# Patient Record
Sex: Female | Born: 1985 | Race: Black or African American | Hispanic: No | Marital: Single | Smoking: Former smoker
Health system: Southern US, Community
[De-identification: ages and names within clinical notes are randomized; demographics above are authoritative.]

## PROBLEM LIST (undated history)

## (undated) ENCOUNTER — Inpatient Hospital Stay (HOSPITAL_COMMUNITY): Payer: Self-pay

## (undated) DIAGNOSIS — J302 Other seasonal allergic rhinitis: Secondary | ICD-10-CM

## (undated) DIAGNOSIS — O139 Gestational [pregnancy-induced] hypertension without significant proteinuria, unspecified trimester: Secondary | ICD-10-CM

## (undated) DIAGNOSIS — M797 Fibromyalgia: Secondary | ICD-10-CM

## (undated) DIAGNOSIS — Z7289 Other problems related to lifestyle: Secondary | ICD-10-CM

## (undated) DIAGNOSIS — F419 Anxiety disorder, unspecified: Secondary | ICD-10-CM

## (undated) DIAGNOSIS — R569 Unspecified convulsions: Secondary | ICD-10-CM

## (undated) DIAGNOSIS — M1990 Unspecified osteoarthritis, unspecified site: Secondary | ICD-10-CM

## (undated) DIAGNOSIS — J40 Bronchitis, not specified as acute or chronic: Secondary | ICD-10-CM

## (undated) DIAGNOSIS — R51 Headache: Secondary | ICD-10-CM

## (undated) DIAGNOSIS — N12 Tubulo-interstitial nephritis, not specified as acute or chronic: Secondary | ICD-10-CM

## (undated) DIAGNOSIS — T391X1A Poisoning by 4-Aminophenol derivatives, accidental (unintentional), initial encounter: Secondary | ICD-10-CM

## (undated) DIAGNOSIS — R519 Headache, unspecified: Secondary | ICD-10-CM

## (undated) HISTORY — PX: DILATION AND CURETTAGE OF UTERUS: SHX78

## (undated) HISTORY — PX: OTHER SURGICAL HISTORY: SHX169

---
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
## 2006-01-13 ENCOUNTER — Emergency Department (HOSPITAL_COMMUNITY): Admission: EM | Admit: 2006-01-13 | Discharge: 2006-01-14 | Payer: Self-pay | Admitting: Emergency Medicine

## 2006-11-21 ENCOUNTER — Emergency Department (HOSPITAL_COMMUNITY): Admission: EM | Admit: 2006-11-21 | Discharge: 2006-11-21 | Payer: Self-pay | Admitting: Emergency Medicine

## 2006-12-24 IMAGING — US US OB FOLLOW-UP
1 series · 13 of 28 positions shown · non-contrast
Comparison: none

CLINICAL DATA: Assess growth.

[Series 1: us ob follow-up · 0.37mm/px · 13 of 35 slices shown]
[im 2/35]
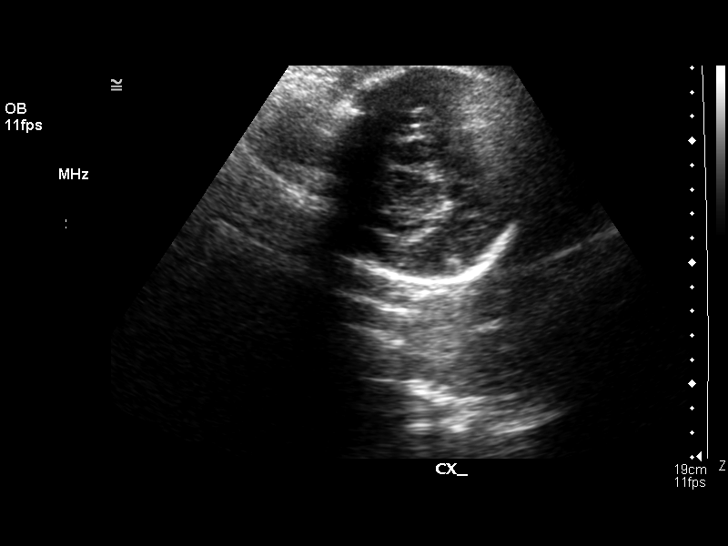
[im 4/35]
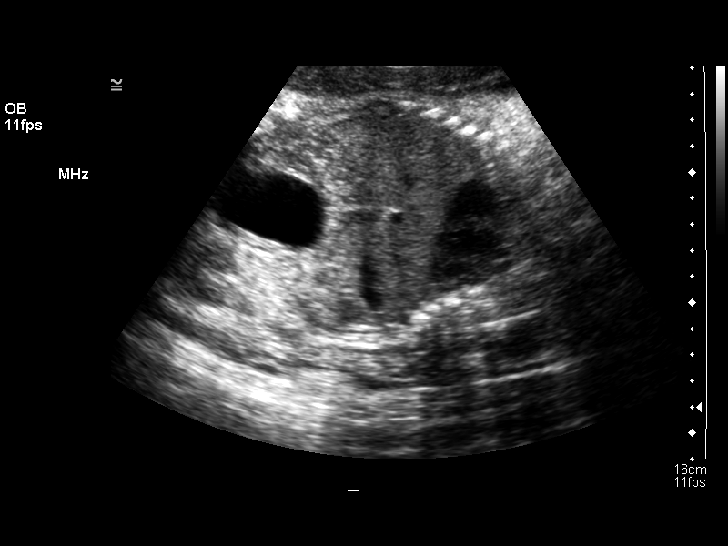
[im 7/35]
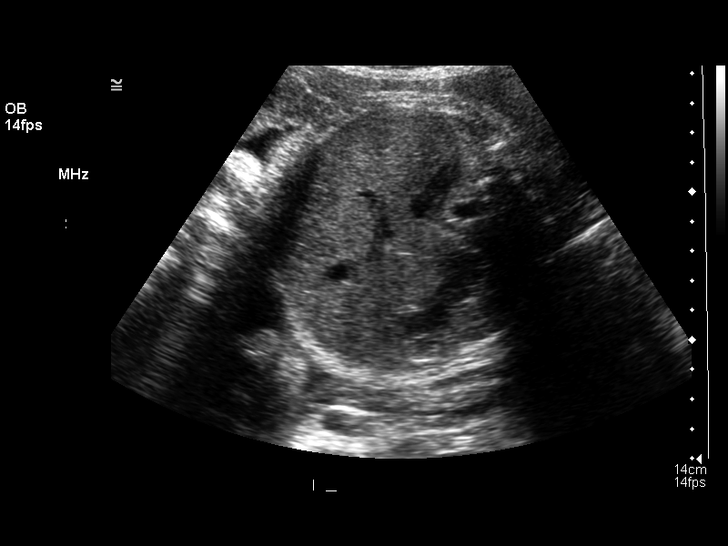
[im 9/35]
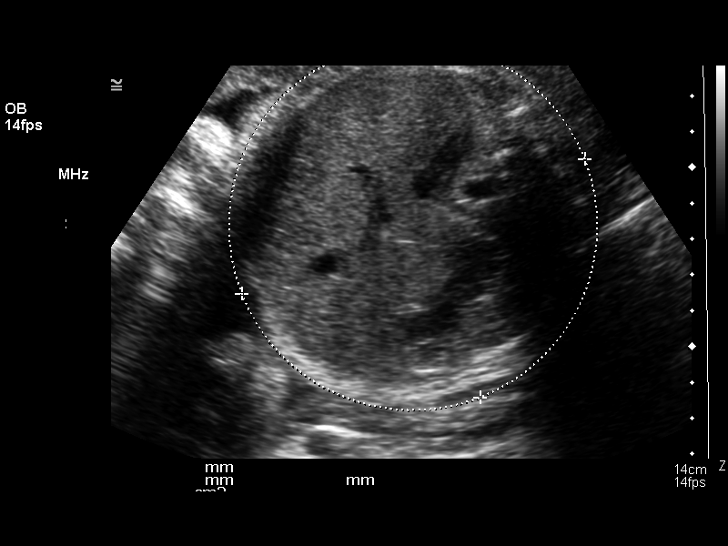
[im 12/35]
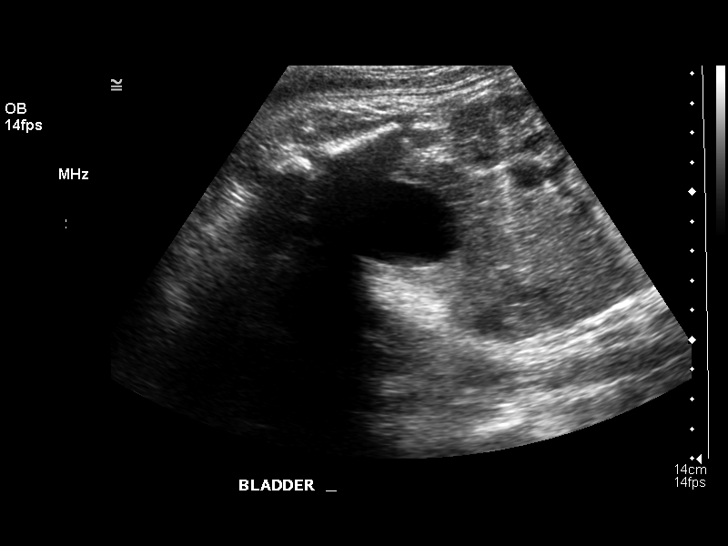
[im 14/35]
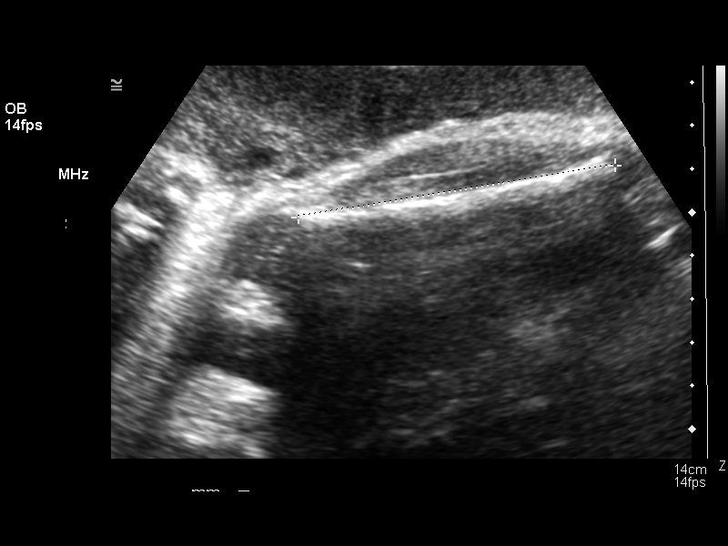
[im 18/35]
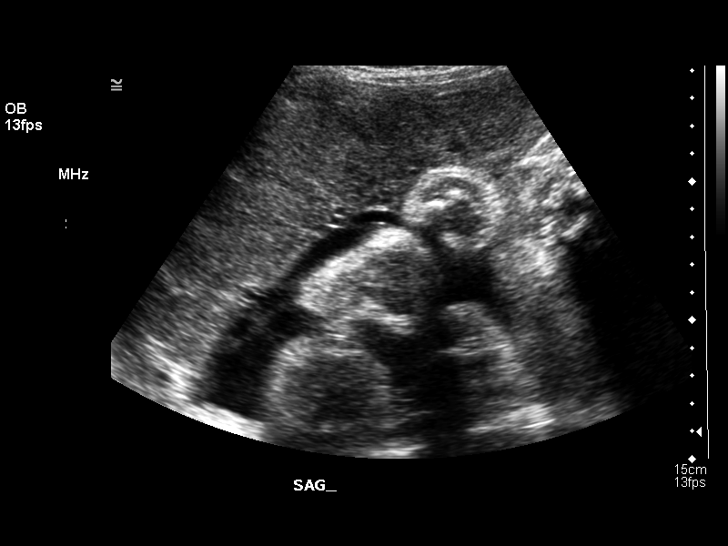
[im 21/35]
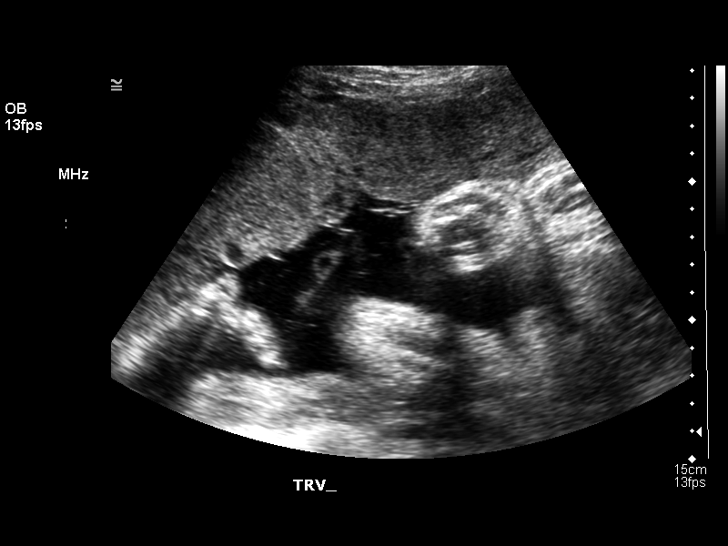
[im 23/35]
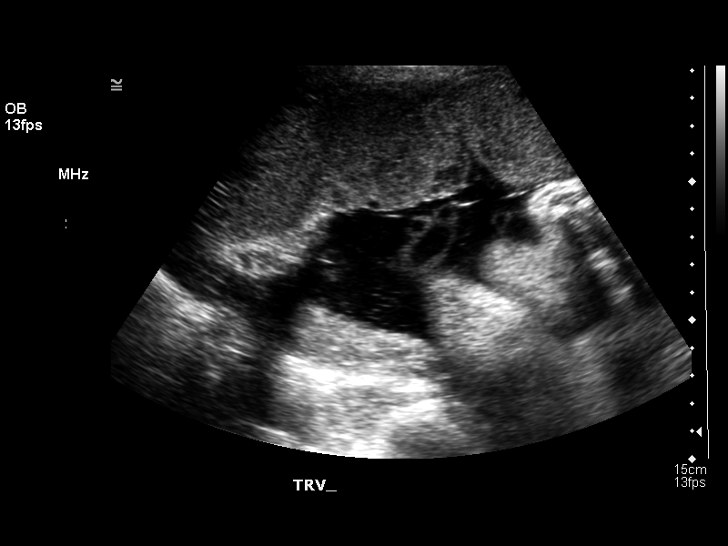
[im 26/35]
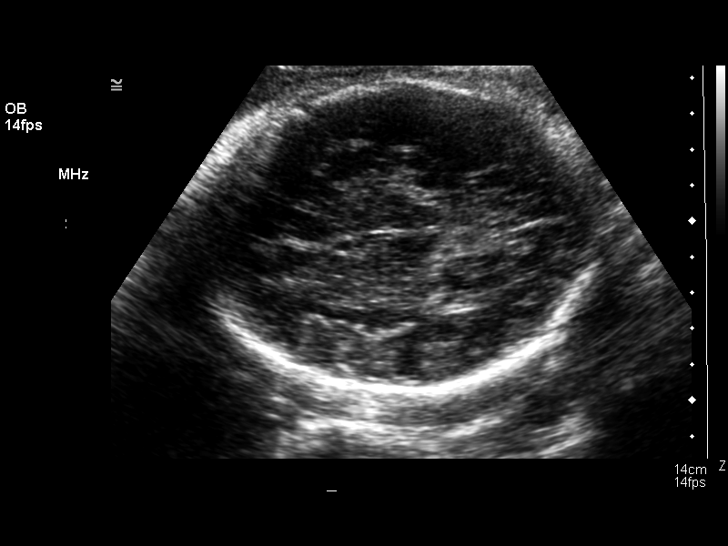
[im 28/35]
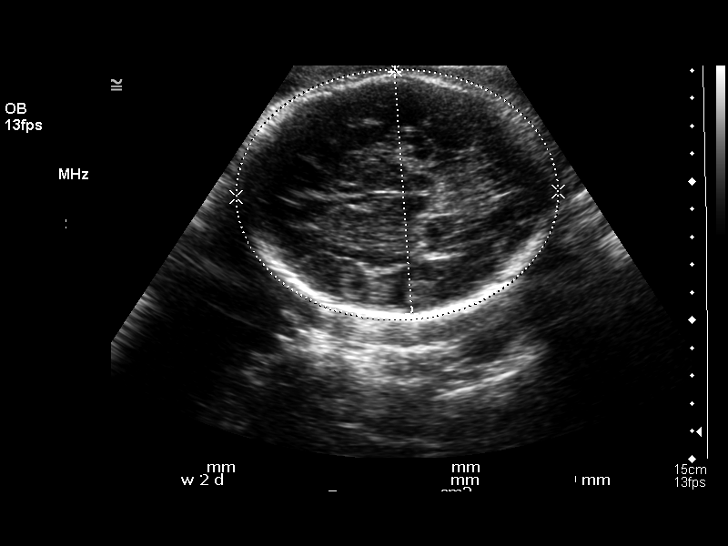
[im 31/35]
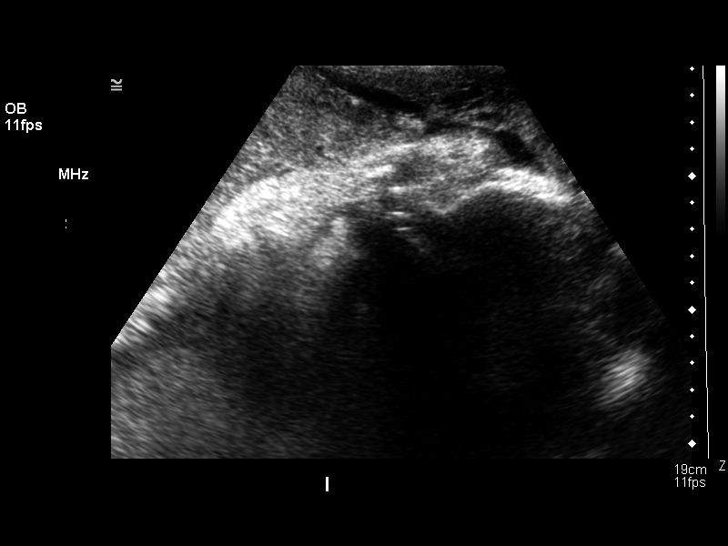
[im 33/35]
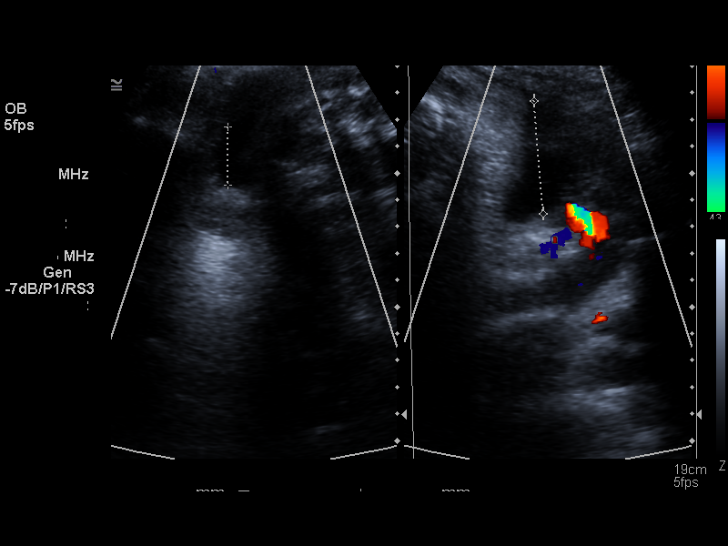

[13 of 28 positions shown; findings below may reference images not displayed]

OBSTETRICAL ULTRASOUND RE-EVALUATION:
Number of Fetuses:  1
Heart Rate:  147
Movement:  Yes
Breathing:  No
Presentation:  Cephalic
Placental Location:  Anterior
Grade:  II
Previa:  No
Amniotic Fluid (subjective):  Normal
Amniotic Fluid (objective):  14.1 cm AFI (5th -95th%ile = 7.7 ? 24.9 cm for 36 wks)

FETAL BIOMETRY
BPD:  8.7 cm   35 w 1 d
HC:  32.4 cm   36 w 4 d
AC:  32.2 cm   36 w 1 d
FL:  7.4 cm   37 w 6 d

Mean GA:  36 w 3 d
Assigned GA:  36 w 3 d

EFW:  0228 g (H) 75th ? 90th%ile (9999 ? 4349 g) For 36 wks

FETAL ANATOMY
Lateral Ventricles:  Visualized 
Thalami/CSP:  Visualized 
Posterior Fossa:  Previously seen 
Nuchal Region:  N/A
Spine:  Previously seen 
4 Chamber Heart on Left:  Previously seen 
Stomach on Left:  Visualized 
3 Vessel Cord:  Visualized 
Cord Insertion Site:  Previously seen 
Kidneys:  Visualized 
Bladder:  Visualized 
Extremities:  Previously seen 

MATERNAL UTERINE AND ADNEXAL FINDINGS
Cervix:  4.0 cm Transabdominally
IMPRESSION: 1.  Single intrauterine pregnancy demonstrating an estimated gestational age by ultrasound of 36 weeks and 3 days.  Correlation with assigned gestational age of 36 weeks and 3 days by office ultrasound suggests appropriate growth.  Currently the estimated fetal weight is just above the 75th percentile for a 36 week gestation.

2.  Subjectively and quantitatively normal amniotic fluid volume and normal cervical length.
3.  No late developing fetal anatomic abnormalities are identified associated with the lateral ventricles, stomach, kidneys or bladder.  A four chamber heart view could not be seen with clarity due to positioning on today?s exam.

## 2007-05-11 ENCOUNTER — Inpatient Hospital Stay (HOSPITAL_COMMUNITY): Admission: AD | Admit: 2007-05-11 | Discharge: 2007-05-11 | Payer: Self-pay | Admitting: Obstetrics and Gynecology

## 2007-07-15 ENCOUNTER — Emergency Department (HOSPITAL_COMMUNITY): Admission: EM | Admit: 2007-07-15 | Discharge: 2007-07-15 | Payer: Self-pay | Admitting: Emergency Medicine

## 2007-09-20 ENCOUNTER — Ambulatory Visit (HOSPITAL_COMMUNITY): Admission: RE | Admit: 2007-09-20 | Discharge: 2007-09-20 | Payer: Self-pay | Admitting: Obstetrics

## 2007-10-26 ENCOUNTER — Ambulatory Visit (HOSPITAL_COMMUNITY): Admission: RE | Admit: 2007-10-26 | Discharge: 2007-10-26 | Payer: Self-pay | Admitting: Obstetrics

## 2007-11-21 ENCOUNTER — Inpatient Hospital Stay (HOSPITAL_COMMUNITY): Admission: AD | Admit: 2007-11-21 | Discharge: 2007-11-22 | Payer: Self-pay | Admitting: Obstetrics

## 2008-05-13 ENCOUNTER — Emergency Department (HOSPITAL_COMMUNITY): Admission: EM | Admit: 2008-05-13 | Discharge: 2008-05-13 | Payer: Self-pay | Admitting: Emergency Medicine

## 2008-08-13 ENCOUNTER — Inpatient Hospital Stay (HOSPITAL_COMMUNITY): Admission: AD | Admit: 2008-08-13 | Discharge: 2008-08-13 | Payer: Self-pay | Admitting: Obstetrics & Gynecology

## 2008-12-12 ENCOUNTER — Ambulatory Visit: Payer: Self-pay | Admitting: Family Medicine

## 2008-12-12 ENCOUNTER — Encounter (INDEPENDENT_AMBULATORY_CARE_PROVIDER_SITE_OTHER): Payer: Self-pay | Admitting: *Deleted

## 2008-12-13 ENCOUNTER — Ambulatory Visit: Payer: Self-pay | Admitting: Family Medicine

## 2008-12-13 ENCOUNTER — Encounter: Payer: Self-pay | Admitting: Family Medicine

## 2008-12-13 ENCOUNTER — Ambulatory Visit (HOSPITAL_COMMUNITY): Admission: AD | Admit: 2008-12-13 | Discharge: 2008-12-13 | Payer: Self-pay | Admitting: Obstetrics and Gynecology

## 2008-12-13 ENCOUNTER — Encounter: Payer: Self-pay | Admitting: *Deleted

## 2008-12-20 ENCOUNTER — Encounter: Payer: Self-pay | Admitting: Family Medicine

## 2008-12-20 ENCOUNTER — Ambulatory Visit: Payer: Self-pay | Admitting: Family Medicine

## 2008-12-20 DIAGNOSIS — R569 Unspecified convulsions: Secondary | ICD-10-CM

## 2008-12-20 LAB — CONVERTED CEMR LAB
Beta hcg, urine, semiquantitative: POSITIVE
Chlamydia, DNA Probe: NEGATIVE
Cholesterol: 156 mg/dL (ref 0–200)
LDL Cholesterol: 92 mg/dL (ref 0–99)
Triglycerides: 48 mg/dL (ref <150)
VLDL: 10 mg/dL (ref 0–40)
Whiff Test: NEGATIVE

## 2008-12-23 ENCOUNTER — Encounter: Payer: Self-pay | Admitting: Family Medicine

## 2009-01-02 ENCOUNTER — Encounter: Payer: Self-pay | Admitting: Family Medicine

## 2009-01-22 ENCOUNTER — Telehealth: Payer: Self-pay | Admitting: *Deleted

## 2009-01-30 ENCOUNTER — Encounter: Payer: Self-pay | Admitting: *Deleted

## 2009-03-20 ENCOUNTER — Emergency Department (HOSPITAL_COMMUNITY): Admission: EM | Admit: 2009-03-20 | Discharge: 2009-03-20 | Payer: Self-pay | Admitting: Emergency Medicine

## 2009-03-31 ENCOUNTER — Emergency Department (HOSPITAL_COMMUNITY): Admission: EM | Admit: 2009-03-31 | Discharge: 2009-03-31 | Payer: Self-pay | Admitting: Emergency Medicine

## 2009-04-06 ENCOUNTER — Emergency Department (HOSPITAL_COMMUNITY): Admission: EM | Admit: 2009-04-06 | Discharge: 2009-04-06 | Payer: Self-pay | Admitting: Emergency Medicine

## 2009-05-24 ENCOUNTER — Encounter (INDEPENDENT_AMBULATORY_CARE_PROVIDER_SITE_OTHER): Payer: Self-pay | Admitting: *Deleted

## 2009-05-24 DIAGNOSIS — F17200 Nicotine dependence, unspecified, uncomplicated: Secondary | ICD-10-CM | POA: Insufficient documentation

## 2009-06-09 ENCOUNTER — Ambulatory Visit: Payer: Self-pay | Admitting: Family Medicine

## 2009-06-09 LAB — CONVERTED CEMR LAB: Beta hcg, urine, semiquantitative: NEGATIVE

## 2009-08-26 ENCOUNTER — Ambulatory Visit: Payer: Self-pay | Admitting: Family Medicine

## 2009-08-26 ENCOUNTER — Encounter: Payer: Self-pay | Admitting: Family Medicine

## 2009-08-26 LAB — CONVERTED CEMR LAB
Beta hcg, urine, semiquantitative: NEGATIVE
Chlamydia, DNA Probe: NEGATIVE

## 2009-09-07 IMAGING — US US OB FOLLOW-UP
1 series · 14 of 24 positions shown · non-contrast
Comparison: none

OBSTETRICAL ULTRASOUND:
 This ultrasound was performed in The [HOSPITAL], and the [facility] report will be stored to [REDACTED] PACS.

[Series 1: us ob follow-up · 14 of 24 slices shown]
[im 1/24]
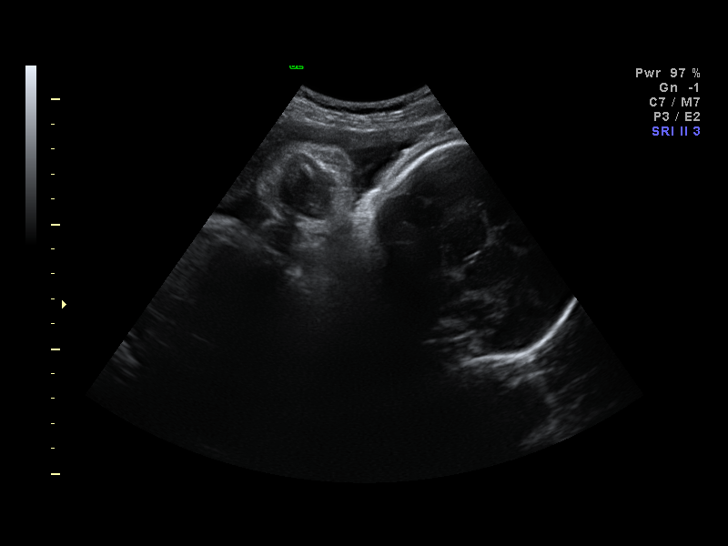
[im 3/24]
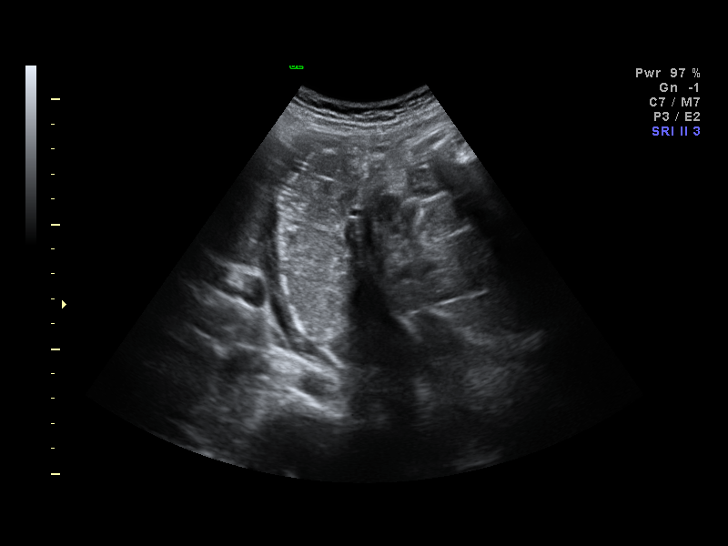
[im 5/24]
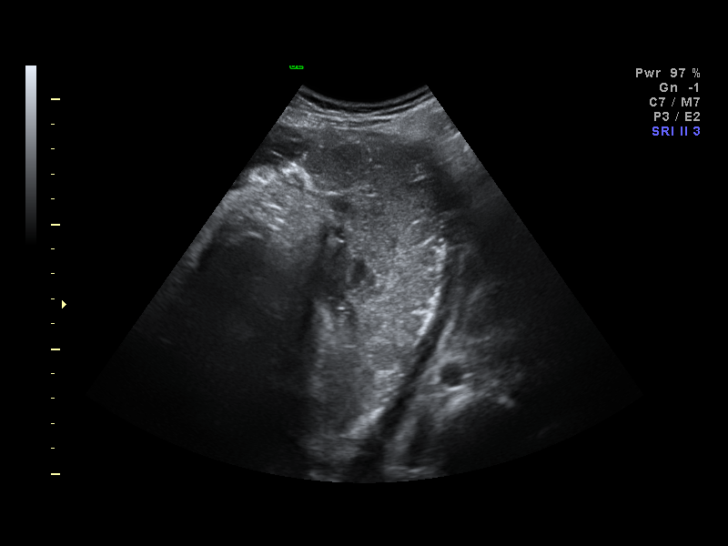
[im 7/24]
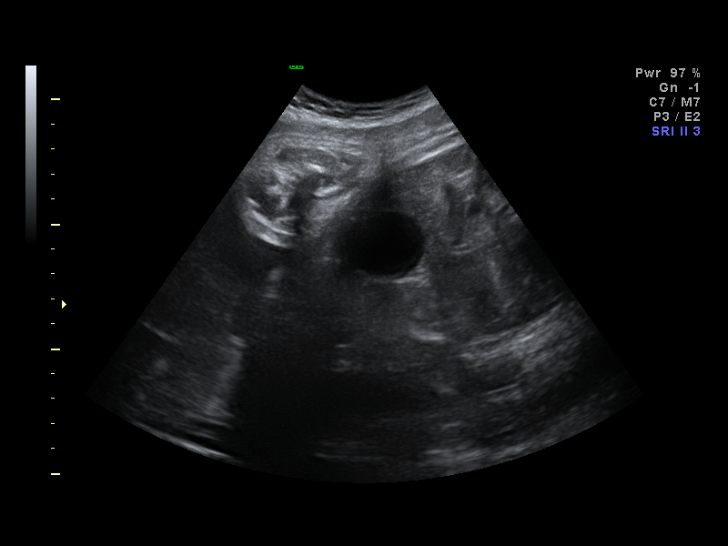
[im 8/24]
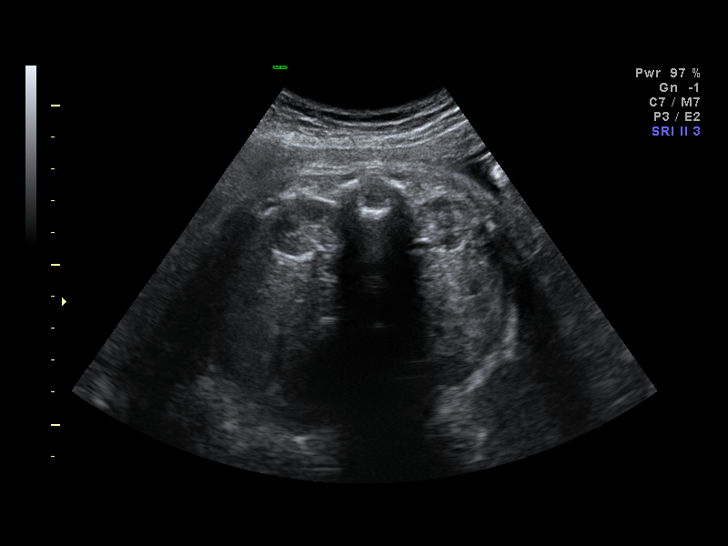
[im 10/24]
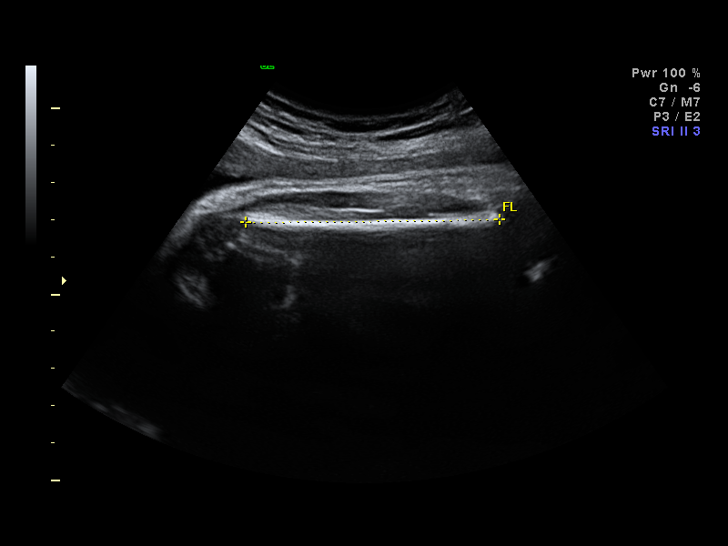
[im 12/24]
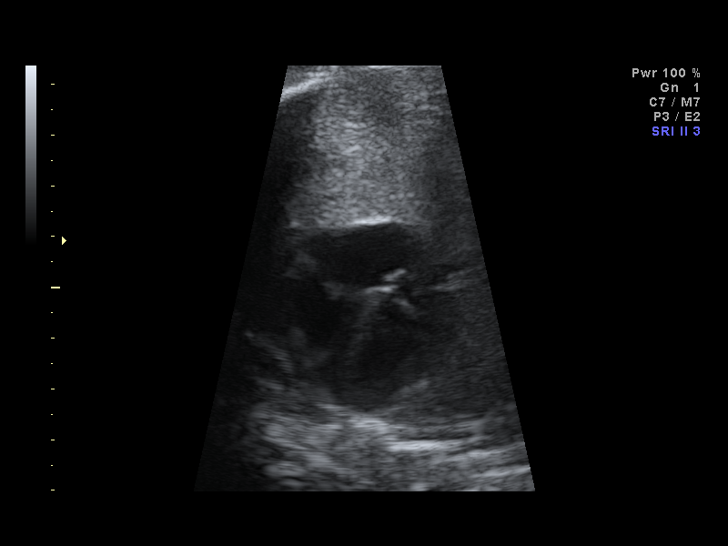
[im 13/24]
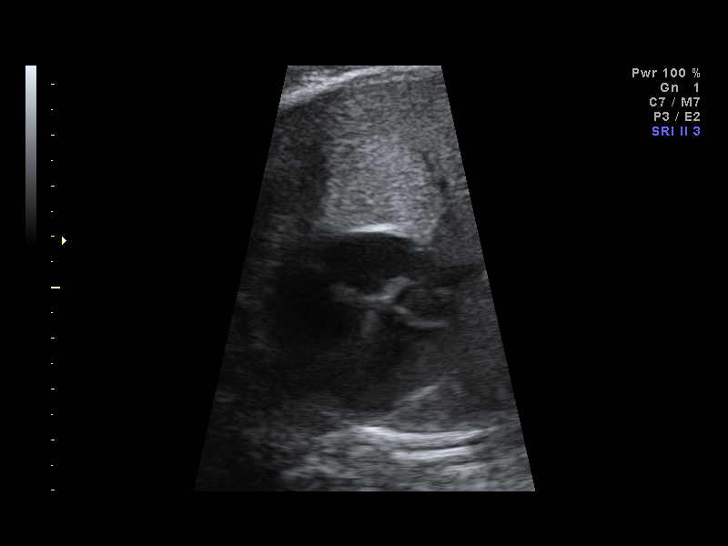
[im 15/24]
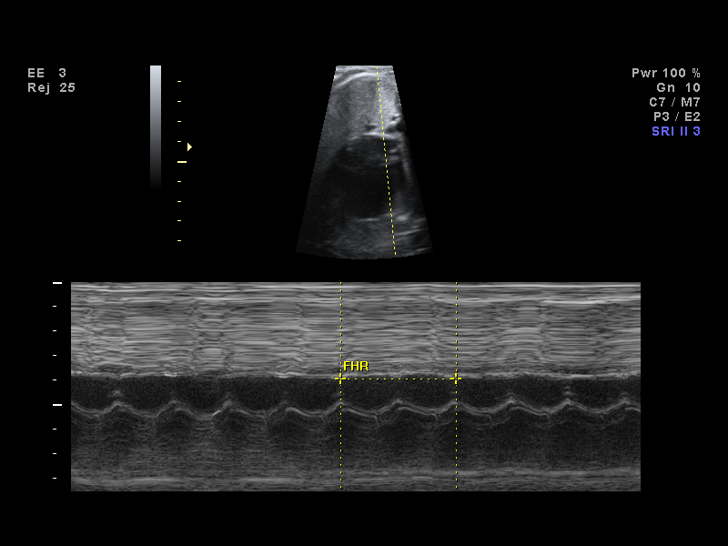
[im 17/24]
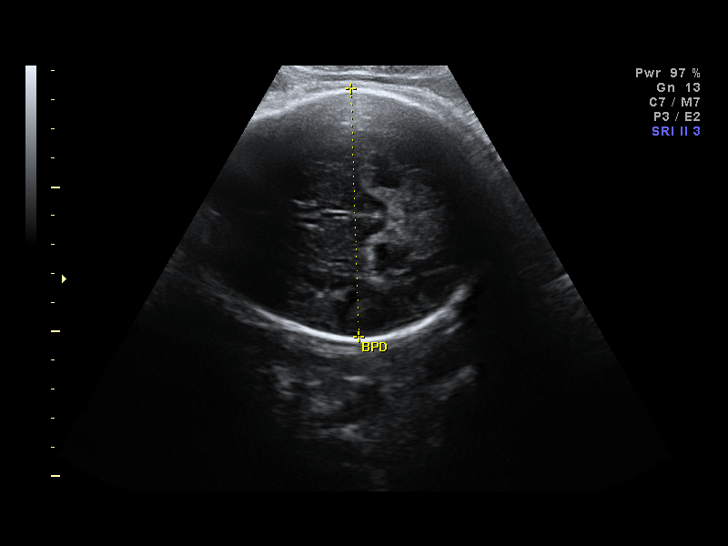
[im 19/24]
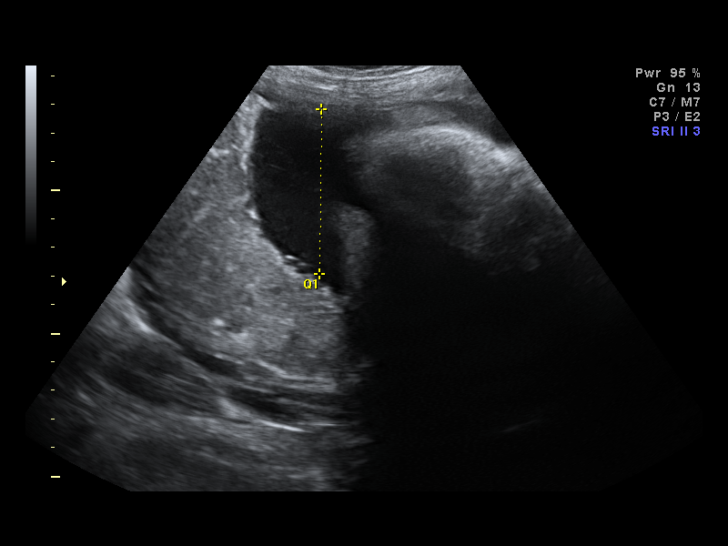
[im 20/24]
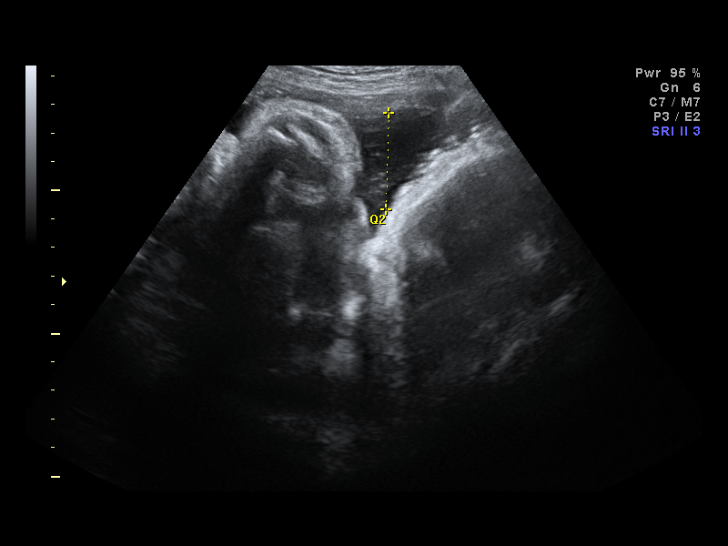
[im 22/24]
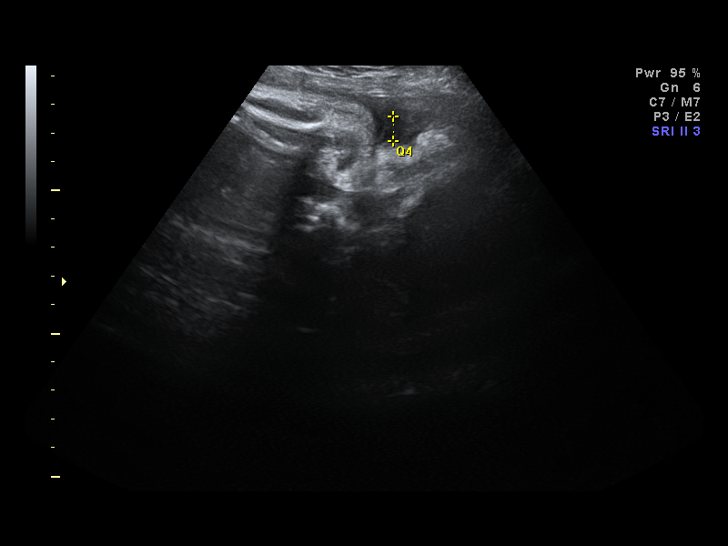
[im 24/24]
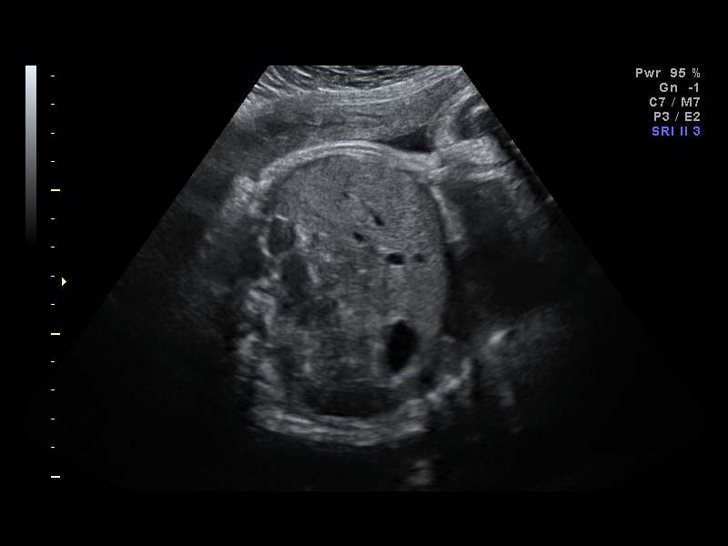

[14 of 24 positions shown; findings below may reference images not displayed]

IMPRESSION: The [facility] report has also been faxed to the ordering physician.

## 2009-10-10 ENCOUNTER — Encounter: Payer: Self-pay | Admitting: Family Medicine

## 2009-10-14 ENCOUNTER — Emergency Department (HOSPITAL_COMMUNITY): Admission: EM | Admit: 2009-10-14 | Discharge: 2009-10-14 | Payer: Self-pay | Admitting: Emergency Medicine

## 2009-11-10 ENCOUNTER — Telehealth: Payer: Self-pay | Admitting: Family Medicine

## 2009-11-26 ENCOUNTER — Ambulatory Visit: Payer: Self-pay | Admitting: Family Medicine

## 2009-11-26 DIAGNOSIS — R519 Headache, unspecified: Secondary | ICD-10-CM | POA: Insufficient documentation

## 2009-11-26 DIAGNOSIS — R51 Headache: Secondary | ICD-10-CM

## 2009-12-16 ENCOUNTER — Telehealth: Payer: Self-pay | Admitting: Family Medicine

## 2009-12-17 ENCOUNTER — Ambulatory Visit: Payer: Self-pay | Admitting: Family Medicine

## 2009-12-17 ENCOUNTER — Encounter: Payer: Self-pay | Admitting: Family Medicine

## 2009-12-17 DIAGNOSIS — J309 Allergic rhinitis, unspecified: Secondary | ICD-10-CM | POA: Insufficient documentation

## 2009-12-17 DIAGNOSIS — E049 Nontoxic goiter, unspecified: Secondary | ICD-10-CM | POA: Insufficient documentation

## 2009-12-18 LAB — CONVERTED CEMR LAB: TSH: 0.673 microintl units/mL (ref 0.350–4.500)

## 2010-03-05 ENCOUNTER — Emergency Department (HOSPITAL_COMMUNITY): Admission: EM | Admit: 2010-03-05 | Discharge: 2010-03-05 | Payer: Self-pay | Admitting: Family Medicine

## 2010-04-05 ENCOUNTER — Emergency Department (HOSPITAL_COMMUNITY): Admission: EM | Admit: 2010-04-05 | Discharge: 2010-04-05 | Payer: Self-pay | Admitting: Emergency Medicine

## 2010-04-06 ENCOUNTER — Telehealth: Payer: Self-pay | Admitting: Family Medicine

## 2010-04-07 ENCOUNTER — Encounter: Payer: Self-pay | Admitting: *Deleted

## 2010-04-07 ENCOUNTER — Telehealth: Payer: Self-pay | Admitting: Family Medicine

## 2010-04-09 ENCOUNTER — Encounter: Payer: Self-pay | Admitting: *Deleted

## 2010-04-28 ENCOUNTER — Ambulatory Visit: Payer: Self-pay | Admitting: Family Medicine

## 2010-04-28 ENCOUNTER — Other Ambulatory Visit: Admission: RE | Admit: 2010-04-28 | Discharge: 2010-04-28 | Payer: Self-pay | Admitting: Family Medicine

## 2010-04-28 ENCOUNTER — Encounter: Payer: Self-pay | Admitting: Family Medicine

## 2010-04-28 DIAGNOSIS — S91309A Unspecified open wound, unspecified foot, initial encounter: Secondary | ICD-10-CM | POA: Insufficient documentation

## 2010-04-28 DIAGNOSIS — N760 Acute vaginitis: Secondary | ICD-10-CM | POA: Insufficient documentation

## 2010-04-28 LAB — CONVERTED CEMR LAB
Chlamydia, DNA Probe: NEGATIVE
GC Probe Amp, Genital: NEGATIVE
Pap Smear: NEGATIVE

## 2010-05-12 ENCOUNTER — Ambulatory Visit: Payer: Self-pay | Admitting: Family Medicine

## 2010-05-12 ENCOUNTER — Emergency Department (HOSPITAL_COMMUNITY): Admission: EM | Admit: 2010-05-12 | Discharge: 2010-05-12 | Payer: Self-pay | Admitting: Emergency Medicine

## 2010-05-12 ENCOUNTER — Encounter: Payer: Self-pay | Admitting: Family Medicine

## 2010-07-17 ENCOUNTER — Telehealth (INDEPENDENT_AMBULATORY_CARE_PROVIDER_SITE_OTHER): Payer: Self-pay | Admitting: *Deleted

## 2010-07-17 ENCOUNTER — Emergency Department (HOSPITAL_COMMUNITY): Admission: EM | Admit: 2010-07-17 | Discharge: 2010-07-17 | Payer: Self-pay | Admitting: Emergency Medicine

## 2010-07-26 ENCOUNTER — Emergency Department (HOSPITAL_COMMUNITY): Admission: EM | Admit: 2010-07-26 | Discharge: 2010-07-26 | Payer: Self-pay | Admitting: Emergency Medicine

## 2010-09-22 ENCOUNTER — Ambulatory Visit: Admit: 2010-09-22 | Payer: Self-pay

## 2010-10-04 ENCOUNTER — Encounter: Payer: Self-pay | Admitting: Obstetrics

## 2010-10-13 NOTE — Progress Notes (Signed)
Summary: triage  Phone Note Call from Patient Call back at Home Phone [phone]   Caller: Patient Summary of Call: Pt cut foot and has stitches.  She says the pain med the hospital gave her is not helping. Initial call taken by: Clydell Hakim,  April 07, 2010 12:01 PM  Follow-up for Phone Call        states foot hurts "real bad" says tramadol makes her fall asleep. has tried ibuprofen. states it does not help.  wound looks ok per pt. denies signs of infections. states it is hard to sit with leg up & rest. has stairs in home. 3 kids are in daycare until 3pm. then she is up & moving. appt made for tomorrow at 8:30am to see md. meantime, keep taking the med & keep foot elevated as much as possible Follow-up by: Golden Circle RN,  April 07, 2010 12:07 PM

## 2010-10-13 NOTE — Assessment & Plan Note (Signed)
Summary: c/o "bronchitis"/[place]/Kalana Yust   Vital Signs:  Patient profile:   25 year old female Height:      64 inches Weight:      162.5 pounds BMI:     27.99 Temp:     98.2 degrees F Pulse rate:   88 / minute BP sitting:   125 / 91  Vitals Entered By: Garen Grams LPN (May 12, 2010 12:14 PM)  Primary Care Provider:  Bobby Rumpf  MD  CC:  cough.  History of Present Illness: 1) Cough: Cough with clear to yellow sputum x 1 week. Sick contact = children. Has not tried any over the counter medications for this. Also reports some chest pain with coughing, as well as shortness of breath when she has been coughing a lot. Went to the Urgent Care because she "thought the wait would  be shorter there" and she "knew we wouldn't give her anything for pain here except Tylenol or ibuprofen". Has continued to smoke during these symptoms.   ROS: Denies wheeze, fever, nausea, vomiting, diarrhea, abdominal pain, rash, sore throat, appetite change  Habits & Providers  Alcohol-Tobacco-Diet     Tobacco Status: current     Tobacco Counseling: to quit use of tobacco products     Cigarette Packs/Day: 0.25  Allergies (verified): No Known Drug Allergies  Social History: Packs/Day:  0.25  Physical Exam  General:  overweight, no acute distres Head:  no sinus tenderness at frontal or maxillary sinuses Eyes:  no conjunctivitis Nose:  no congestion Mouth:  no erythema or exudate Neck:  no lymphadenopathy   Lungs:  Normal respiratory effort, chest expands symmetrically. Lungs are clear to auscultation, no crackles or wheezes. Occasional cough.  Heart:  Normal rate and regular rhythm. S1 and S2 normal without gallop, murmur, click, rub or other extra sounds. Skin:  no rash    Impression & Recommendations:  Problem # 1:  COUGH (ICD-786.2) Assessment New  Likely viral URI. Normal lung exam. AFVSS. Patient requesting pain medication other than over the counter medications - advised that Tylenol or  Ibuprofen should suffice. Reviewed red flags that would prompt return to care. Advised to quit smoking. Patient pre-contemplative about quitting. Will send albuterol inhaler for reported dyspnea, though no signs of dyspnea on today's exam.   Orders: [facility]- Est Level  3 ([address])  Complete Medication List: 1)  Flonase 50 Mcg/act Susp (Fluticasone propionate) .... 2 sprays each nostril nightly for allergies 2)  Cetirizine Hcl 10 Mg Tabs (Cetirizine hcl) .Marland Kitchen.. 1 by mouth once daily for allergies 3)  Proventil Hfa 108 (90 Base) Mcg/act Aers (Albuterol sulfate) .Marland Kitchen.. 1-2 puffs inhaled q6hrs as needed for shortness of breath  Patient Instructions: 1)  Take Tylenol or ibuprofen as needed for pain.  2)  Use the albuterol (proventil) inhaler as needed for shortness of breath. 3)  You can take over the counter cough medication as needed as well.  4)  Stop smoking especially while you have this cold. 5)  Follow up when you are ready to quit smoking compltely or for any other acute issues  Prescriptions: PROVENTIL HFA 108 (90 BASE) MCG/ACT AERS (ALBUTEROL SULFATE) 1-2 puffs inhaled Q6hrs as needed for shortness of breath  #1 x 0   Entered and Authorized by:   Bobby Rumpf  MD   Signed by:   Bobby Rumpf  MD on 05/12/2010   Method used:   Electronically to        [facility] [phone]* (retail)  [address]       [place], [place]  [address]       Ph: [phone]       Fax: [phone]   RxID:   [id number]

## 2010-10-13 NOTE — Progress Notes (Signed)
Summary: triage  Phone Note Call from Patient Call back at [phone]   Caller: Patient Summary of Call: Pt cut foot and has stiches that were put in by hospital.  Wanting crutches can we give her a script for these and will insurance pay for them. Initial call taken by: Clydell Hakim,  April 06, 2010 9:32 AM  Follow-up for Phone Call        slipped & fell on glass. has 11 stitches. happened sunday. they did not tell her to use crutches.  states she is having to hop around. she is on pain meds & antibiotics. they gave her neosporing to use. states she has to buy more large bandaids & ace wrap. thew the first one out as it was bloody. told her they can be washed & reused. started to make her an appt to get stitches out. she already has one with pcp for a CPE. told her to have md look at stitches then. it may be 1-2 days early to get them out. gave her signs of infection to watch for.  reviewed  care of wound. may shower-no baths. do not soak foot in water or use peroxide. she then needed to know dates of children's WCC. gave them to her & discussed the multiple [facility] for them & herself.   warned her she had to come or give at least 24 hrs notice. told her she will be getting a letter about the multiple [facility]. warned her that continuing to not come or call may result in having to find another office. states she will do better Follow-up by: Golden Circle RN,  April 06, 2010 9:47 AM

## 2010-10-13 NOTE — Assessment & Plan Note (Signed)
Summary: runny nose, eyes itch & watery//carew   Vital Signs:  Patient profile:   25 year old female Height:      65 inches Weight:      155 pounds BMI:     25.89 BSA:     1.78 Temp:     98.3 degrees F Pulse rate:   74 / minute BP sitting:   113 / 74  Vitals Entered By: Jone Baseman CMA (December 17, 2009 10:28 AM) CC: runny nose x 2-3 weeks Is Patient Diabetic? No Pain Assessment Patient in pain? no        Primary Care Provider:  Bobby Rumpf  MD  CC:  runny nose x 2-3 weeks.  History of Present Illness: runny nose: x2-3 wks.  also congested/stopped up at times.  also itchy throat, itchy and watery eyes with sometimes redness and puffiness.  no fevers.  n ocough.  no diarrhea or vomiting or other GI symptoms. hasn't tried any meds.  daughter has similar symptoms.  she has had similar symptoms in the past but cannot remember what helped.      Habits & Providers  Alcohol-Tobacco-Diet     Tobacco Status: current     Tobacco Counseling: to quit use of tobacco products     Cigarette Packs/Day: <0.25  Current Medications (verified): 1)  Flonase 50 Mcg/act Susp (Fluticasone Propionate) .... 2 Sprays Each Nostril Nightly For Allergies 2)  Cetirizine Hcl 10 Mg Tabs (Cetirizine Hcl) .Marland Kitchen.. 1 By Mouth Once Daily For Allergies  Allergies (verified): No Known Drug Allergies  Past History:  PMH reviewed for relevance  Review of Systems       per HPI.  no sinus pressure or pain.  does note some fullness in the ears  Physical Exam  General:  Well-developed,well-nourished,in no acute distress; alert,appropriate and cooperative throughout examination Head:  Normocephalic and atraumatic without obvious abnormalities. No apparent alopecia or balding. Eyes:  mild erythema of conjunctiva.   Ears:  mildly increased bulge of bilateral TMs but with clear fluid and normal cone of light reflex. no erythema. Nose:  clear rhinorrhea.  boggy turbinates Mouth:  pink and moist.  no  tonsillar edema.  mild pharyngeal erythema Neck:  thyromegaly noted symmetrically, nontender.  no dominant nodule noted.  no lymphademopathy noted Lungs:  Normal respiratory effort, chest expands symmetrically. Lungs are clear to auscultation, no crackles or wheezes. Heart:  Normal rate and regular rhythm. S1 and S2 normal without gallop, murmur, click, rub or other extra sounds.   Impression & Recommendations:  Problem # 1:  ALLERGIC RHINITIS (ICD-477.9) Assessment New  start trial of medication, monitor for changing symptoms.   Her updated medication list for this problem includes:    Flonase 50 Mcg/act Susp (Fluticasone propionate) .Marland Kitchen... 2 sprays each nostril nightly for allergies    Cetirizine Hcl 10 Mg Tabs (Cetirizine hcl) .Marland Kitchen... 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  check TSH.  consider possible ultrasound as well - good number to reach patient with results: [phone] Orders: TSH-[facility] ([id number]) [facility]- Est  Level 4 ([address])  Complete Medication List: 1)  Flonase 50 Mcg/act Susp (Fluticasone propionate) .... 2 sprays each nostril nightly for allergies 2)  Cetirizine Hcl 10 Mg Tabs (Cetirizine hcl) .Marland Kitchen.. 1 by mouth once daily for allergies  Patient Instructions: 1)  we  will call you if we need to get further tests on your thyroid.   2)  take your allergy medicine and nasal spray to help your congestion/allergy symptoms. Prescriptions: FLONASE 50 MCG/ACT SUSP (FLUTICASONE PROPIONATE) 2 sprays each nostril nightly for allergies  #1 x 6   Entered and Authorized by:   Ancil Boozer  MD   Signed by:   Ancil Boozer  MD on 12/17/2009   Method used:   Electronically to        [facility] [phone]* (retail)       [address]       [place], [place]  [address]       Ph: [phone]       Fax: [phone]   RxID:    [id number] CETIRIZINE HCL 10 MG TABS (CETIRIZINE HCL) 1 by mouth once daily for allergies  #32 x 6   Entered and Authorized by:   Ancil Boozer  MD   Signed by:   Ancil Boozer  MD on 12/17/2009   Method used:   Electronically to        [facility] [phone]* (retail)       [address]       [place], [place]  [address]       Ph: [phone]       Fax: [phone]   RxID:   [id number]

## 2010-10-13 NOTE — Miscellaneous (Signed)
Summary: she is at [facility]. sent back to [place]  Clinical Lists Changes rec'd call from [facility]. this pt is there for c/o bronchitis. asked them to send her right over as we can put her in the schedule now.Golden Circle RN  May 12, 2010 10:57 AM   will follow up clinic visit when she is seen. Bobby Rumpf  MD  May 12, 2010 12:02 PM

## 2010-10-13 NOTE — Assessment & Plan Note (Signed)
Summary: pregnancy test/depo inj,tcb, headache, 1 week , back pain  3days  Nurse Visit   Vital Signs:  Patient profile:   25 year old female Height:      65 inches Weight:      153 pounds BMI:     25.55 BSA:     1.77 Temp:     98.3 degrees F Pulse rate:   89 / minute BP sitting:   113 / 76  Vitals Entered By: Jone Baseman CMA (November 26, 2009 10:17 AM) CC: headache x 1 week Is Patient Diabetic? No Pain Assessment Patient in pain? yes     Location: head Intensity: 7   Habits & Providers  Alcohol-Tobacco-Diet     Tobacco Status: current     Tobacco Counseling: to quit use of tobacco products     Cigarette Packs/Day: <0.25  Allergies: No Known Drug Allergies  CC:  headache x 1 week.  History of Present Illness: 25 yo female seen as workin for bilateral 7/10 frontal/retroorbital headache x1 week.  Did not start suddenly and is not the worst headache she has ever had.  Associated with phonophobia, but not photophobia or nausea.  No focal weakness or numbness.  Not relieved by Ibuprofen or Acetaminophen.  Review of chart indicates she has h/o seizure disorder, but denies seizure in >1 year.  Laboratory Results   Urine Tests  Date/Time Received: November 26, 2009 9:22 AM  Date/Time Reported: November 26, 2009 9:27 AM     Urine HCG: negative Comments: ...............test performed by......Marland KitchenBonnie A. [place], MLS (ASCP)cm     Medication Administration  Injection # 1:    Medication: Depo-Provera 150mg     Diagnosis: CONTRACEPTIVE MANAGEMENT (ICD-V25.09)    Route: IM    Site: LUOQ gluteus    Exp Date: 01/2012    Lot #: [id number]    Mfr: [email or web address]    Comments: next depo due June 1 thru February 25, 2010.    Patient tolerated injection without complications    Given by: Theresia Lo RN (November 26, 2009 9:40 AM)  Orders Added: 1)  U Preg-[facility] [81025] 2)  Depo-Provera 150mg  [J1055] 3)  [facility]- Est  Level 4 [[address]]  Physical Exam  General:   Well-developed,well-nourished,in no acute distress; alert,appropriate and cooperative throughout examination, withdrawing from light and sound Eyes:  Fundi poorly visualized Neurologic:  CN II-XII intact, 5/5 strength x4 extremities, 2+ DTRs x4 extremities, normal cerebellar function, no resting or intention tremor.   Impression & Recommendations:  Problem # 1:  HEADACHE (ICD-784.0) Assessment New  Likely migraine.  Normal neuro exam.  Given Benadryl / Compazine / Decadron (25/10/10) IM x1.  Patient has someone to drive her home.  Advised she will be sleepy today.  RTC tomorrow if not better, otherwise f/u with PCP in 1-2 weeks.  Orders: [facility]- Est  Level 4 ([address])  Complete Medication List: 1)  Flonase 50 Mcg/act Susp (Fluticasone propionate) .Marland Kitchen.. 1 inh per nostril daily. 2)  Metronidazole 500 Mg Tabs (Metronidazole) .... Two times a day for 7 days  Other Orders: U Preg-[facility] (37628) Depo-Provera 150mg  (B1517)   Patient Instructions: 1)  Pleasure to meet you today.  Sorry you are not well. 2)  You will feel sleepy today, but should feel better after you have a nap. 3)  If you still have pain tomorrow, please call and ask to be seen again in our clinic. 4)  Please schedule a follow-up appointment in 1-2 weeks with Dr. Wallene Huh.     Medication  Administration  Injection # 1:    Medication: Depo-Provera 150mg     Diagnosis: CONTRACEPTIVE MANAGEMENT (ICD-V25.09)    Route: IM    Site: LUOQ gluteus    Exp Date: 01/2012    Lot #: [id number]    Mfr: [email or web address]    Comments: next depo due June 1 thru February 25, 2010.    Patient tolerated injection without complications    Given by: Theresia Lo RN (November 26, 2009 9:40 AM)  Orders Added: 1)  U Preg-[facility] [81025] 2)  Depo-Provera 150mg  [J1055] 3)  [facility]- Est  Level 4 [04540]  patient reported headache for one week not relieved with tylenol. HA every day .  also complains with low back discomfort for 3 days. this nurse visit changed to office  visit  and put on Dr. Lonell Face schedule . patient reports no sexual activity  for 3 weeks , advised to use extra protection for the next 7 days if she does have sex. Theresia Lo RN  November 26, 2009 9:42 AM  Appended Document: pregnancy test/depo inj,tcb, headache, 1 week , back pain  3days   Medication Administration  Injection # 1:    Medication: Compazine Injection    Diagnosis: HEADACHE (ICD-784.0)    Route: IM    Site: LUOQ gluteus    Exp Date: 01/12/2011    Lot #: [id number]    Mfr: [email or web address]    Comments: 10mg  given    Patient tolerated injection without complications    Given by: Jone Baseman CMA (November 26, 2009 11:15 AM)  Injection # 2:    Medication: Benadryl  IM or IV    Diagnosis: HEADACHE (ICD-784.0)    Route: IM    Site: RUOQ gluteus    Exp Date: 02/12/2011    Lot #: [id number]    Mfr: app    Comments: 25mg  given    Patient tolerated injection without complications    Given by: Jone Baseman CMA (November 26, 2009 11:16 AM)  Injection # 3:    Medication: Dexamethasone Sodium Phosphate 1mg     Diagnosis: HEADACHE (ICD-784.0)    Route: IM    Site: RUOQ gluteus    Exp Date: 10/14/2010    Lot #: [id number]    Mfr: [email or web address]    Comments: 10mg  given    Patient tolerated injection without complications    Given by: Jone Baseman CMA (November 26, 2009 11:16 AM)  Orders Added: 1)  Compazine Injection [J0780] 2)  Benadryl  IM or IV [J1200] 3)  Dexamethasone Sodium Phosphate 1mg  [J1100]

## 2010-10-13 NOTE — Progress Notes (Signed)
Summary: triage  Phone Note Call from Patient   Caller: Patient Summary of Call: pt was exposed to whooping cough on Monday and wants to bring herself and kids in to be checked.   Initial call taken by: De Nurse,  July 17, 2010 1:38 PM  Follow-up for Phone Call        spoke with triage nurse and she said she would need to go to UC since we have no available appts.  pt understood Follow-up by: De Nurse,  July 17, 2010 1:39 PM     Appended Document: triage spoke with mother and she states she and her three kids were at a halloween party 07/13/2010. a woman there had  a diagnosis of pneumonia . today [place] received a call ffromthis woman stating the hospital  called her and advised her that she has whooping cough and needs to let anyone that she has been in close contact with know about this diaginosis so they can be checked and treated. RN checked all the children immunization records and they are up to date on all DTaP. [place] states she received a tetanus shot in ER in August . unclear if it was DT or Tdap.  she has had a cough for 2 weeks. the children have had runny noses but no real coughing. consulted with Dr. Deirdre Priest and he advises mother needs to be seen since she is having cough now. but kids do not as long as they did not have direct exposure such as being coughed on. mother notified and she will go to urgent care.

## 2010-10-13 NOTE — Letter (Signed)
Summary: Suspension Letter  [facility]  [address]   [place], [place] [address]   Phone: [phone]  Fax: [phone]    04/09/2010  Kristi Marshall [address] [place], [place]  [address]  Dear Ms. [place]ston,  You have missed 4 scheduled appointments with our practice.If you cannot keep your appointment, we expect you to call and cancel at least 24 hours before your appointment time.  As per our policy, we will now only give you limited medical services. means we will not call in a refill for you, or complete a form or make a referral except when you are here for a scheduled office visit.   If you miss 2 more appointments in the next year, we will dismiss you from our practice.  We hope this does not happen.  If you keep your appointments for the next year you will be returned to regular patient status.  We hope these changes will encourage you to keep your appointments so we may provide you the best medical care.   Our office staff can be reached at [phone] Monday through Friday from 8:30 a.m.-5:00 p.m. and will be glad to schedule your appointment as necessary.     Sincerely,   [facility]    Appended Document: Suspension Letter mailed

## 2010-10-13 NOTE — Progress Notes (Signed)
Summary: Triage  Phone Note Call from Patient Call back at Home Phone [phone]   Reason for Call: Talk to Nurse Summary of Call: mom scheduled wcc appts for her 3 kids, mom wants to know what to do about her and her kids allergies, all of them are having problems. Initial call taken by: Knox Royalty,  December 16, 2009 3:35 PM  Follow-up for Phone Call        mom states she & all 3 children have allergies that started with spring. has not used any OTC since she has no money. all have runny noses & some have red, puffy eyes. 2 to see Dr. Wallene Huh at 10 & 11. other 2 placed in work in schedule Follow-up by: Golden Circle RN,  December 16, 2009 3:39 PM

## 2010-10-13 NOTE — Progress Notes (Signed)
Summary: triage  Phone Note Call from Patient Call back at [phone]   Caller: Patient Summary of Call: Pt says she has something like knot poking out of her stomach.  Had very severe pain with this. Initial call taken by: Clydell Hakim,  November 10, 2009 11:43 AM  Follow-up for Phone Call        when she bends to tie shoes, she gets a pain & "ball" under skin -near bra line on front. yesterday she had 2 balls one under each breast.   they happens when she moves a certain way or bends over describes them as being like a charlie horse. advised pressure with heel of hand & lay down for a few minutes. states she is sore after this happens. told her to take tylenol or ibuprofen appt tomorrrow at 8:30. she agreed with plan Follow-up by: Golden Circle RN,  November 10, 2009 11:50 AM

## 2010-10-13 NOTE — Assessment & Plan Note (Signed)
Summary: cpe/pap,tcb   Vital Signs:  Patient profile:   25 year old female Height:      64.5 inches Weight:      161 pounds BMI:     27.31 BSA:     1.80 Temp:     98.1 degrees F Pulse rate:   117 / minute BP sitting:   137 / 81  Vitals Entered By: Jone Baseman CMA (April 28, 2010 10:10 AM) CC: CPP Is Patient Diabetic? No Pain Assessment Patient in pain? no        Primary Care Provider:  Bobby Rumpf  MD  CC:  CPP.  History of Present Illness: 1) Vaginal itching: Mild occasional itching. History of BV. Denies vaginal discharge, fever, abdominal pain, nausea, dysuria. LMP was 03/28/10  2) Left foot wound: Seen at ER on 7/24 for a laceration injury to the dorsilateral aspect of her left foot (slipped and fell on broken glass). Wound was repaired with nylon suture - patient was informed that she needed to return in 10 days for suture removal. Did not return to have sutures removed - "I just didn't go". Has been experiencing numbness / tingling in lateral two toes since injury. Still some mild swelling at site of wound but greatly improved. Has been putting neopsorin on wound daily.   Denies fever, chills, purulent drainage, inability to move toes.   3) Tobacco use: 1/4 pack per day. Precontemplative about quitting.   4) Prevention: Pap today.   Habits & Providers  Alcohol-Tobacco-Diet     Tobacco Status: current     Tobacco Counseling: to quit use of tobacco products     Cigarette Packs/Day: <0.25  Current Medications (verified): 1)  Flonase 50 Mcg/act Susp (Fluticasone Propionate) .... 2 Sprays Each Nostril Nightly For Allergies 2)  Cetirizine Hcl 10 Mg Tabs (Cetirizine Hcl) .Marland Kitchen.. 1 By Mouth Once Daily For Allergies  Allergies (verified): No Known Drug Allergies  Physical Exam  General:  overweight, no acute distress but apprehensive about Pap Head:  normocephalic and atraumatic.   Eyes:  pupils equal, round and reactive to light , extraoccular movements intact    Nose:  no external deformity.   Mouth:  Oral mucosa and oropharynx without lesions or exudates.  Teeth in good repair. Lungs:  Normal respiratory effort, chest expands symmetrically. Lungs are clear to auscultation, no crackles or wheezes. Heart:  Normal rate and regular rhythm. S1 and S2 normal without gallop, murmur, click, rub or other extra sounds. Abdomen:  Bowel sounds positive,abdomen soft and non-tender without masses, organomegaly or hernias noted. Genitalia:  Pelvic Exam:        External: normal female genitalia without lesions or masses        Vagina: normal without lesions or masses        Cervix: normal without lesions or masses        Adnexa: normal bimanual exam without masses or fullness        Uterus: normal by palpation        Pap smear: performed Pulses:  2+ pedal Extremities:  Well healing 4 cm laceration at dorilateral aspect of left foot (longitudinal as compared to axis of foot) with well healing 2 cm laceration approximately 1 cm distal (transverse as compared to axis of foot) with blue nylon suture. Mild edema surrounding wound without erythema or warmth or purulence or fluctuance  Neurologic:  alert & oriented X3, cranial nerves II-XII intact, and strength normal in all extremities.    ROM wnl  toes bilateral feet diminished sensation dorsal aspect of 4th and 5th toes; otherwise sensation normal throughout.    Impression & Recommendations:  Problem # 1:  LACERATION, FOOT (ICD-892.0) Assessment New Removed sutures without difficulty. Wound healing well, though with minor cosmetic defect likely secondary to stitches remaining in place for such an extended period of time. Advised to continue to apply neosporin daily. Loss of sensation likely secondary to laceration injury to nerve - advised regarding rate of return of full sensation to area. Vascular intact, motor intact, no signs of tendon involvement.   Problem # 2:  Preventive Health Care (ICD-V70.0)  Pap today.    Orders: [facility] - Est  18-39 yrs (04540)  Problem # 3:  TOBACCO USER (ICD-305.1) Precontemplative. Will re-address readiness to quit at next visit.  Problem # 4:  UNSPECIFIED VAGINITIS AND VULVOVAGINITIS (ICD-616.10)  Counselled on safe sex. GC/Chlamydia pending.  Wet prep with bacterial vaginosis. Will treat with Flagyl x  7 days as below.   Her updated medication list for this problem includes:    Metronidazole 500 Mg Tabs (Metronidazole) ..... One tab by mouth two times a day x 7 days  Orders: Wet Prep- [facility] ([phone]) GC/Chlamydia-[facility] ([id number])  Complete Medication List: 1)  Flonase 50 Mcg/act Susp (Fluticasone propionate) .... 2 sprays each nostril nightly for allergies 2)  Cetirizine Hcl 10 Mg Tabs (Cetirizine hcl) .Marland Kitchen.. 1 by mouth once daily for allergies 3)  Metronidazole 500 Mg Tabs (Metronidazole) .... One tab by mouth two times a day x 7 days  Other Orders: Pap Smear-[facility] ([id number]) Prescriptions: METRONIDAZOLE 500 MG TABS (METRONIDAZOLE) one tab by mouth two times a day x 7 days  #14 x 0   Entered and Authorized by:   Bobby Rumpf  MD   Signed by:   Bobby Rumpf  MD on 04/29/2010   Method used:   Electronically to        [facility] [phone]* (retail)       [address]       [place], [place]  [address]       Ph: [phone]       Fax: [phone]   RxID:   [id number]   Laboratory Results  Date/Time Received: April 28, 2010 10:36 AM  Date/Time Reported: April 28, 2010 10:44 AM   Wet [place] Source: vag WBC/hpf: >20 Bacteria/hpf: 3+  Cocci Clue cells/hpf: many  Positive whiff Yeast/hpf: none Trichomonas/hpf: none Comments: ...............test performed by......Marland KitchenBonnie A. [place], MLS (ASCP)cm

## 2010-10-13 NOTE — Letter (Signed)
Summary: Probation Letter  [facility]  [address]   [place], [place] [address]   Phone: [phone]  Fax: [phone]    04/07/2010  CHELESA WEINGARTNER [address] [place], [place]  [address]  Dear Ms. Acord,  With the goal of better serving all our patients the [facility] is following each patient's missed appointments.  You and your children have missed at least 3 appointments with our practice.If you cannot keep your appointment, we expect you to call at least 24 hours before your appointment time.  Missing appointments prevents other patients from seeing [place] and makes it difficult to provide you with the best possible medical care.      1.   If you miss one more appointment, we will only give you limited medical services. This means we will not call in medication refills, complete a form, or make a referral for you except when you are here for a scheduled office visit.    2.   If you miss 2 or more appointments in the next year, we will dismiss you from our practice.    Our office staff can be reached at [phone] Monday through Friday from 8:30 a.m.-5:00 p.m. and will be glad to schedule your appointment as necessary.    Thank you.   [facility]

## 2010-10-15 NOTE — Letter (Signed)
Summary: External Other  External Other   Imported By: Darius Bump 09/29/2010 16:19:51  _____________________________________________________________________  External Attachment:    Type:   Image     Comment:   External Document

## 2010-10-16 ENCOUNTER — Telehealth: Payer: Self-pay | Admitting: Family Medicine

## 2010-10-17 ENCOUNTER — Inpatient Hospital Stay (HOSPITAL_COMMUNITY): Payer: Medicaid Other

## 2010-10-17 ENCOUNTER — Inpatient Hospital Stay (HOSPITAL_COMMUNITY)
Admission: AD | Admit: 2010-10-17 | Discharge: 2010-10-17 | Disposition: A | Discharge status: Home / Self Care | Payer: Medicaid Other | Source: Ambulatory Visit | Attending: Obstetrics & Gynecology | Admitting: Obstetrics & Gynecology

## 2010-10-17 DIAGNOSIS — R109 Unspecified abdominal pain: Secondary | ICD-10-CM

## 2010-10-17 DIAGNOSIS — O209 Hemorrhage in early pregnancy, unspecified: Secondary | ICD-10-CM | POA: Insufficient documentation

## 2010-10-17 LAB — URINALYSIS, ROUTINE W REFLEX MICROSCOPIC
Bilirubin Urine: NEGATIVE
Ketones, ur: NEGATIVE mg/dL
Protein, ur: NEGATIVE mg/dL
Urine Glucose, Fasting: NEGATIVE mg/dL
pH: 6 (ref 5.0–8.0)

## 2010-10-17 LAB — CBC
HCT: 37.2 % (ref 36.0–46.0)
Hemoglobin: 12.6 g/dL (ref 12.0–15.0)
RBC: 3.91 MIL/uL (ref 3.87–5.11)
WBC: 8.6 10*3/uL (ref 4.0–10.5)

## 2010-10-17 LAB — ABO/RH: ABO/RH(D): AB POS

## 2010-10-17 LAB — HCG, QUANTITATIVE, PREGNANCY: hCG, Beta Chain, Quant, S: 23272 m[IU]/mL — ABNORMAL HIGH (ref <5)

## 2010-10-17 LAB — WET PREP, GENITAL

## 2010-10-21 NOTE — Progress Notes (Signed)
  Phone Note Call from Patient   Caller: Patient Call For: Bobby Rumpf  MD Complaint: Abdominal Pain Details of Complaint: 1 month preg per pt. heavy cramps since last wk. bleeding since last night. not pleased about pregnancy Summary of Call: started bleeding last night. size of a silver dollar on pad per pt. cramping for a wk, but much worse since the bleeding began. advised tylenol & lay down. drink plenty of water. she [facility]. not happy about being preg (took home test). states she will get birth control if this pregnancy does not go to completion. Initial call taken by: Golden Circle RN,  October 16, 2010 8:58 AM

## 2010-11-04 ENCOUNTER — Encounter (INDEPENDENT_AMBULATORY_CARE_PROVIDER_SITE_OTHER): Payer: Medicaid Other | Admitting: Family Medicine

## 2010-11-04 NOTE — Progress Notes (Signed)
This encounter was created in error - please disregard.

## 2010-11-24 LAB — POCT PREGNANCY, URINE: Preg Test, Ur: NEGATIVE

## 2010-11-29 LAB — POCT URINALYSIS DIP (DEVICE)
Protein, ur: NEGATIVE mg/dL
Specific Gravity, Urine: 1.025 (ref 1.005–1.030)
Urobilinogen, UA: 1 mg/dL (ref 0.0–1.0)
pH: 5.5 (ref 5.0–8.0)

## 2010-12-02 LAB — CBC
HCT: 42.5 % (ref 36.0–46.0)
Hemoglobin: 14.2 g/dL (ref 12.0–15.0)
Platelets: 270 10*3/uL (ref 150–400)
RBC: 4.35 MIL/uL (ref 3.87–5.11)

## 2010-12-02 LAB — URINALYSIS, ROUTINE W REFLEX MICROSCOPIC
Ketones, ur: NEGATIVE mg/dL
Nitrite: NEGATIVE
Protein, ur: NEGATIVE mg/dL
pH: 7 (ref 5.0–8.0)

## 2010-12-02 LAB — COMPREHENSIVE METABOLIC PANEL
AST: 20 U/L (ref 0–37)
Albumin: 4.2 g/dL (ref 3.5–5.2)
Alkaline Phosphatase: 60 U/L (ref 39–117)
Chloride: 115 mEq/L — ABNORMAL HIGH (ref 96–112)
Creatinine, Ser: 0.66 mg/dL (ref 0.4–1.2)
GFR calc Af Amer: >60 mL/min (ref >60)
Potassium: 3.4 mEq/L — ABNORMAL LOW (ref 3.5–5.1)
Sodium: 145 mEq/L (ref 135–145)
Total Bilirubin: 0.5 mg/dL (ref 0.3–1.2)

## 2010-12-02 LAB — DIFFERENTIAL
Basophils Absolute: 0 10*3/uL (ref 0.0–0.1)
Eosinophils Relative: 2 % (ref 0–5)
Lymphocytes Relative: 50 % — ABNORMAL HIGH (ref 12–46)
Monocytes Absolute: 0.2 10*3/uL (ref 0.1–1.0)

## 2010-12-02 LAB — PREGNANCY, URINE: Preg Test, Ur: NEGATIVE

## 2010-12-02 LAB — RAPID URINE DRUG SCREEN, HOSP PERFORMED: Benzodiazepines: NOT DETECTED

## 2010-12-20 LAB — DIFFERENTIAL
Basophils Absolute: 0 10*3/uL (ref 0.0–0.1)
Basophils Relative: 0 % (ref 0–1)
Eosinophils Absolute: 0.1 10*3/uL (ref 0.0–0.7)
Eosinophils Relative: 2 % (ref 0–5)
Eosinophils Relative: 2 % (ref 0–5)
Lymphocytes Relative: 50 % — ABNORMAL HIGH (ref 12–46)
Lymphocytes Relative: 44 % (ref 12–46)
Lymphs Abs: 2.7 10*3/uL (ref 0.7–4.0)
Lymphs Abs: 1.8 10*3/uL (ref 0.7–4.0)
Monocytes Absolute: 0.3 10*3/uL (ref 0.1–1.0)
Monocytes Absolute: 0.3 10*3/uL (ref 0.1–1.0)
Monocytes Relative: 7 % (ref 3–12)
Neutro Abs: 2.1 10*3/uL (ref 1.7–7.7)
Neutro Abs: 2 10*3/uL (ref 1.7–7.7)
Neutrophils Relative %: 47 % (ref 43–77)

## 2010-12-20 LAB — COMPREHENSIVE METABOLIC PANEL
ALT: 19 U/L (ref 0–35)
AST: 22 U/L (ref 0–37)
AST: 25 U/L (ref 0–37)
Albumin: 3.7 g/dL (ref 3.5–5.2)
Albumin: 3.7 g/dL (ref 3.5–5.2)
Alkaline Phosphatase: 60 U/L (ref 39–117)
BUN: 8 mg/dL (ref 6–23)
CO2: 26 mEq/L (ref 19–32)
Calcium: 8.6 mg/dL (ref 8.4–10.5)
Calcium: 9.2 mg/dL (ref 8.4–10.5)
Chloride: 106 mEq/L (ref 96–112)
Creatinine, Ser: 0.69 mg/dL (ref 0.4–1.2)
Creatinine, Ser: 0.78 mg/dL (ref 0.4–1.2)
GFR calc Af Amer: >60 mL/min (ref >60)
GFR calc Af Amer: >60 mL/min (ref >60)
GFR calc non Af Amer: >60 mL/min (ref >60)
GFR calc non Af Amer: >60 mL/min (ref >60)
Glucose, Bld: 102 mg/dL — ABNORMAL HIGH (ref 70–99)
Potassium: 3.4 mEq/L — ABNORMAL LOW (ref 3.5–5.1)
Sodium: 140 mEq/L (ref 135–145)
Total Bilirubin: 0.4 mg/dL (ref 0.3–1.2)
Total Protein: 7 g/dL (ref 6.0–8.3)

## 2010-12-20 LAB — CBC
HCT: 41.1 % (ref 36.0–46.0)
Hemoglobin: 14 g/dL (ref 12.0–15.0)
MCHC: 33.4 g/dL (ref 30.0–36.0)
MCHC: 34 g/dL (ref 30.0–36.0)
MCV: 95.7 fL (ref 78.0–100.0)
MCV: 95.4 fL (ref 78.0–100.0)
Platelets: 267 10*3/uL (ref 150–400)
Platelets: 249 10*3/uL (ref 150–400)
RBC: 4.31 MIL/uL (ref 3.87–5.11)
RDW: 14.1 % (ref 11.5–15.5)
RDW: 13.7 % (ref 11.5–15.5)
WBC: 4.2 10*3/uL (ref 4.0–10.5)

## 2010-12-20 LAB — RAPID URINE DRUG SCREEN, HOSP PERFORMED
Amphetamines: NOT DETECTED
Barbiturates: NOT DETECTED
Benzodiazepines: NOT DETECTED
Cocaine: NOT DETECTED

## 2010-12-20 LAB — URINALYSIS, ROUTINE W REFLEX MICROSCOPIC
Bilirubin Urine: NEGATIVE
Bilirubin Urine: NEGATIVE
Glucose, UA: NEGATIVE mg/dL
Hgb urine dipstick: NEGATIVE
Ketones, ur: NEGATIVE mg/dL
Ketones, ur: NEGATIVE mg/dL
Nitrite: NEGATIVE
Nitrite: NEGATIVE
Protein, ur: NEGATIVE mg/dL
Protein, ur: NEGATIVE mg/dL
Specific Gravity, Urine: 1.017 (ref 1.005–1.030)
Specific Gravity, Urine: 1.022 (ref 1.005–1.030)
Urobilinogen, UA: 1 mg/dL (ref 0.0–1.0)
Urobilinogen, UA: 2 mg/dL — ABNORMAL HIGH (ref 0.0–1.0)
pH: 7.5 (ref 5.0–8.0)

## 2010-12-20 LAB — WET PREP, GENITAL

## 2010-12-20 LAB — GC/CHLAMYDIA PROBE AMP, GENITAL: Chlamydia, DNA Probe: NEGATIVE

## 2010-12-22 ENCOUNTER — Other Ambulatory Visit: Payer: Self-pay | Admitting: Family Medicine

## 2010-12-22 NOTE — Telephone Encounter (Signed)
Refill request

## 2010-12-23 LAB — APTT: aPTT: 26 seconds (ref 24–37)

## 2010-12-23 LAB — CBC
HCT: 35.8 % — ABNORMAL LOW (ref 36.0–46.0)
MCHC: 33 g/dL (ref 30.0–36.0)
MCV: 88.4 fL (ref 78.0–100.0)
Platelets: 280 10*3/uL (ref 150–400)
RDW: 18.5 % — ABNORMAL HIGH (ref 11.5–15.5)
WBC: 8.3 10*3/uL (ref 4.0–10.5)

## 2010-12-23 LAB — POCT I-STAT, CHEM 8
Creatinine, Ser: 0.7 mg/dL (ref 0.4–1.2)
HCT: 40 % (ref 36.0–46.0)
Hemoglobin: 13.6 g/dL (ref 12.0–15.0)
Potassium: 5.2 mEq/L — ABNORMAL HIGH (ref 3.5–5.1)
Sodium: 144 mEq/L (ref 135–145)
TCO2: 22 mmol/L (ref 0–100)

## 2010-12-23 LAB — DIFFERENTIAL
Basophils Absolute: 0.1 10*3/uL (ref 0.0–0.1)
Eosinophils Relative: 4 % (ref 0–5)
Lymphocytes Relative: 40 % (ref 12–46)
Lymphs Abs: 3.3 10*3/uL (ref 0.7–4.0)
Monocytes Absolute: 0.4 10*3/uL (ref 0.1–1.0)
Monocytes Relative: 5 % (ref 3–12)
Neutro Abs: 4.2 10*3/uL (ref 1.7–7.7)

## 2010-12-23 LAB — COMPREHENSIVE METABOLIC PANEL
AST: 16 U/L (ref 0–37)
Albumin: 4 g/dL (ref 3.5–5.2)
BUN: 7 mg/dL (ref 6–23)
Calcium: 8.5 mg/dL (ref 8.4–10.5)
Chloride: 115 mEq/L — ABNORMAL HIGH (ref 96–112)
Creatinine, Ser: 0.69 mg/dL (ref 0.4–1.2)
GFR calc Af Amer: >60 mL/min (ref >60)
Total Protein: 7.2 g/dL (ref 6.0–8.3)

## 2010-12-23 LAB — RAPID URINE DRUG SCREEN, HOSP PERFORMED
Amphetamines: NOT DETECTED
Benzodiazepines: NOT DETECTED
Opiates: NOT DETECTED
Tetrahydrocannabinol: NOT DETECTED

## 2010-12-23 LAB — URINE MICROSCOPIC-ADD ON

## 2010-12-23 LAB — URINALYSIS, ROUTINE W REFLEX MICROSCOPIC
Glucose, UA: NEGATIVE mg/dL
Nitrite: NEGATIVE
Protein, ur: NEGATIVE mg/dL
pH: 5.5 (ref 5.0–8.0)

## 2010-12-23 LAB — PROTIME-INR
INR: 1 (ref 0.00–1.49)
Prothrombin Time: 13.7 seconds (ref 11.6–15.2)

## 2010-12-23 LAB — HCG, QUANTITATIVE, PREGNANCY: hCG, Beta Chain, Quant, S: 16687 m[IU]/mL — ABNORMAL HIGH (ref <5)

## 2010-12-23 LAB — TSH: TSH: 0.528 u[IU]/mL (ref 0.350–4.500)

## 2011-01-26 NOTE — Op Note (Signed)
[name], Kristi Marshall             ACCOUNT NO.:  [id number]   MEDICAL RECORD NO.:  [id number]          PATIENT TYPE:  AMB   LOCATION:  SDC                           FACILITY:  [facility]   PHYSICIAN:  Tanya S. Shawnie Pons, M.D.   DATE OF BIRTH:  Dec 07, 1985   DATE OF PROCEDURE:  12/13/2008  DATE OF DISCHARGE:                               OPERATIVE REPORT   PREOPERATIVE DIAGNOSIS:  Probable incomplete abortion.   POSTOPERATIVE DIAGNOSIS:  Probable incomplete abortion.   PROCEDURES:  Suction dilation and curettage.   SURGEON:  Shelbie Proctor. Shawnie Pons, MD   ASSISTANT:  None.   ANESTHESIA:  Local and MAC.   FINDINGS:  A 13-week size uterus.   SPECIMENS:  Uterine contents to Pathology.   ESTIMATED BLOOD LOSS:  100 mL.   COMPLICATIONS:  None immediately known.   REASON FOR PROCEDURE:  Briefly, the patient is a 25 year old para 3 who  initially presented to [facility] ER with seizure disorder.  The patient  has a history of pseudoseizures apparently and had some heavy drinking  on the night prior to presentation was found down and vomiting, plus  reportedly had some seizure activity.  She was evaluated in the ER at  [facility] had a negative head CT with a positive EPT.  The patient was then  transferred to [facility] for further evaluation which revealed that she  had indeed a positive pregnancy test with a beta 16,000 and nothing in  the uterus.  She had 3-cm mass in the endometrial canal that was found  to be retained products.  Of note, the patient was here with nausea and  vomiting in December of this past year and had termination during that  or after that time.  The patient reports she has been bleeding since she  had a termination and it was felt she likely had retained products and  that is why she had persistently high beta-hCG.  Her abdomen was soft  and benign.  She never complained of belly pain, and she is certainly  having free fluid in her abdomen or almost or anything that looked like  an  ectopic out in the adnexa.   PROCEDURE:  The patient was taken to the OR where she was prepped and  draped in the usual sterile fashion.  A Foley catheter that had  previously been inserted in the bladder.  A speculum was placed inside  the vagina.  The cervix visualized, grasped anteriorly with single-tooth  tenaculum with a 20 mL 0.25% Marcaine were injected for paracervical  block.  After this was complete, the uterus was sounded to approximately  13 cm and was sequentially dilated.  After this, a 10-French curved  suction curette was run through several times without bringing a lot of  tissue out; however, sharp curettage resulted in removal of quite a bit  of tissue.  The suction was passed one more time with still no real  removal of tissue.  All instruments were removed from the vagina and  there was a small amount of  bleeding from the single-tooth tenaculum and a ring forceps was  used to  achieve hemostasis.  Once this was done a bimanual massage was done and  the uterus seemed firm.  All instrument and lap counts correct x2.  The  patient was awakened and taken to the recovery room in stable condition.      Shelbie Proctor. Shawnie Pons, M.D.  Electronically Signed     TSP/MEDQ  D:  12/13/2008  T:  12/14/2008  Job:  [id number]

## 2011-01-27 ENCOUNTER — Ambulatory Visit: Payer: Medicaid Other | Admitting: Family Medicine

## 2011-02-25 ENCOUNTER — Ambulatory Visit: Payer: Medicaid Other | Admitting: Family Medicine

## 2011-03-18 ENCOUNTER — Emergency Department (HOSPITAL_COMMUNITY)
Admission: EM | Admit: 2011-03-18 | Discharge: 2011-03-18 | Disposition: A | Discharge status: Home / Self Care | Payer: Self-pay | Attending: Emergency Medicine | Admitting: Emergency Medicine

## 2011-03-18 DIAGNOSIS — S90569A Insect bite (nonvenomous), unspecified ankle, initial encounter: Secondary | ICD-10-CM | POA: Insufficient documentation

## 2011-03-18 DIAGNOSIS — M545 Low back pain, unspecified: Secondary | ICD-10-CM | POA: Insufficient documentation

## 2011-03-18 DIAGNOSIS — W57XXXA Bitten or stung by nonvenomous insect and other nonvenomous arthropods, initial encounter: Secondary | ICD-10-CM | POA: Insufficient documentation

## 2011-03-18 DIAGNOSIS — M5430 Sciatica, unspecified side: Secondary | ICD-10-CM | POA: Insufficient documentation

## 2011-03-18 DIAGNOSIS — R569 Unspecified convulsions: Secondary | ICD-10-CM | POA: Insufficient documentation

## 2011-04-08 ENCOUNTER — Emergency Department (HOSPITAL_COMMUNITY)
Admission: EM | Admit: 2011-04-08 | Discharge: 2011-04-08 | Disposition: A | Discharge status: Home / Self Care | Payer: Medicaid Other | Attending: Emergency Medicine | Admitting: Emergency Medicine

## 2011-04-08 DIAGNOSIS — M545 Low back pain, unspecified: Secondary | ICD-10-CM | POA: Insufficient documentation

## 2011-04-08 DIAGNOSIS — X58XXXA Exposure to other specified factors, initial encounter: Secondary | ICD-10-CM | POA: Insufficient documentation

## 2011-04-08 DIAGNOSIS — R569 Unspecified convulsions: Secondary | ICD-10-CM | POA: Insufficient documentation

## 2011-04-08 DIAGNOSIS — S335XXA Sprain of ligaments of lumbar spine, initial encounter: Secondary | ICD-10-CM | POA: Insufficient documentation

## 2011-06-07 LAB — DIFFERENTIAL
Lymphocytes Relative: 24
Lymphs Abs: 2.4
Monocytes Relative: 5
Neutro Abs: 6.9
Neutrophils Relative %: 69

## 2011-06-07 LAB — COMPREHENSIVE METABOLIC PANEL
CO2: 24
Calcium: 8.9
Creatinine, Ser: 0.43
GFR calc non Af Amer: >60
Glucose, Bld: 82
Total Protein: 6.4

## 2011-06-07 LAB — RAPID HIV SCREEN (WH-MAU): Rapid HIV Screen: NONREACTIVE

## 2011-06-07 LAB — CBC
HCT: 23 — ABNORMAL LOW
Hemoglobin: 9.9 — ABNORMAL LOW
MCHC: 33.5
MCHC: 33.6
MCV: 87.6
MCV: 88.1
Platelets: 242
RBC: 3.36 — ABNORMAL LOW
RDW: 14.4
RDW: 14.5

## 2011-06-07 LAB — HEPATITIS B SURFACE ANTIGEN: Hepatitis B Surface Ag: NEGATIVE

## 2011-06-07 LAB — TYPE AND SCREEN: ABO/RH(D): AB POS

## 2011-06-07 LAB — RUBELLA SCREEN: Rubella: 9.1 — ABNORMAL HIGH

## 2011-06-07 LAB — LACTATE DEHYDROGENASE: LDH: 141

## 2011-06-07 LAB — ABO/RH: ABO/RH(D): AB POS

## 2011-06-07 LAB — SICKLE CELL SCREEN: Sickle Cell Screen: NEGATIVE

## 2011-06-07 LAB — RPR: RPR Ser Ql: NONREACTIVE

## 2011-06-18 LAB — URINALYSIS, ROUTINE W REFLEX MICROSCOPIC
Bilirubin Urine: NEGATIVE
Ketones, ur: NEGATIVE mg/dL
Nitrite: NEGATIVE
Protein, ur: NEGATIVE mg/dL
Specific Gravity, Urine: >1.03 — ABNORMAL HIGH (ref 1.005–1.030)
Urobilinogen, UA: 1 mg/dL (ref 0.0–1.0)

## 2011-06-18 LAB — GC/CHLAMYDIA PROBE AMP, GENITAL
Chlamydia, DNA Probe: NEGATIVE
GC Probe Amp, Genital: NEGATIVE

## 2011-06-25 LAB — GC/CHLAMYDIA PROBE AMP, GENITAL: Chlamydia, DNA Probe: NEGATIVE

## 2011-06-25 LAB — CBC
Hemoglobin: 13
RBC: 3.84 — ABNORMAL LOW
RDW: 12

## 2011-06-25 LAB — WET PREP, GENITAL: Trich, Wet Prep: NONE SEEN

## 2011-06-25 LAB — URINALYSIS, ROUTINE W REFLEX MICROSCOPIC
Nitrite: NEGATIVE
Protein, ur: NEGATIVE
Specific Gravity, Urine: 1.025
Urobilinogen, UA: 1

## 2011-06-25 LAB — URINE MICROSCOPIC-ADD ON

## 2011-07-29 ENCOUNTER — Encounter: Payer: Self-pay | Admitting: Emergency Medicine

## 2011-07-29 ENCOUNTER — Emergency Department (HOSPITAL_COMMUNITY)
Admission: EM | Admit: 2011-07-29 | Discharge: 2011-07-29 | Disposition: A | Discharge status: Home / Self Care | Payer: Medicaid Other | Source: Home / Self Care

## 2011-07-29 DIAGNOSIS — N760 Acute vaginitis: Secondary | ICD-10-CM

## 2011-07-29 LAB — POCT URINALYSIS DIP (DEVICE)
Nitrite: NEGATIVE
Urobilinogen, UA: >=8 mg/dL (ref 0.0–1.0)
pH: 6 (ref 5.0–8.0)

## 2011-07-29 LAB — WET PREP, GENITAL
Trich, Wet Prep: NONE SEEN
Yeast Wet Prep HPF POC: NONE SEEN

## 2011-07-29 LAB — POCT PREGNANCY, URINE: Preg Test, Ur: NEGATIVE

## 2011-07-29 MED ORDER — METRONIDAZOLE 500 MG PO TABS: 500.0000 mg | ORAL_TABLET | Freq: Two times a day (BID) | ORAL | Status: AC

## 2011-07-29 NOTE — ED Notes (Signed)
Patient denies any unusual discharge, no ongoing pain.  Patient has been told  By an ex-partner that his current partner has trichamonas.

## 2011-07-29 NOTE — ED Notes (Signed)
Patient tearful, has a headache, anxious.  Given kleenex and sprite.

## 2011-07-30 NOTE — ED Provider Notes (Signed)
History     CSN: [id number] Arrival date & time: 07/29/2011  5:53 PM   First MD Initiated Contact with Patient 07/29/11 1813      Chief Complaint  Patient presents with  . Exposure to STD    (Consider location/radiation/quality/duration/timing/severity/associated sxs/prior treatment) Patient is a 25 y.o. female presenting with STD exposure. The history is provided by the patient.  Exposure to STD This is a new (concerned for possible expossure to STD) problem.    History reviewed. No pertinent past medical history.  History reviewed. No pertinent past surgical history.  History reviewed. No pertinent family history.  History  Substance Use Topics  . Smoking status: Current Everyday Smoker  . Smokeless tobacco: Not on file  . Alcohol Use: No    OB History    Grav Para Term Preterm Abortions TAB SAB Ect Mult Living                  Review of Systems  Constitutional: Negative.   Genitourinary: Negative.     Allergies  Review of patient's allergies indicates no known allergies.  Home Medications   Current Outpatient Rx  Name Route Sig Dispense Refill  . CETIRIZINE HCL 10 MG PO TABS Oral Take 10 mg by mouth daily.     Marland Kitchen FLUTICASONE PROPIONATE 50 MCG/ACT NA SUSP Nasal Place 2 sprays into the nose at bedtime.     Marland Kitchen METRONIDAZOLE 500 MG PO TABS Oral Take 1 tablet (500 mg total) by mouth 2 times daily at 12 noon and 4 pm. 14 tablet no  . VENTOLIN HFA 108 (90 BASE) MCG/ACT IN AERS  inhale 1 to 2 puffs by mouth every 6 hours if needed for shortness of breath 18 g 0    BP 128/81  Pulse 112  Temp(Src) 98.4 F (36.9 C) (Oral)  Resp 20  SpO2 100%  LMP 07/18/2011  Physical Exam  Nursing note and vitals reviewed. Constitutional: She is oriented to person, place, and time. She appears well-developed and well-nourished. No distress.  HENT:  Mouth/Throat: No oropharyngeal exudate.  Eyes: No scleral icterus.  Cardiovascular: Normal rate and regular rhythm.     Pulmonary/Chest: Breath sounds normal.  Abdominal: Soft. There is no tenderness.  Genitourinary:       Mild erythema and thin bad oddor vaginal discharge. Cervix looks normal.  Lymphadenopathy:    She has no cervical adenopathy.  Neurological: She is alert and oriented to person, place, and time.  Skin: No rash noted.    ED Course  Procedures (including critical care time)  Labs Reviewed  WET PREP, GENITAL - Abnormal; Notable for the following:    Clue Cells, Wet Prep MANY (*)    All other components within normal limits  POCT URINALYSIS DIP (DEVICE) - Abnormal; Notable for the following:    Bilirubin Urine SMALL (*)    Ketones, ur TRACE (*)    Hgb urine dipstick MODERATE (*)    Leukocytes, UA TRACE (*) Biochemical Testing Only. Please order routine urinalysis from main lab if confirmatory testing is needed.   All other components within normal limits  GC/CHLAMYDIA PROBE AMP, GENITAL  POCT PREGNANCY, URINE   No results found.   1. Vaginitis       MDM  Treated with flagyl empirically.        Sharin Grave, MD 07/30/11 1340

## 2011-08-03 NOTE — ED Notes (Signed)
Labs and medications reviewed. Pt. adequately treated with Flagyl for bacterial vaginosis. Mimi Debellis M 08/03/2011   

## 2011-08-23 ENCOUNTER — Other Ambulatory Visit: Payer: Self-pay | Admitting: Family Medicine

## 2011-08-23 NOTE — Telephone Encounter (Signed)
Refill request

## 2011-09-14 NOTE — L&D Delivery Note (Signed)
Delivery Note At 3:02 AM a viable female was delivered via Vaginal, Spontaneous Delivery (Presentation: ;  ).  APGAR: , ; weight .   Placenta status: , .  Cord:  with the following complications: .  Cord pH: not done  Anesthesia: Epidural  Episiotomy:  Lacerations:  Suture Repair: 2.0 Est. Blood Loss (mL):   Mom to postpartum.  Baby to nursery-stable.  MARSHALL,BERNARD A 05/19/2012, 3:15 AM

## 2011-09-22 ENCOUNTER — Inpatient Hospital Stay (HOSPITAL_COMMUNITY)
Admission: AD | Admit: 2011-09-22 | Discharge: 2011-09-22 | Disposition: A | Discharge status: Home / Self Care | Payer: Medicaid Other | Source: Ambulatory Visit | Attending: Obstetrics & Gynecology | Admitting: Obstetrics & Gynecology

## 2011-09-22 ENCOUNTER — Inpatient Hospital Stay (HOSPITAL_COMMUNITY): Payer: Medicaid Other

## 2011-09-22 ENCOUNTER — Encounter (HOSPITAL_COMMUNITY): Payer: Self-pay | Admitting: *Deleted

## 2011-09-22 DIAGNOSIS — R109 Unspecified abdominal pain: Secondary | ICD-10-CM | POA: Insufficient documentation

## 2011-09-22 DIAGNOSIS — A499 Bacterial infection, unspecified: Secondary | ICD-10-CM

## 2011-09-22 DIAGNOSIS — B9689 Other specified bacterial agents as the cause of diseases classified elsewhere: Secondary | ICD-10-CM | POA: Insufficient documentation

## 2011-09-22 DIAGNOSIS — N760 Acute vaginitis: Secondary | ICD-10-CM | POA: Insufficient documentation

## 2011-09-22 DIAGNOSIS — O2390 Unspecified genitourinary tract infection in pregnancy, unspecified trimester: Secondary | ICD-10-CM | POA: Insufficient documentation

## 2011-09-22 DIAGNOSIS — O26899 Other specified pregnancy related conditions, unspecified trimester: Secondary | ICD-10-CM

## 2011-09-22 HISTORY — DX: Unspecified convulsions: R56.9

## 2011-09-22 HISTORY — DX: Anxiety disorder, unspecified: F41.9

## 2011-09-22 HISTORY — DX: Gestational (pregnancy-induced) hypertension without significant proteinuria, unspecified trimester: O13.9

## 2011-09-22 LAB — URINALYSIS, ROUTINE W REFLEX MICROSCOPIC
Bilirubin Urine: NEGATIVE
Glucose, UA: NEGATIVE mg/dL
Ketones, ur: 15 mg/dL — AB
Nitrite: NEGATIVE
Specific Gravity, Urine: 1.02 (ref 1.005–1.030)
pH: 6 (ref 5.0–8.0)

## 2011-09-22 LAB — CBC
HCT: 42.6 % (ref 36.0–46.0)
Hemoglobin: 14.7 g/dL (ref 12.0–15.0)
MCH: 33.4 pg (ref 26.0–34.0)
MCV: 96.8 fL (ref 78.0–100.0)
Platelets: 293 10*3/uL (ref 150–400)
RBC: 4.4 MIL/uL (ref 3.87–5.11)
WBC: 8.4 10*3/uL (ref 4.0–10.5)

## 2011-09-22 LAB — DIFFERENTIAL
Eosinophils Absolute: 0.1 10*3/uL (ref 0.0–0.7)
Eosinophils Relative: 1 % (ref 0–5)
Lymphocytes Relative: 39 % (ref 12–46)
Lymphs Abs: 3.3 10*3/uL (ref 0.7–4.0)
Monocytes Relative: 7 % (ref 3–12)

## 2011-09-22 LAB — HCG, QUANTITATIVE, PREGNANCY: hCG, Beta Chain, Quant, S: 14306 m[IU]/mL — ABNORMAL HIGH (ref <5)

## 2011-09-22 LAB — URINE MICROSCOPIC-ADD ON

## 2011-09-22 LAB — WET PREP, GENITAL: Trich, Wet Prep: NONE SEEN

## 2011-09-22 MED ORDER — METRONIDAZOLE 500 MG PO TABS: 500.0000 mg | ORAL_TABLET | Freq: Two times a day (BID) | ORAL | Status: DC

## 2011-09-22 NOTE — Progress Notes (Signed)
Patient states she had a positive home pregnancy test last night. Started cramping last night. Sates had a little spotting with discharge after intercourse 2 night ago. None now.

## 2011-09-22 NOTE — ED Provider Notes (Signed)
History     CSN: [id number]  Arrival date & time 09/22/11  1621   None     Chief Complaint  Patient presents with  . Abdominal Pain    HPI Kristi Marshall is a 26 y.o. female @ [redacted]w[redacted]d gestation who presents to [facility] for positive pregnancy test and lower abdominal cramping. Denies nausea or vomiting. LMP 08/17/11, Last pap smear less than one year ago and was [facility]. Current sex partner x 2 months. History of Chlamydia during pregnancy 2009. The history was provided by the patient.  Past Medical History  Diagnosis Date  . Seizure   . Anxiety   . PIH (pregnancy induced hypertension)     Past Surgical History  Procedure Date  . Abortions   . Dilation and curettage of uterus     No family history on file.  History  Substance Use Topics  . Smoking status: Current Everyday Smoker -- 0.2 packs/day  . Smokeless tobacco: Not on file  . Alcohol Use: No    OB History    Grav Para Term Preterm Abortions TAB SAB Ect Mult Living   7 3 3  3 3    3       Review of Systems  Constitutional: Negative for fever, chills, diaphoresis and fatigue.  HENT: Negative for ear pain, congestion, sore throat, facial swelling, neck pain, neck stiffness, dental problem and sinus pressure.   Eyes: Negative for photophobia, pain and discharge.  Respiratory: Negative for cough, chest tightness and wheezing.   Gastrointestinal: Negative for nausea, vomiting, abdominal pain (cramping), diarrhea, constipation and abdominal distention.  Genitourinary: Positive for frequency, vaginal bleeding and vaginal discharge. Negative for dysuria, urgency, flank pain and difficulty urinating.  Musculoskeletal: Negative for myalgias, back pain and gait problem.  Skin: Negative for color change and rash.  Neurological: Negative for dizziness, speech difficulty, weakness, light-headedness, numbness and headaches.  Psychiatric/Behavioral: Negative for confusion and agitation. The patient  is nervous/anxious.     Allergies  Review of patient's allergies indicates no known allergies.  Home Medications  No current outpatient prescriptions on file.  BP 101/75  Pulse 79  Temp(Src) 98.4 F (36.9 C) (Oral)  Resp 16  Ht 5\' 4"  (1.626 m)  Wt 163 lb 9.6 oz (74.208 kg)  BMI 28.08 kg/m2  SpO2 99%  LMP 08/17/2011  Physical Exam  Nursing note and vitals reviewed. Constitutional: She is oriented to person, place, and time. She appears well-developed and well-nourished. No distress.  HENT:  Head: Normocephalic.  Eyes: EOM are normal.  Neck: Neck supple.  Pulmonary/Chest: Effort normal.  Abdominal: Soft. There is no tenderness.  Genitourinary:       External genitalia without lesions. Scant blood vaginal vault, frothy discharge with odor. Cervix closed, long, no CMT, no adnexal tenderness or mass palpated. Uterus slightly enlarged.  Musculoskeletal: Normal range of motion.  Neurological: She is alert and oriented to person, place, and time. No cranial nerve deficit.  Skin: Skin is warm and dry.  Psychiatric: She has a normal mood and affect. Her behavior is normal. Judgment and thought content normal.    Results for orders placed during the hospital encounter of 09/22/11 (from the past 24 hour(s))  URINALYSIS, ROUTINE W REFLEX MICROSCOPIC     Status: Abnormal   Collection Time   09/22/11  4:50 PM      Component Value Range   Color, Urine YELLOW  YELLOW    APPearance CLEAR  CLEAR  Specific Gravity, Urine 1.020  1.005 - 1.030    pH 6.0  5.0 - 8.0    Glucose, UA NEGATIVE  NEGATIVE (mg/dL)   Hgb urine dipstick SMALL (*) NEGATIVE    Bilirubin Urine NEGATIVE  NEGATIVE    Ketones, ur 15 (*) NEGATIVE (mg/dL)   Protein, ur NEGATIVE  NEGATIVE (mg/dL)   Urobilinogen, UA 0.2  0.0 - 1.0 (mg/dL)   Nitrite NEGATIVE  NEGATIVE    Leukocytes, UA NEGATIVE  NEGATIVE   URINE MICROSCOPIC-ADD ON     Status: Abnormal   Collection Time   09/22/11  4:50 PM      Component Value Range    Squamous Epithelial / LPF FEW (*) RARE    RBC / HPF 0-2  <3 (RBC/hpf)   Urine-Other TALC CRYSTALS PRESENT    POCT PREGNANCY, URINE     Status: Normal   Collection Time   09/22/11  5:16 PM      Component Value Range   Preg Test, Ur POSITIVE    ABO/RH     Status: Normal   Collection Time   09/22/11  6:03 PM      Component Value Range   ABO/RH(D) AB POS    CBC     Status: Normal   Collection Time   09/22/11  6:04 PM      Component Value Range   WBC 8.4  4.0 - 10.5 (K/uL)   RBC 4.40  3.87 - 5.11 (MIL/uL)   Hemoglobin 14.7  12.0 - 15.0 (g/dL)   HCT 09.8  11.9 - 14.7 (%)   MCV 96.8  78.0 - 100.0 (fL)   MCH 33.4  26.0 - 34.0 (pg)   MCHC 34.5  30.0 - 36.0 (g/dL)   RDW 82.9  56.2 - 13.0 (%)   Platelets 293  150 - 400 (K/uL)  DIFFERENTIAL     Status: Normal   Collection Time   09/22/11  6:04 PM      Component Value Range   Neutrophils Relative 53  43 - 77 (%)   Neutro Abs 4.4  1.7 - 7.7 (K/uL)   Lymphocytes Relative 39  12 - 46 (%)   Lymphs Abs 3.3  0.7 - 4.0 (K/uL)   Monocytes Relative 7  3 - 12 (%)   Monocytes Absolute 0.6  0.1 - 1.0 (K/uL)   Eosinophils Relative 1  0 - 5 (%)   Eosinophils Absolute 0.1  0.0 - 0.7 (K/uL)   Basophils Relative 0  0 - 1 (%)   Basophils Absolute 0.0  0.0 - 0.1 (K/uL)  HCG, QUANTITATIVE, PREGNANCY     Status: Abnormal   Collection Time   09/22/11  6:04 PM      Component Value Range   hCG, Beta Chain, Quant, S 14306 (*) <5 (mIU/mL)  WET PREP, GENITAL     Status: Abnormal   Collection Time   09/22/11  6:27 PM      Component Value Range   Yeast, Wet Prep NONE SEEN  NONE SEEN    Trich, Wet Prep NONE SEEN  NONE SEEN    Clue Cells, Wet Prep MODERATE (*) NONE SEEN    WBC, Wet Prep HPF POC FEW (*) NONE SEEN    US Ob Comp Less 14 Wks  09/22/2011  *RADIOLOGY REPORT*  Clinical Data: Vaginal bleeding with a positive pregnancy test. Cramping.  OBSTETRIC <14 WK [place] AND TRANSVAGINAL OB US  Technique:  Both transabdominal and transvaginal ultrasound examinations were  performed for  complete evaluation of the gestation as well as the maternal uterus, adnexal regions, and pelvic cul-de-sac.  Transvaginal technique was performed to assess early pregnancy.  Comparison:  None.  Intrauterine gestational sac:  Single. Yolk sac: Visualized Embryo: Not visualized Cardiac Activity: N/A  MSD: 11.1  mm  5    w 6    d [place] EDC: 05/18/2012  Maternal uterus/adnexae: No evidence for subchorionic hemorrhage.  The maternal ovaries are sonographically normal with probable hemorrhagic corpus luteum cyst on the left.  No evidence for intraperitoneal free fluid.  IMPRESSION: Single intrauterine gestational sac with visualized yolk sac but no embryo yet apparent.  Mean sac diameter estimates a 5-week-6-day gestational age.  Original Report Authenticated By: ERIC A. MANSELL, M.D.   US Ob Transvaginal  09/22/2011  *RADIOLOGY REPORT*  Clinical Data: Vaginal bleeding with a positive pregnancy test. Cramping.  OBSTETRIC <14 WK [place] AND TRANSVAGINAL OB US  Technique:  Both transabdominal and transvaginal ultrasound examinations were performed for complete evaluation of the gestation as well as the maternal uterus, adnexal regions, and pelvic cul-de-sac.  Transvaginal technique was performed to assess early pregnancy.  Comparison:  None.  Intrauterine gestational sac:  Single. Yolk sac: Visualized Embryo: Not visualized Cardiac Activity: N/A  MSD: 11.1  mm  5    w 6    d [place] EDC: 05/18/2012  Maternal uterus/adnexae: No evidence for subchorionic hemorrhage.  The maternal ovaries are sonographically normal with probable hemorrhagic corpus luteum cyst on the left.  No evidence for intraperitoneal free fluid.  IMPRESSION: Single intrauterine gestational sac with visualized yolk sac but no embryo yet apparent.  Mean sac diameter estimates a 5-week-6-day gestational age.  Original Report Authenticated By: ERIC A. MANSELL, M.D.   Assessment: Bacterial vaginosis in pregnancy   IUP at 5 weeks and 6 days  Plan:  Rx  Flagyl   Start prenatal care   Return as needed. ED Course  Procedures  MDM          Kerrie Buffalo, NP 09/22/11 [phone]

## 2011-09-22 NOTE — Plan of Care (Signed)
Patient is not in the lobby when called to a room in [facility].  

## 2011-09-23 ENCOUNTER — Ambulatory Visit: Payer: Medicaid Other | Admitting: Family Medicine

## 2011-09-23 LAB — GC/CHLAMYDIA PROBE AMP, GENITAL: GC Probe Amp, Genital: NEGATIVE

## 2011-09-30 ENCOUNTER — Other Ambulatory Visit: Payer: Self-pay | Admitting: Family Medicine

## 2011-09-30 MED ORDER — CLINDAMYCIN PHOSPHATE 2 % VA CREA: 1.0000 | TOPICAL_CREAM | Freq: Every day | VAGINAL | Status: AC

## 2011-10-01 ENCOUNTER — Ambulatory Visit: Payer: Medicaid Other | Admitting: Family Medicine

## 2011-10-12 ENCOUNTER — Other Ambulatory Visit: Payer: Self-pay

## 2011-10-12 DIAGNOSIS — Z331 Pregnant state, incidental: Secondary | ICD-10-CM

## 2011-10-12 NOTE — Progress Notes (Signed)
Prenatal labs done today Kristi Marshall 

## 2011-10-13 LAB — OBSTETRIC PANEL
Antibody Screen: NEGATIVE
Basophils Absolute: 0 K/uL (ref 0.0–0.1)
Basophils Relative: 0 % (ref 0–1)
Eosinophils Absolute: 0.1 K/uL (ref 0.0–0.7)
Eosinophils Relative: 1 % (ref 0–5)
HCT: 38.6 % (ref 36.0–46.0)
Hemoglobin: 13 g/dL (ref 12.0–15.0)
Hepatitis B Surface Ag: NEGATIVE
Lymphocytes Relative: 29 % (ref 12–46)
Lymphs Abs: 2.8 K/uL (ref 0.7–4.0)
MCH: 32.3 pg (ref 26.0–34.0)
MCHC: 33.7 g/dL (ref 30.0–36.0)
MCV: 95.8 fL (ref 78.0–100.0)
Monocytes Absolute: 0.6 K/uL (ref 0.1–1.0)
Monocytes Relative: 6 % (ref 3–12)
Neutro Abs: 6.1 K/uL (ref 1.7–7.7)
Neutrophils Relative %: 64 % (ref 43–77)
Platelets: 308 K/uL (ref 150–400)
RBC: 4.03 MIL/uL (ref 3.87–5.11)
RDW: 12.3 % (ref 11.5–15.5)
Rh Type: POSITIVE
Rubella: 27.4 [IU]/mL — ABNORMAL HIGH
WBC: 9.6 K/uL (ref 4.0–10.5)

## 2011-10-13 LAB — SICKLE CELL SCREEN: Sickle Cell Screen: NEGATIVE

## 2011-10-13 LAB — HIV ANTIBODY (ROUTINE TESTING W REFLEX): HIV: NONREACTIVE

## 2011-10-14 LAB — CULTURE, OB URINE
Colony Count: NO GROWTH
Organism ID, Bacteria: NO GROWTH

## 2011-10-19 ENCOUNTER — Ambulatory Visit (INDEPENDENT_AMBULATORY_CARE_PROVIDER_SITE_OTHER): Payer: Self-pay | Admitting: Family Medicine

## 2011-10-19 ENCOUNTER — Encounter: Payer: Self-pay | Admitting: Family Medicine

## 2011-10-19 ENCOUNTER — Other Ambulatory Visit (HOSPITAL_COMMUNITY)
Admission: RE | Admit: 2011-10-19 | Discharge: 2011-10-19 | Disposition: A | Discharge status: Home / Self Care | Payer: Medicaid Other | Source: Ambulatory Visit | Attending: Family Medicine | Admitting: Family Medicine

## 2011-10-19 VITALS — BP 118/70 | Wt 165.5 lb

## 2011-10-19 DIAGNOSIS — Z3201 Encounter for pregnancy test, result positive: Secondary | ICD-10-CM

## 2011-10-19 DIAGNOSIS — Z124 Encounter for screening for malignant neoplasm of cervix: Secondary | ICD-10-CM

## 2011-10-19 DIAGNOSIS — Z01419 Encounter for gynecological examination (general) (routine) without abnormal findings: Secondary | ICD-10-CM | POA: Insufficient documentation

## 2011-10-19 DIAGNOSIS — Z3480 Encounter for supervision of other normal pregnancy, unspecified trimester: Secondary | ICD-10-CM

## 2011-10-19 NOTE — Patient Instructions (Signed)
It was good to see you.  Please start taking an over the counter prenatal vitamin.  Remember not to smoke or drink alcohol during pregnancy.

## 2011-10-19 NOTE — Progress Notes (Signed)
  Subjective:    Kristi Marshall is being seen today for her first obstetrical visit.  This is not a planned pregnancy. She is at [redacted]w[redacted]d gestation. Her obstetrical history is significant for group B strep colonizer, pregnancy induced hypertension and pre-eclampsia. Relationship with FOB: Significant other.  Pregnancy history fully reviewed.  Patient reports nausea.   Objective:     BP 118/70  Wt 165 lb 8 oz (75.07 kg)  LMP 08/17/2011  Breastfeeding? Unknown Physical Exam  Exam General appearance: alert, cooperative and no distress Pelvic: cervix normal in appearance, external genitalia normal, no adnexal masses or tenderness, no cervical motion tenderness, rectovaginal septum normal, uterus normal size, shape, and consistency and vagina normal without dischargelr  Assessment:    Pregnancy: [id number] Patient Active Problem List  Diagnoses  . THYROMEGALY  . TOBACCO USER  . ALLERGIC RHINITIS  . UNSPECIFIED VAGINITIS AND VULVOVAGINITIS  . SEIZURE DISORDER  . HEADACHE  . LACERATION, FOOT       Plan:     Initial labs reviewed, No abnormalities.  Reviewed weight gain goals, no smoking, alcohol, drugs.  Prenatal vitamins. Problem list reviewed and updated. AFP3 discussed: declined. Follow up in 4 weeks.   Lorik Guo 10/19/2011

## 2011-10-21 ENCOUNTER — Encounter: Payer: Self-pay | Admitting: Family Medicine

## 2011-11-16 ENCOUNTER — Ambulatory Visit (INDEPENDENT_AMBULATORY_CARE_PROVIDER_SITE_OTHER): Payer: Medicaid Other | Admitting: Family Medicine

## 2011-11-16 VITALS — BP 111/69 | Wt 168.0 lb

## 2011-11-16 DIAGNOSIS — Z3480 Encounter for supervision of other normal pregnancy, unspecified trimester: Secondary | ICD-10-CM

## 2011-11-16 MED ORDER — PRENATAL 27-0.8 MG PO TABS: 1.0000 | ORAL_TABLET | Freq: Every day | ORAL | Status: DC

## 2011-11-16 NOTE — Patient Instructions (Signed)
It was great to meet you. For your dizziness and spots, make sure you are drinking plenty of water and staying well hydrated. You can always go [facility] or here in clinic if you feel like your blood pressure is high. Start taking your prenatal vitamin, I sent in a prescription to the [facility] on [address].  Come back in about 4 weeks to either see OB Clinic, Dr. Lula Olszewski, or myself.   Come back before your next visit to have your sugar test done (you will need an hour).

## 2011-11-16 NOTE — Progress Notes (Signed)
S: 27 y.o. [id number] @ [redacted]w[redacted]d  (EDC 05/23/2012, by Last Menstrual Period )  -Doing well, no complaints -Good fetal movement: not yet -Concerns include: fatigue, misplaced PNV so not taking, also c/o some dizziness, no syncope or falls   O:  Filed Vitals:   11/16/11 1335  BP: 111/69   See OB flowsheet GEN: Well-developed female in no acute distress.   EXT: no edema.   A/P: 26 y.o. [id number] @ [redacted]w[redacted]d  (EDC 05/23/2012, by Last Menstrual Period )  - Dizziness, likely 2/2 dehydration, increase po fluids -f/u in 4 weeks with me or OB clinic -return for early 1hr GTT (overweight, h/o failing 1hr GTT, grandmother with DM, AA)

## 2011-11-16 NOTE — Patient Instructions (Addendum)
Please come back for your one hour sugar test. Please be seen in clinic or at [facility] if you start having worsening dizziness, spots in your vision, leg swelling, bad headache, or feel like your blood pressure is high. Try to drink more water to see if that helps with your dizziness.  Follow up in 4 weeks with Dr. Ladoris Gene, [facility] clinic, or me.

## 2011-11-16 NOTE — Assessment & Plan Note (Addendum)
Patient doing well, some c/o dizziness and spots in her vision, no LE edema, RUQ pain, or HTN.  Will encourage po hydration.  Patient does have h/o PreE with 1st pregnancy.  No preterm deliveries.  DOES NEED EARLY 1hr GTT (unable to put in as a future order and pt cannot stay for an hour today).  No FM yet, no vag d/c or bleeding.  Declines genetic screening.  PNL WNL, AB+, no need for rhogam.  PNV reordered since pt misplaced, encouraged compliance with this.  F/u 4 weeks.

## 2011-11-29 ENCOUNTER — Encounter (HOSPITAL_COMMUNITY): Payer: Self-pay | Admitting: *Deleted

## 2011-11-29 ENCOUNTER — Other Ambulatory Visit: Payer: Self-pay

## 2011-11-29 ENCOUNTER — Inpatient Hospital Stay (HOSPITAL_COMMUNITY)
Admission: AD | Admit: 2011-11-29 | Discharge: 2011-11-29 | Disposition: A | Discharge status: Home / Self Care | Payer: Medicaid Other | Source: Ambulatory Visit | Attending: Obstetrics & Gynecology | Admitting: Obstetrics & Gynecology

## 2011-11-29 DIAGNOSIS — O99891 Other specified diseases and conditions complicating pregnancy: Secondary | ICD-10-CM | POA: Insufficient documentation

## 2011-11-29 DIAGNOSIS — R002 Palpitations: Secondary | ICD-10-CM | POA: Insufficient documentation

## 2011-11-29 DIAGNOSIS — Z3480 Encounter for supervision of other normal pregnancy, unspecified trimester: Secondary | ICD-10-CM

## 2011-11-29 DIAGNOSIS — Z3201 Encounter for pregnancy test, result positive: Secondary | ICD-10-CM

## 2011-11-29 DIAGNOSIS — R079 Chest pain, unspecified: Secondary | ICD-10-CM | POA: Insufficient documentation

## 2011-11-29 DIAGNOSIS — R51 Headache: Secondary | ICD-10-CM | POA: Insufficient documentation

## 2011-11-29 LAB — CBC
MCH: 31.7 pg (ref 26.0–34.0)
Platelets: 269 10*3/uL (ref 150–400)
RBC: 3.75 MIL/uL — ABNORMAL LOW (ref 3.87–5.11)
WBC: 10 10*3/uL (ref 4.0–10.5)

## 2011-11-29 LAB — COMPREHENSIVE METABOLIC PANEL
ALT: 10 U/L (ref 0–35)
AST: 13 U/L (ref 0–37)
Alkaline Phosphatase: 57 U/L (ref 39–117)
CO2: 23 mEq/L (ref 19–32)
Calcium: 9.3 mg/dL (ref 8.4–10.5)
Chloride: 103 mEq/L (ref 96–112)
GFR calc non Af Amer: >90 mL/min (ref >90)
Potassium: 3.9 mEq/L (ref 3.5–5.1)
Sodium: 135 mEq/L (ref 135–145)

## 2011-11-29 LAB — URINALYSIS, ROUTINE W REFLEX MICROSCOPIC
Leukocytes, UA: NEGATIVE
Nitrite: NEGATIVE
Specific Gravity, Urine: 1.02 (ref 1.005–1.030)
Urobilinogen, UA: 4 mg/dL — ABNORMAL HIGH (ref 0.0–1.0)
pH: 6.5 (ref 5.0–8.0)

## 2011-11-29 MED ORDER — IBUPROFEN 800 MG PO TABS
800.0000 mg | ORAL_TABLET | Freq: Once | ORAL | Status: AC
  Administered 2011-11-29: 800 mg via ORAL
  Filled 2011-11-29: qty 1

## 2011-11-29 MED ORDER — IBUPROFEN 800 MG PO TABS: 800.0000 mg | ORAL_TABLET | Freq: Three times a day (TID) | ORAL | Status: AC | PRN

## 2011-11-29 NOTE — MAU Note (Signed)
C/o headaches and visual changes for past 2 weeks; pt is [redacted]w[redacted]d gest; hx of PIH with last pregnancy; G7P3;

## 2011-11-29 NOTE — MAU Note (Signed)
Patient states she has been having headaches on and off for about 2 weeks. Has dizziness and nausea with the headache  This am started having upper chest pain and heart racing. No chest pain at this time. Has her first appointment in 2 weeks with [facility]. Denies any bleeding or abdominal pain.

## 2011-11-29 NOTE — Plan of Care (Signed)
Patient is not in the lobby when called to triage.  

## 2011-11-29 NOTE — MAU Provider Note (Signed)
History     CSN: [id number]  Arrival date and time: 11/29/11 1325   None     Chief Complaint  Patient presents with  . Headache   HPI 26 y.o. [id number] at [redacted]w[redacted]d with headaches x 2 weeks, intermittent, about every other day. Better with Tylenol. Accompanied by nausea and dizziness. No vomiting. + scotomata with headaches.   Also c/o chest pain, left sided, + heart palpitations x a few minutes this morning, no pain or palpitations now.    Past Medical History  Diagnosis Date  . Seizure   . Anxiety   . PIH (pregnancy induced hypertension)     Past Surgical History  Procedure Date  . Abortions   . Dilation and curettage of uterus     Family History  Problem Relation Age of Onset  . Hypertension Mother   . Diabetes Maternal Aunt   . Hypertension Maternal Grandmother   . Diabetes Maternal Grandmother   . Heart disease Maternal Grandmother   . Hypertension Maternal Grandfather   . Heart disease Maternal Grandfather     History  Substance Use Topics  . Smoking status: Former Smoker -- 0.2 packs/day  . Smokeless tobacco: Not on file  . Alcohol Use: No    Allergies: No Known Allergies  Prescriptions prior to admission  Medication Sig Dispense Refill  . cetirizine (ZYRTEC) 10 MG tablet Take 10 mg by mouth daily.       . fluticasone (FLONASE) 50 MCG/ACT nasal spray Place 2 sprays into the nose at bedtime.       . Prenatal Vit-Fe Fumarate-FA (MULTIVITAMIN-PRENATAL) 27-0.8 MG TABS Take 1 tablet by mouth daily.  30 each  11  . VENTOLIN HFA 108 (90 BASE) MCG/ACT inhaler inhale 1 to 2 puffs by mouth every 6 hours if needed for shortness of breath  18 g  0    Review of Systems  Constitutional: Negative.   Eyes: Positive for photophobia.  Respiratory: Negative.   Cardiovascular: Positive for chest pain and palpitations.  Gastrointestinal: Negative for nausea, vomiting, abdominal pain, diarrhea and constipation.  Genitourinary: Negative for dysuria, urgency, frequency,  hematuria and flank pain.       Negative for vaginal bleeding, vaginal discharge  Musculoskeletal: Negative.   Neurological: Positive for headaches. Negative for dizziness, sensory change and loss of consciousness.  Psychiatric/Behavioral: Negative.    Physical Exam   Blood pressure 126/79, pulse 96, temperature 98.5 F (36.9 C), temperature source Oral, resp. rate 16, height 5\' 4"  (1.626 m), weight 168 lb 6.4 oz (76.386 kg), last menstrual period 08/17/2011, SpO2 99.00%, unknown if currently breastfeeding.  Physical Exam  Nursing note and vitals reviewed. Constitutional: She is oriented to person, place, and time. She appears well-developed and well-nourished. No distress.  Cardiovascular: Normal rate.   Respiratory: Effort normal.  GI: Soft. There is no tenderness.  Musculoskeletal: Normal range of motion.  Neurological: She is alert and oriented to person, place, and time.  Skin: Skin is warm and dry.  Psychiatric: She has a normal mood and affect.    MAU Course  Procedures  Results for orders placed during the hospital encounter of 11/29/11 (from the past 48 hour(s))  CBC     Status: Abnormal   Collection Time   11/29/11  2:15 PM      Component Value Range Comment   WBC 10.0  4.0 - 10.5 (K/uL)    RBC 3.75 (*) 3.87 - 5.11 (MIL/uL)    Hemoglobin 11.9 (*) 12.0 - 15.0 (g/dL)  HCT 35.0 (*) 36.0 - 46.0 (%)    MCV 93.3  78.0 - 100.0 (fL)    MCH 31.7  26.0 - 34.0 (pg)    MCHC 34.0  30.0 - 36.0 (g/dL)    RDW 16.1  09.6 - 04.5 (%)    Platelets 269  150 - 400 (K/uL)   COMPREHENSIVE METABOLIC PANEL     Status: Abnormal   Collection Time   11/29/11  2:15 PM      Component Value Range Comment   Sodium 135  135 - 145 (mEq/L)    Potassium 3.9  3.5 - 5.1 (mEq/L)    Chloride 103  96 - 112 (mEq/L)    CO2 23  19 - 32 (mEq/L)    Glucose, Bld 100 (*) 70 - 99 (mg/dL)    BUN 10  6 - 23 (mg/dL)    Creatinine, Ser 4.09  0.50 - 1.10 (mg/dL)    Calcium 9.3  8.4 - 10.5 (mg/dL)    Total  Protein 6.4  6.0 - 8.3 (g/dL)    Albumin 3.0 (*) 3.5 - 5.2 (g/dL)    AST 13  0 - 37 (U/L)    ALT 10  0 - 35 (U/L)    Alkaline Phosphatase 57  39 - 117 (U/L)    Total Bilirubin 0.1 (*) 0.3 - 1.2 (mg/dL)    GFR calc non Af Amer >90  >90 (mL/min)    GFR calc Af Amer >90  >90 (mL/min)   URINALYSIS, ROUTINE W REFLEX MICROSCOPIC     Status: Abnormal   Collection Time   11/29/11  2:50 PM      Component Value Range Comment   Color, Urine YELLOW  YELLOW     APPearance CLEAR  CLEAR     Specific Gravity, Urine 1.020  1.005 - 1.030     pH 6.5  5.0 - 8.0     Glucose, UA NEGATIVE  NEGATIVE (mg/dL)    Hgb urine dipstick NEGATIVE  NEGATIVE     Bilirubin Urine NEGATIVE  NEGATIVE     Ketones, ur NEGATIVE  NEGATIVE (mg/dL)    Protein, ur NEGATIVE  NEGATIVE (mg/dL)    Urobilinogen, UA 4.0 (*) 0.0 - 1.0 (mg/dL)    Nitrite NEGATIVE  NEGATIVE     Leukocytes, UA NEGATIVE  NEGATIVE  MICROSCOPIC NOT DONE ON URINES WITH NEGATIVE PROTEIN, BLOOD, LEUKOCYTES, NITRITE, OR GLUCOSE <1000 mg/dL.   EKG rev'd by Dr. Delford Field (E-link MD), no immediate follow up required.   Assessment and Plan  26 y.o. [id number] at [redacted]w[redacted]d Headache - resolves with Tylenol or Motrin, may take PRN Heart palpitations - follow up if symptoms continue   Kristi Marshall 11/29/2011, 3:27 PM

## 2011-12-20 ENCOUNTER — Other Ambulatory Visit: Payer: Self-pay | Admitting: Family Medicine

## 2011-12-20 NOTE — Telephone Encounter (Signed)
Will forward to Dr. Chamberlain 

## 2011-12-20 NOTE — Telephone Encounter (Signed)
Kristi Marshall also needs refill on her Flonase and inhaler and she wants to be sure that all of the meds are covered by Medicaid for herself, Raymonda Pell and Renaee Munda.

## 2011-12-20 NOTE — Telephone Encounter (Signed)
Patient is calling for a refill on Zyrtec to be sent to [facility] on [address].

## 2011-12-21 MED ORDER — ALBUTEROL SULFATE HFA 108 (90 BASE) MCG/ACT IN AERS: 2.0000 | INHALATION_SPRAY | RESPIRATORY_TRACT | Status: DC | PRN

## 2011-12-21 MED ORDER — CETIRIZINE HCL 10 MG PO TABS: 10.0000 mg | ORAL_TABLET | Freq: Every day | ORAL | Status: DC

## 2011-12-21 MED ORDER — FLUTICASONE PROPIONATE 50 MCG/ACT NA SUSP: 2.0000 | Freq: Every day | NASAL | Status: DC

## 2011-12-21 NOTE — Telephone Encounter (Signed)
Rx sent.  Tried to call Kristi Marshall to let her know I am not sure if Medicaid covers zyrtec because it is over the counter.  No answer.

## 2012-01-16 ENCOUNTER — Encounter (HOSPITAL_COMMUNITY): Payer: Self-pay | Admitting: *Deleted

## 2012-01-16 ENCOUNTER — Inpatient Hospital Stay (HOSPITAL_COMMUNITY)
Admission: AD | Admit: 2012-01-16 | Discharge: 2012-01-16 | Disposition: A | Discharge status: Home / Self Care | Payer: Medicaid Other | Source: Ambulatory Visit | Attending: Obstetrics & Gynecology | Admitting: Obstetrics & Gynecology

## 2012-01-16 DIAGNOSIS — O99891 Other specified diseases and conditions complicating pregnancy: Secondary | ICD-10-CM | POA: Insufficient documentation

## 2012-01-16 DIAGNOSIS — O2690 Pregnancy related conditions, unspecified, unspecified trimester: Secondary | ICD-10-CM

## 2012-01-16 DIAGNOSIS — O26899 Other specified pregnancy related conditions, unspecified trimester: Secondary | ICD-10-CM

## 2012-01-16 DIAGNOSIS — R109 Unspecified abdominal pain: Secondary | ICD-10-CM

## 2012-01-16 LAB — URINALYSIS, ROUTINE W REFLEX MICROSCOPIC
Bilirubin Urine: NEGATIVE
Hgb urine dipstick: NEGATIVE
Specific Gravity, Urine: 1.01 (ref 1.005–1.030)
Urobilinogen, UA: 0.2 mg/dL (ref 0.0–1.0)

## 2012-01-16 MED ORDER — IBUPROFEN 600 MG PO TABS
600.0000 mg | ORAL_TABLET | Freq: Once | ORAL | Status: AC
  Administered 2012-01-16: 600 mg via ORAL
  Filled 2012-01-16: qty 1

## 2012-01-16 NOTE — MAU Note (Signed)
Been cramping off and on all day. Tonight was sitting on floor and had increased pain in lower abd and pelvis when getting up an down. Cramping has been worse tonight

## 2012-01-16 NOTE — MAU Provider Note (Signed)
History     CSN: [id number]  Arrival date and time: 01/16/12 4098   First Provider Initiated Contact with Patient 01/16/12 0122      Chief Complaint  Patient presents with  . Abdominal Pain  . Vaginal Bleeding   HPI This is a 26 y.o. female at [redacted]w[redacted]d who presents with c/o cramping for 2 weeks. Thinks she saw blood when wiping tonight. No blood on pad before or after. Did not call Dr Gaynell Face.   OB History    Grav Para Term Preterm Abortions TAB SAB Ect Mult Living   6 3 3  2 2    3       Past Medical History  Diagnosis Date  . Seizure   . Anxiety   . PIH (pregnancy induced hypertension)     Past Surgical History  Procedure Date  . Abortions   . Dilation and curettage of uterus     Family History  Problem Relation Age of Onset  . Hypertension Mother   . Diabetes Maternal Aunt   . Hypertension Maternal Grandmother   . Diabetes Maternal Grandmother   . Heart disease Maternal Grandmother   . Hypertension Maternal Grandfather   . Heart disease Maternal Grandfather   . Anesthesia problems Neg Hx   . Hypotension Neg Hx   . Malignant hyperthermia Neg Hx   . Pseudochol deficiency Neg Hx     History  Substance Use Topics  . Smoking status: Former Smoker -- 0.2 packs/day  . Smokeless tobacco: Not on file  . Alcohol Use: No    Allergies: No Known Allergies  Prescriptions prior to admission  Medication Sig Dispense Refill  . albuterol (VENTOLIN HFA) 108 (90 BASE) MCG/ACT inhaler Inhale 2 puffs into the lungs every 4 (four) hours as needed for wheezing.  18 g  5  . cetirizine (ZYRTEC) 10 MG tablet Take 1 tablet (10 mg total) by mouth daily.  30 tablet  11  . fluticasone (FLONASE) 50 MCG/ACT nasal spray Place 2 sprays into the nose at bedtime.  16 g  6  . Prenatal Vit-Fe Fumarate-FA (PRENATAL MULTIVITAMIN) TABS Take 1 tablet by mouth daily.        Review of Systems  Constitutional: Negative for fever and chills.  Gastrointestinal: Positive for abdominal pain.  Negative for nausea, vomiting, diarrhea and constipation.    Physical Exam   Blood pressure 124/64, pulse 98, temperature 98.2 F (36.8 C), temperature source Oral, resp. rate 20, height 5\' 4"  (1.626 m), weight 180 lb 4 oz (81.761 kg), last menstrual period 08/17/2011.  Physical Exam  Constitutional: She is oriented to person, place, and time. She appears well-developed and well-nourished. No distress.  HENT:  Head: Normocephalic.  Cardiovascular: Normal rate.   Respiratory: Effort normal.  GI: Soft. She exhibits no distension. There is no tenderness. There is no rebound and no guarding.  Genitourinary: Vagina normal and uterus normal. No vaginal discharge (Only white discharge seen. No colored d/c at all. Cervix long and closed) found.  Musculoskeletal: Normal range of motion.  Neurological: She is alert and oriented to person, place, and time.  Skin: Skin is warm and dry.  Psychiatric: She has a normal mood and affect.    MAU Course  Procedures  MDM Given Motrin for cramping >>  Cramping is completely gone after med.   Assessment and Plan  A:  SIUP at [redacted]w[redacted]d       Uterine cramping vs Round ligament pain      No change  in cervix P:  Discharge home      Follow up with MD office  [facility] 01/16/2012, 1:56 AM

## 2012-01-16 NOTE — Discharge Instructions (Signed)

## 2012-01-16 NOTE — MAU Note (Signed)
Pt states, " I've had cramping for a couple of weeks, but it is worse tonight and I had some spotting."

## 2012-01-16 NOTE — Progress Notes (Signed)
Pt saw blood on tissue once earlier tonight. Thinks maybe wiped too hard

## 2012-01-16 NOTE — Progress Notes (Signed)
Wynelle Bourgeois CNM in to see pt. Spec exam -no specimens obtained

## 2012-01-16 NOTE — Progress Notes (Signed)
Written and verbal d/c instructions given and understanding voiced. Given 'Back Pain in Preg' Sheet

## 2012-02-01 ENCOUNTER — Inpatient Hospital Stay (HOSPITAL_COMMUNITY)
Admission: AD | Admit: 2012-02-01 | Discharge: 2012-02-01 | Disposition: A | Discharge status: Home / Self Care | Payer: Medicaid Other | Source: Ambulatory Visit | Attending: Family Medicine | Admitting: Family Medicine

## 2012-02-01 ENCOUNTER — Encounter (HOSPITAL_COMMUNITY): Payer: Self-pay

## 2012-02-01 DIAGNOSIS — Z3480 Encounter for supervision of other normal pregnancy, unspecified trimester: Secondary | ICD-10-CM

## 2012-02-01 DIAGNOSIS — R55 Syncope and collapse: Secondary | ICD-10-CM

## 2012-02-01 DIAGNOSIS — Z3201 Encounter for pregnancy test, result positive: Secondary | ICD-10-CM

## 2012-02-01 DIAGNOSIS — O2650 Maternal hypotension syndrome, unspecified trimester: Secondary | ICD-10-CM | POA: Insufficient documentation

## 2012-02-01 LAB — COMPREHENSIVE METABOLIC PANEL
AST: 16 U/L (ref 0–37)
Albumin: 2.8 g/dL — ABNORMAL LOW (ref 3.5–5.2)
Calcium: 9.2 mg/dL (ref 8.4–10.5)
Chloride: 101 mEq/L (ref 96–112)
Creatinine, Ser: 0.44 mg/dL — ABNORMAL LOW (ref 0.50–1.10)

## 2012-02-01 LAB — CBC
MCV: 93.6 fL (ref 78.0–100.0)
Platelets: 262 10*3/uL (ref 150–400)
RDW: 12.8 % (ref 11.5–15.5)
WBC: 12.1 10*3/uL — ABNORMAL HIGH (ref 4.0–10.5)

## 2012-02-01 LAB — RAPID URINE DRUG SCREEN, HOSP PERFORMED
Barbiturates: NOT DETECTED
Benzodiazepines: NOT DETECTED
Cocaine: NOT DETECTED
Tetrahydrocannabinol: NOT DETECTED

## 2012-02-01 LAB — URINALYSIS, ROUTINE W REFLEX MICROSCOPIC
Glucose, UA: NEGATIVE mg/dL
Hgb urine dipstick: NEGATIVE
Protein, ur: NEGATIVE mg/dL
Specific Gravity, Urine: 1.01 (ref 1.005–1.030)

## 2012-02-01 MED ORDER — LACTATED RINGERS IV SOLN
Freq: Once | INTRAVENOUS | Status: AC
  Administered 2012-02-01: 20:00:00 via INTRAVENOUS

## 2012-02-01 NOTE — MAU Provider Note (Signed)
History     CSN: [id number]  Arrival date and time: 02/01/12 1936   None     No chief complaint on file.  HPI This is a 26 y.o. female at [redacted]w[redacted]d who presents via EMS with report of having a seizure at home. EMS found her to be awake and alert, not post-ictal. Per her report, she was lying on the couch by her boyfriend and got up because she felt nauseated.  AFter getting up, she felt dizzy and he says her eyes rolled back and she passed out. No report of jerking movements. She woke up to "alot of people around me".  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  States she has them when she is stressed out. There are several ER visits for heavy alcohol drinking and passing out. She states she only drinks like that when nonpregnant. States she has always passed out "when under a lot of stress".   OB History    Grav Para Term Preterm Abortions TAB SAB Ect Mult Living   6 3 3  2 2    3       Past Medical History  Diagnosis Date  . Anxiety   . PIH (pregnancy induced hypertension)   . Seizure     pseudoseizures    Past Surgical History  Procedure Date  . Abortions   . Dilation and curettage of uterus     Family History  Problem Relation Age of Onset  . Hypertension Mother   . Diabetes Maternal Aunt   . Hypertension Maternal Grandmother   . Diabetes Maternal Grandmother   . Heart disease Maternal Grandmother   . Hypertension Maternal Grandfather   . Heart disease Maternal Grandfather   . Anesthesia problems Neg Hx   . Hypotension Neg Hx   . Malignant hyperthermia Neg Hx   . Pseudochol deficiency Neg Hx     History  Substance Use Topics  . Smoking status: Current Some Day Smoker -- 0.2 packs/day    Types: Cigarettes  . Smokeless tobacco: Not on file  . Alcohol Use: No    Allergies: No Known Allergies  Prescriptions prior to admission  Medication Sig Dispense Refill  . cetirizine (ZYRTEC) 10 MG tablet Take 1 tablet (10 mg total) by mouth daily.  30 tablet  11  .  fluticasone (FLONASE) 50 MCG/ACT nasal spray Place 2 sprays into the nose at bedtime.  16 g  6  . Prenatal Vit-Fe Fumarate-FA (PRENATAL MULTIVITAMIN) TABS Take 1 tablet by mouth daily.      Marland Kitchen albuterol (VENTOLIN HFA) 108 (90 BASE) MCG/ACT inhaler Inhale 2 puffs into the lungs every 4 (four) hours as needed for wheezing.  18 g  5    Review of Systems  Constitutional: Positive for malaise/fatigue. Negative for fever.  Eyes: Negative for blurred vision.  Cardiovascular: Negative for chest pain.  Gastrointestinal: Negative for nausea, vomiting, abdominal pain, diarrhea and constipation.  Neurological: Positive for dizziness (at home, not now), seizures (pseudoseizures) and weakness. Negative for headaches.   As listed in HPI.  Physical Exam   Blood pressure 116/72, pulse 111, temperature 98.5 F (36.9 C), temperature source Oral, resp. rate 18, last menstrual period 08/17/2011, SpO2 98.00%. Filed Vitals:   02/01/12 1926  BP: 116/72  Pulse: 111  Temp: 98.5 F (36.9 C)  TempSrc: Oral  Resp: 18  SpO2: 98%    Physical Exam  Constitutional: She is oriented to person, place, and time. She appears well-developed and well-nourished.  HENT:  Head: Normocephalic.  Cardiovascular: Normal rate.   Respiratory: Effort normal.  GI: Soft. She exhibits no distension. There is no tenderness. There is no rebound and no guarding.  Musculoskeletal: Normal range of motion.  Neurological: She is alert and oriented to person, place, and time. She displays normal reflexes. No cranial nerve deficit. She exhibits normal muscle tone. Coordination normal.       Coherent speech, remembers event  Skin: Skin is warm and dry.  Psychiatric: She has a normal mood and affect.   Results for orders placed during the hospital encounter of 02/01/12 (from the past 24 hour(s))  URINALYSIS, ROUTINE W REFLEX MICROSCOPIC     Status: Normal   Collection Time   02/01/12  7:46 PM      Component Value Range   Color, Urine  YELLOW  YELLOW    APPearance CLEAR  CLEAR    Specific Gravity, Urine 1.010  1.005 - 1.030    pH 6.5  5.0 - 8.0    Glucose, UA NEGATIVE  NEGATIVE (mg/dL)   Hgb urine dipstick NEGATIVE  NEGATIVE    Bilirubin Urine NEGATIVE  NEGATIVE    Ketones, ur NEGATIVE  NEGATIVE (mg/dL)   Protein, ur NEGATIVE  NEGATIVE (mg/dL)   Urobilinogen, UA 0.2  0.0 - 1.0 (mg/dL)   Nitrite NEGATIVE  NEGATIVE    Leukocytes, UA NEGATIVE  NEGATIVE   CBC     Status: Abnormal   Collection Time   02/01/12  7:59 PM      Component Value Range   WBC 12.1 (*) 4.0 - 10.5 (K/uL)   RBC 3.30 (*) 3.87 - 5.11 (MIL/uL)   Hemoglobin 10.5 (*) 12.0 - 15.0 (g/dL)   HCT 16.1 (*) 09.6 - 46.0 (%)   MCV 93.6  78.0 - 100.0 (fL)   MCH 31.8  26.0 - 34.0 (pg)   MCHC 34.0  30.0 - 36.0 (g/dL)   RDW 04.5  40.9 - 81.1 (%)   Platelets 262  150 - 400 (K/uL)  COMPREHENSIVE METABOLIC PANEL     Status: Abnormal   Collection Time   02/01/12  7:59 PM      Component Value Range   Sodium 134 (*) 135 - 145 (mEq/L)   Potassium 3.7  3.5 - 5.1 (mEq/L)   Chloride 101  96 - 112 (mEq/L)   CO2 24  19 - 32 (mEq/L)   Glucose, Bld 83  70 - 99 (mg/dL)   BUN 6  6 - 23 (mg/dL)   Creatinine, Ser 9.14 (*) 0.50 - 1.10 (mg/dL)   Calcium 9.2  8.4 - 78.2 (mg/dL)   Total Protein 6.3  6.0 - 8.3 (g/dL)   Albumin 2.8 (*) 3.5 - 5.2 (g/dL)   AST 16  0 - 37 (U/L)   ALT 10  0 - 35 (U/L)   Alkaline Phosphatase 80  39 - 117 (U/L)   Total Bilirubin 0.2 (*) 0.3 - 1.2 (mg/dL)   GFR calc non Af Amer >90  >90 (mL/min)   GFR calc Af Amer >90  >90 (mL/min)     MAU Course  Procedures  MDM IV hydration given. No evidence of Preeclampsia. No neuro deficits. Suspect syncope.  Assessment and Plan  A:  SIUP at [redacted]w[redacted]d      Syncope, probably related to poor intake of food and fluids (did not eat all day)      No Neuro deficits P:  Discharge      Discussed with Dr Shawnie Pons  Follow up with [facility] this week  [facility] 02/01/2012, 9:43 PM

## 2012-02-01 NOTE — MAU Note (Signed)
Patient states was laying down and felt like she was going to pass out. Then she blacked out and woke up with people standing over her. Denies vaginal bleeding, vaginal discharge, or abdominal pain.

## 2012-02-01 NOTE — Discharge Instructions (Signed)
Syncope You have had a fainting (syncopal) spell. A fainting episode is a sudden, short-lived loss of consciousness. It results in complete recovery. It occurs because there has been a temporary shortage of oxygen and/or sugar (glucose) to the brain. CAUSES   Blood pressure pills and other medications that may lower blood pressure below normal. Sudden changes in posture (sudden standing).   Over-medication. Take your medications as directed.   Standing too long. This can cause blood to pool in the legs.   Seizure disorders.   Low blood sugar (hypoglycemia) of diabetes. This more commonly causes coma.   Bearing down to go to the bathroom. This can cause your blood pressure to rise suddenly. Your body compensates by making the blood pressure too low when you stop bearing down.   Hardening of the arteries where the brain temporarily does not receive enough blood.   Irregular heart beat and circulatory problems.   Fear, emotional distress, injury, sight of blood, or illness.  Your caregiver will send you home if the syncope was from non-worrisome causes (benign). Depending on your age and health, you may stay to be monitored and observed. If you return home, have someone stay with you if your caregiver feels that is desirable. It is very important to keep all follow-up referrals and appointments in order to properly manage this condition. This is a serious problem which can lead to serious illness and death if not carefully managed.  WARNING: Do not drive or operate machinery until your caregiver feels that it is safe for you to do so. SEEK IMMEDIATE MEDICAL CARE IF:   You have another fainting episode or faint while lying or sitting down. DO NOT DRIVE YOURSELF. Call 911 if no other help is available.   You have chest pain, are feeling sick to your stomach (nausea), vomiting or abdominal pain.   You have an irregular heartbeat or one that is very fast (pulse over 120 beats per minute).    You have a loss of feeling in some part of your body or lose movement in your arms or legs.   You have difficulty with speech, confusion, severe weakness, or visual problems.   You become sweaty and/or feel light headed.  Make sure you are rechecked as instructed. Document Released: 08/30/2005 Document Revised: 08/19/2011 Document Reviewed: 04/20/2007 [employer] Patient Information 2012 [employer]. 

## 2012-05-09 ENCOUNTER — Inpatient Hospital Stay (HOSPITAL_COMMUNITY)
Admission: AD | Admit: 2012-05-09 | Discharge: 2012-05-09 | Disposition: A | Discharge status: Home / Self Care | Payer: Medicaid Other | Source: Ambulatory Visit | Attending: Obstetrics | Admitting: Obstetrics

## 2012-05-09 ENCOUNTER — Encounter (HOSPITAL_COMMUNITY): Payer: Self-pay | Admitting: *Deleted

## 2012-05-09 DIAGNOSIS — O99891 Other specified diseases and conditions complicating pregnancy: Secondary | ICD-10-CM | POA: Insufficient documentation

## 2012-05-09 DIAGNOSIS — Z3480 Encounter for supervision of other normal pregnancy, unspecified trimester: Secondary | ICD-10-CM

## 2012-05-09 DIAGNOSIS — R109 Unspecified abdominal pain: Secondary | ICD-10-CM | POA: Insufficient documentation

## 2012-05-09 DIAGNOSIS — Z3201 Encounter for pregnancy test, result positive: Secondary | ICD-10-CM

## 2012-05-09 HISTORY — DX: Other seasonal allergic rhinitis: J30.2

## 2012-05-09 HISTORY — DX: Bronchitis, not specified as acute or chronic: J40

## 2012-05-09 NOTE — MAU Note (Signed)
Patient state she has been having lower abdominal pain on and off since this am, not sure if contractions. Reports good fetal movement, no bleeding or leaking.

## 2012-05-11 ENCOUNTER — Inpatient Hospital Stay (HOSPITAL_COMMUNITY): Payer: Medicaid Other

## 2012-05-11 ENCOUNTER — Inpatient Hospital Stay (HOSPITAL_COMMUNITY)
Admission: AD | Admit: 2012-05-11 | Discharge: 2012-05-12 | DRG: 780 | Disposition: A | Discharge status: Home / Self Care | Payer: Medicaid Other | Source: Ambulatory Visit | Attending: Obstetrics | Admitting: Obstetrics

## 2012-05-11 ENCOUNTER — Encounter (HOSPITAL_COMMUNITY): Payer: Self-pay | Admitting: *Deleted

## 2012-05-11 DIAGNOSIS — O479 False labor, unspecified: Principal | ICD-10-CM | POA: Diagnosis present

## 2012-05-11 DIAGNOSIS — N10 Acute tubulo-interstitial nephritis: Secondary | ICD-10-CM

## 2012-05-11 HISTORY — DX: Gestational (pregnancy-induced) hypertension without significant proteinuria, unspecified trimester: O13.9

## 2012-05-11 LAB — OB RESULTS CONSOLE GBS: GBS: POSITIVE

## 2012-05-11 LAB — CBC
MCH: 29.2 pg (ref 26.0–34.0)
MCHC: 32.7 g/dL (ref 30.0–36.0)
Platelets: 281 10*3/uL (ref 150–400)

## 2012-05-11 MED ORDER — ONDANSETRON HCL 4 MG/2ML IJ SOLN: 4.0000 mg | Freq: Four times a day (QID) | INTRAMUSCULAR | Status: DC | PRN

## 2012-05-11 MED ORDER — OXYTOCIN 40 UNITS IN LACTATED RINGERS INFUSION - SIMPLE MED: 62.5000 mL/h | Freq: Once | INTRAVENOUS | Status: DC

## 2012-05-11 MED ORDER — LACTATED RINGERS IV SOLN: 500.0000 mL | Freq: Once | INTRAVENOUS | Status: DC

## 2012-05-11 MED ORDER — PHENYLEPHRINE 40 MCG/ML (10ML) SYRINGE FOR IV PUSH (FOR BLOOD PRESSURE SUPPORT): 80.0000 ug | PREFILLED_SYRINGE | INTRAVENOUS | Status: DC | PRN

## 2012-05-11 MED ORDER — IBUPROFEN 600 MG PO TABS: 600.0000 mg | ORAL_TABLET | Freq: Four times a day (QID) | ORAL | Status: DC | PRN

## 2012-05-11 MED ORDER — DIPHENHYDRAMINE HCL 50 MG/ML IJ SOLN: 12.5000 mg | INTRAMUSCULAR | Status: DC | PRN

## 2012-05-11 MED ORDER — EPHEDRINE 5 MG/ML INJ: 10.0000 mg | INTRAVENOUS | Status: DC | PRN

## 2012-05-11 MED ORDER — LACTATED RINGERS IV SOLN: 500.0000 mL | INTRAVENOUS | Status: DC | PRN

## 2012-05-11 MED ORDER — DEXTROSE 5 % IV SOLN
2.5000 10*6.[IU] | INTRAVENOUS | Status: DC
  Administered 2012-05-12 (×2): 2.5 10*6.[IU] via INTRAVENOUS
  Filled 2012-05-11 (×5): qty 2.5

## 2012-05-11 MED ORDER — OXYCODONE-ACETAMINOPHEN 5-325 MG PO TABS: 1.0000 | ORAL_TABLET | ORAL | Status: DC | PRN

## 2012-05-11 MED ORDER — BUTORPHANOL TARTRATE 1 MG/ML IJ SOLN
1.0000 mg | INTRAMUSCULAR | Status: DC | PRN
  Administered 2012-05-12: 1 mg via INTRAVENOUS
  Filled 2012-05-11: qty 1

## 2012-05-11 MED ORDER — CITRIC ACID-SODIUM CITRATE 334-500 MG/5ML PO SOLN: 30.0000 mL | ORAL | Status: DC | PRN

## 2012-05-11 MED ORDER — FLEET ENEMA 7-19 GM/118ML RE ENEM: 1.0000 | ENEMA | RECTAL | Status: DC | PRN

## 2012-05-11 MED ORDER — PENICILLIN G POTASSIUM 5000000 UNITS IJ SOLR
5.0000 10*6.[IU] | Freq: Once | INTRAVENOUS | Status: AC
  Administered 2012-05-11: 5 10*6.[IU] via INTRAVENOUS
  Filled 2012-05-11: qty 5

## 2012-05-11 MED ORDER — ACETAMINOPHEN 325 MG PO TABS
650.0000 mg | ORAL_TABLET | ORAL | Status: DC | PRN
  Administered 2012-05-11: 650 mg via ORAL
  Filled 2012-05-11: qty 2

## 2012-05-11 MED ORDER — FENTANYL 2.5 MCG/ML BUPIVACAINE 1/10 % EPIDURAL INFUSION (WH - ANES): 14.0000 mL/h | INTRAMUSCULAR | Status: DC

## 2012-05-11 MED ORDER — OXYTOCIN BOLUS FROM INFUSION
250.0000 mL | Freq: Once | INTRAVENOUS | Status: DC
  Filled 2012-05-11: qty 500

## 2012-05-11 MED ORDER — LACTATED RINGERS IV SOLN
INTRAVENOUS | Status: DC
  Administered 2012-05-12: 02:00:00 via INTRAVENOUS

## 2012-05-11 MED ORDER — LIDOCAINE HCL (PF) 1 % IJ SOLN: 30.0000 mL | INTRAMUSCULAR | Status: DC | PRN

## 2012-05-11 NOTE — MAU Note (Signed)
Been feeling pains  (balling up) and pressure in lower abd.  Getting stronger. Lost mucous plug early this afternoon.

## 2012-05-11 NOTE — MAU Note (Signed)
Missed appt yesterday- no transportation

## 2012-05-11 NOTE — MAU Note (Signed)
[place] paged- notified of [place] order

## 2012-05-11 NOTE — MAU Note (Signed)
Hot pack to back

## 2012-05-12 ENCOUNTER — Encounter (HOSPITAL_COMMUNITY): Payer: Self-pay | Admitting: *Deleted

## 2012-05-12 LAB — RPR: RPR Ser Ql: NONREACTIVE

## 2012-05-12 NOTE — Progress Notes (Signed)
Asked to see pt to help with d/c.  Pt stable w/o ctes.  Moderate variable seen earlier but NST R and good fm.  SVE 4/60/-2.  OK for d/c.

## 2012-05-12 NOTE — Progress Notes (Signed)
efm dc'd

## 2012-05-16 ENCOUNTER — Encounter (HOSPITAL_COMMUNITY): Payer: Self-pay

## 2012-05-16 ENCOUNTER — Inpatient Hospital Stay (HOSPITAL_COMMUNITY)
Admission: AD | Admit: 2012-05-16 | Discharge: 2012-05-16 | Disposition: A | Discharge status: Home / Self Care | Payer: Medicaid Other | Source: Ambulatory Visit | Attending: Obstetrics | Admitting: Obstetrics

## 2012-05-16 DIAGNOSIS — Z3480 Encounter for supervision of other normal pregnancy, unspecified trimester: Secondary | ICD-10-CM

## 2012-05-16 DIAGNOSIS — Z3201 Encounter for pregnancy test, result positive: Secondary | ICD-10-CM

## 2012-05-16 DIAGNOSIS — O479 False labor, unspecified: Secondary | ICD-10-CM | POA: Insufficient documentation

## 2012-05-16 MED ORDER — OXYCODONE-ACETAMINOPHEN 5-325 MG PO TABS
2.0000 | ORAL_TABLET | Freq: Once | ORAL | Status: AC
  Administered 2012-05-16: 2 via ORAL
  Filled 2012-05-16: qty 2

## 2012-05-16 NOTE — Progress Notes (Signed)
Dr Clearance Coots states that he will order vicodin and Remus Loffler for patient and will call it to [facility] on [address] per patient request. Order to give 2 tablets of percocet.

## 2012-05-16 NOTE — MAU Note (Signed)
Pt states was admitted to labor and delivery, has been 5 cm, was then sent home. Feels like ctx's are more intense, still only having 2 in 30 minutes. Notes a lot of mucous like discharge.

## 2012-05-16 NOTE — Progress Notes (Signed)
Dr Clearance Coots notified of patient, tracing, ctx pattern, sve result. Patient is asking to speak to dr Clearance Coots because she does not want to go home with painful contractions.

## 2012-05-16 NOTE — Progress Notes (Signed)
Dr Clearance Coots notified of patient, her history of being sent home from l/d due to unchanged cervix dilation (5cm), tracing, sve result today. Order to monitor for an hour, call with updates.

## 2012-05-16 NOTE — MAU Note (Signed)
Patient is in with painful contractions. She denies lof or vaginal bleeding. Reports good fetal movement.

## 2012-05-18 ENCOUNTER — Inpatient Hospital Stay (HOSPITAL_COMMUNITY): Payer: Medicaid Other | Admitting: Anesthesiology

## 2012-05-18 ENCOUNTER — Inpatient Hospital Stay (HOSPITAL_COMMUNITY)
Admission: AD | Admit: 2012-05-18 | Discharge: 2012-05-21 | DRG: 775 | Disposition: A | Discharge status: Home / Self Care | Payer: Medicaid Other | Source: Ambulatory Visit | Attending: Obstetrics | Admitting: Obstetrics

## 2012-05-18 ENCOUNTER — Encounter (HOSPITAL_COMMUNITY): Payer: Self-pay | Admitting: *Deleted

## 2012-05-18 ENCOUNTER — Encounter (HOSPITAL_COMMUNITY): Payer: Self-pay | Admitting: Anesthesiology

## 2012-05-18 DIAGNOSIS — Z22330 Carrier of Group B streptococcus: Secondary | ICD-10-CM

## 2012-05-18 DIAGNOSIS — O99019 Anemia complicating pregnancy, unspecified trimester: Secondary | ICD-10-CM | POA: Diagnosis present

## 2012-05-18 DIAGNOSIS — Z3201 Encounter for pregnancy test, result positive: Secondary | ICD-10-CM

## 2012-05-18 DIAGNOSIS — O99892 Other specified diseases and conditions complicating childbirth: Principal | ICD-10-CM | POA: Diagnosis present

## 2012-05-18 DIAGNOSIS — Z3480 Encounter for supervision of other normal pregnancy, unspecified trimester: Secondary | ICD-10-CM

## 2012-05-18 LAB — CBC
MCH: 28.9 pg (ref 26.0–34.0)
MCV: 88.6 fL (ref 78.0–100.0)
Platelets: 286 10*3/uL (ref 150–400)
RBC: 3.67 MIL/uL — ABNORMAL LOW (ref 3.87–5.11)
RDW: 14 % (ref 11.5–15.5)
WBC: 10.8 10*3/uL — ABNORMAL HIGH (ref 4.0–10.5)

## 2012-05-18 MED ORDER — FLEET ENEMA 7-19 GM/118ML RE ENEM: 1.0000 | ENEMA | RECTAL | Status: DC | PRN

## 2012-05-18 MED ORDER — ONDANSETRON HCL 4 MG/2ML IJ SOLN: 4.0000 mg | Freq: Four times a day (QID) | INTRAMUSCULAR | Status: DC | PRN

## 2012-05-18 MED ORDER — DIPHENHYDRAMINE HCL 50 MG/ML IJ SOLN
12.5000 mg | INTRAMUSCULAR | Status: DC | PRN
  Administered 2012-05-19: 12.5 mg via INTRAVENOUS
  Filled 2012-05-18: qty 1

## 2012-05-18 MED ORDER — LACTATED RINGERS IV SOLN: 500.0000 mL | INTRAVENOUS | Status: DC | PRN

## 2012-05-18 MED ORDER — CITRIC ACID-SODIUM CITRATE 334-500 MG/5ML PO SOLN: 30.0000 mL | ORAL | Status: DC | PRN

## 2012-05-18 MED ORDER — FENTANYL 2.5 MCG/ML BUPIVACAINE 1/10 % EPIDURAL INFUSION (WH - ANES)
14.0000 mL/h | INTRAMUSCULAR | Status: DC
  Filled 2012-05-18: qty 60

## 2012-05-18 MED ORDER — LIDOCAINE-EPINEPHRINE (PF) 2 %-1:200000 IJ SOLN
INTRAMUSCULAR | Status: DC | PRN
  Administered 2012-05-18: 4 mL via EPIDURAL

## 2012-05-18 MED ORDER — OXYTOCIN 40 UNITS IN LACTATED RINGERS INFUSION - SIMPLE MED
62.5000 mL/h | Freq: Once | INTRAVENOUS | Status: DC
  Filled 2012-05-18: qty 1000

## 2012-05-18 MED ORDER — OXYTOCIN BOLUS FROM INFUSION
500.0000 mL | Freq: Once | INTRAVENOUS | Status: DC
  Filled 2012-05-18: qty 500

## 2012-05-18 MED ORDER — LACTATED RINGERS IV SOLN
INTRAVENOUS | Status: DC
  Administered 2012-05-18: 22:00:00 via INTRAVENOUS

## 2012-05-18 MED ORDER — PHENYLEPHRINE 40 MCG/ML (10ML) SYRINGE FOR IV PUSH (FOR BLOOD PRESSURE SUPPORT): 80.0000 ug | PREFILLED_SYRINGE | INTRAVENOUS | Status: DC | PRN

## 2012-05-18 MED ORDER — PHENYLEPHRINE 40 MCG/ML (10ML) SYRINGE FOR IV PUSH (FOR BLOOD PRESSURE SUPPORT)
80.0000 ug | PREFILLED_SYRINGE | INTRAVENOUS | Status: DC | PRN
  Filled 2012-05-18: qty 5

## 2012-05-18 MED ORDER — FENTANYL 2.5 MCG/ML BUPIVACAINE 1/10 % EPIDURAL INFUSION (WH - ANES)
INTRAMUSCULAR | Status: DC | PRN
  Administered 2012-05-18: 14 mL/h via EPIDURAL

## 2012-05-18 MED ORDER — IBUPROFEN 600 MG PO TABS
600.0000 mg | ORAL_TABLET | Freq: Four times a day (QID) | ORAL | Status: DC | PRN
  Filled 2012-05-18 (×9): qty 1

## 2012-05-18 MED ORDER — LACTATED RINGERS IV SOLN: 500.0000 mL | Freq: Once | INTRAVENOUS | Status: DC

## 2012-05-18 MED ORDER — LIDOCAINE HCL (PF) 1 % IJ SOLN
30.0000 mL | INTRAMUSCULAR | Status: DC | PRN
Start: 2012-05-18 — End: 2012-05-19
  Filled 2012-05-18: qty 30

## 2012-05-18 MED ORDER — EPHEDRINE 5 MG/ML INJ: 10.0000 mg | INTRAVENOUS | Status: DC | PRN

## 2012-05-18 MED ORDER — ACETAMINOPHEN 325 MG PO TABS: 650.0000 mg | ORAL_TABLET | ORAL | Status: DC | PRN

## 2012-05-18 MED ORDER — OXYCODONE-ACETAMINOPHEN 5-325 MG PO TABS
1.0000 | ORAL_TABLET | ORAL | Status: DC | PRN
  Administered 2012-05-19 – 2012-05-20 (×4): 1 via ORAL
  Administered 2012-05-20 – 2012-05-21 (×3): 2 via ORAL
  Filled 2012-05-18 (×2): qty 2
  Filled 2012-05-18 (×2): qty 1
  Filled 2012-05-18 (×4): qty 2

## 2012-05-18 MED ORDER — BUTORPHANOL TARTRATE 1 MG/ML IJ SOLN: 1.0000 mg | INTRAMUSCULAR | Status: DC | PRN

## 2012-05-18 MED ORDER — SODIUM CHLORIDE 0.9 % IV SOLN
2.0000 g | Freq: Once | INTRAVENOUS | Status: DC
  Filled 2012-05-18: qty 2000

## 2012-05-18 MED ORDER — EPHEDRINE 5 MG/ML INJ
10.0000 mg | INTRAVENOUS | Status: DC | PRN
  Filled 2012-05-18: qty 4

## 2012-05-18 NOTE — Anesthesia Procedure Notes (Signed)
Epidural Patient location during procedure: OB  Preanesthetic Checklist Completed: patient identified, site marked, surgical consent, pre-op evaluation, timeout performed, IV checked, risks and benefits discussed and monitors and equipment checked  Epidural Patient position: sitting Prep: site prepped and draped and DuraPrep Patient monitoring: continuous pulse ox and blood pressure Approach: midline Injection technique: LOR air  Needle:  Needle type: Tuohy  Needle gauge: 17 G Needle length: 9 cm and 9 Needle insertion depth: 5 cm cm Catheter type: closed end flexible Catheter size: 19 Gauge Catheter at skin depth: 10 cm Test dose: negative  Assessment Events: blood not aspirated, injection not painful, no injection resistance, negative IV test and no paresthesia  Additional Notes Dosing of Epidural:  1st dose, through needle ............................................. epi 1:200K + Xylocaine 40 mg  2nd dose, through catheter, after waiting 3 minutes.....epi 1:200K + Xylocaine 40 mg  3rd dose, through catheter after waiting 3 minutes .............................Marcaine   4mg   ( mg Marcaine are expressed as equivilent  cc's medication removed from the 0.1%Bupiv / fentanyl syringe from L&D pump)  ( 2% Xylo charted as a single dose in Epic Meds for ease of charting; actual dosing was fractionated as above, for saftey's sake)  As each dose occurred, patient was free of IV sx; and patient exhibited no evidence of SA injection.  Patient is more comfortable after epidural dosed. Please see RN's note for documentation of vital signs,and FHR which are stable.  Patient reminded not to try to ambulate with numb legs, and that an RN must be present the 1st time she attempts to get up.    

## 2012-05-18 NOTE — MAU Note (Signed)
States she has been contracting strong for the past 40 min.

## 2012-05-18 NOTE — Anesthesia Preprocedure Evaluation (Addendum)
Anesthesia Evaluation  Patient identified by MRN, date of birth, ID band Patient awake    Reviewed: Allergy & Precautions, H&P , Patient's Chart, lab work & pertinent test results  Airway Mallampati: II TM Distance: >3 FB Neck ROM: full    Dental  (+) Teeth Intact   Pulmonary  HX of Bronchitis breath sounds clear to auscultation        Cardiovascular Rhythm:regular Rate:Normal     Neuro/Psych    GI/Hepatic   Endo/Other    Renal/GU      Musculoskeletal   Abdominal   Peds  Hematology   Anesthesia Other Findings       Reproductive/Obstetrics (+) Pregnancy                          Anesthesia Physical Anesthesia Plan  ASA: II  Anesthesia Plan: Epidural   Post-op Pain Management:    Induction:   Airway Management Planned:   Additional Equipment:   Intra-op Plan:   Post-operative Plan:   Informed Consent: I have reviewed the patients History and Physical, chart, labs and discussed the procedure including the risks, benefits and alternatives for the proposed anesthesia with the patient or authorized representative who has indicated his/her understanding and acceptance.   Dental Advisory Given  Plan Discussed with:   Anesthesia Plan Comments: (Labs checked- platelets confirmed with RN in room. Fetal heart tracing, per RN, reported to be stable enough for sitting procedure. Discussed epidural, and patient consents to the procedure:  included risk of possible headache,backache, failed block, allergic reaction, and nerve injury. This patient was asked if she had any questions or concerns before the procedure started. )        Anesthesia Quick Evaluation

## 2012-05-19 ENCOUNTER — Encounter (HOSPITAL_COMMUNITY): Payer: Self-pay | Admitting: *Deleted

## 2012-05-19 LAB — CBC
HCT: 30 % — ABNORMAL LOW (ref 36.0–46.0)
Hemoglobin: 9.9 g/dL — ABNORMAL LOW (ref 12.0–15.0)
RBC: 3.36 MIL/uL — ABNORMAL LOW (ref 3.87–5.11)
RDW: 14.2 % (ref 11.5–15.5)
WBC: 14.1 10*3/uL — ABNORMAL HIGH (ref 4.0–10.5)

## 2012-05-19 LAB — RPR: RPR Ser Ql: NONREACTIVE

## 2012-05-19 MED ORDER — SENNOSIDES-DOCUSATE SODIUM 8.6-50 MG PO TABS
2.0000 | ORAL_TABLET | Freq: Every day | ORAL | Status: DC
  Administered 2012-05-19 – 2012-05-20 (×2): 2 via ORAL

## 2012-05-19 MED ORDER — ONDANSETRON HCL 4 MG PO TABS: 4.0000 mg | ORAL_TABLET | ORAL | Status: DC | PRN

## 2012-05-19 MED ORDER — BENZOCAINE-MENTHOL 20-0.5 % EX AERO: 1.0000 "application " | INHALATION_SPRAY | CUTANEOUS | Status: DC | PRN

## 2012-05-19 MED ORDER — ZOLPIDEM TARTRATE 5 MG PO TABS: 5.0000 mg | ORAL_TABLET | Freq: Every evening | ORAL | Status: DC | PRN

## 2012-05-19 MED ORDER — DIBUCAINE 1 % RE OINT: 1.0000 "application " | TOPICAL_OINTMENT | RECTAL | Status: DC | PRN

## 2012-05-19 MED ORDER — OXYCODONE-ACETAMINOPHEN 5-325 MG PO TABS
1.0000 | ORAL_TABLET | ORAL | Status: DC | PRN
  Administered 2012-05-19: 1 via ORAL
  Administered 2012-05-20: 2 via ORAL
  Filled 2012-05-19: qty 1

## 2012-05-19 MED ORDER — WITCH HAZEL-GLYCERIN EX PADS: 1.0000 "application " | MEDICATED_PAD | CUTANEOUS | Status: DC | PRN

## 2012-05-19 MED ORDER — PRENATAL MULTIVITAMIN CH
1.0000 | ORAL_TABLET | Freq: Every day | ORAL | Status: DC
  Administered 2012-05-19 – 2012-05-21 (×3): 1 via ORAL
  Filled 2012-05-19 (×3): qty 1

## 2012-05-19 MED ORDER — SIMETHICONE 80 MG PO CHEW: 80.0000 mg | CHEWABLE_TABLET | ORAL | Status: DC | PRN

## 2012-05-19 MED ORDER — FERROUS SULFATE 325 (65 FE) MG PO TABS
325.0000 mg | ORAL_TABLET | Freq: Two times a day (BID) | ORAL | Status: DC
  Administered 2012-05-19 – 2012-05-21 (×5): 325 mg via ORAL
  Filled 2012-05-19 (×5): qty 1

## 2012-05-19 MED ORDER — TETANUS-DIPHTH-ACELL PERTUSSIS 5-2.5-18.5 LF-MCG/0.5 IM SUSP
0.5000 mL | Freq: Once | INTRAMUSCULAR | Status: AC
  Administered 2012-05-20: 0.5 mL via INTRAMUSCULAR
  Filled 2012-05-19: qty 0.5

## 2012-05-19 MED ORDER — IBUPROFEN 600 MG PO TABS
600.0000 mg | ORAL_TABLET | Freq: Four times a day (QID) | ORAL | Status: DC
  Administered 2012-05-19 – 2012-05-21 (×9): 600 mg via ORAL

## 2012-05-19 MED ORDER — LANOLIN HYDROUS EX OINT: TOPICAL_OINTMENT | CUTANEOUS | Status: DC | PRN

## 2012-05-19 MED ORDER — ONDANSETRON HCL 4 MG/2ML IJ SOLN: 4.0000 mg | INTRAMUSCULAR | Status: DC | PRN

## 2012-05-19 MED ORDER — DIPHENHYDRAMINE HCL 25 MG PO CAPS: 25.0000 mg | ORAL_CAPSULE | Freq: Four times a day (QID) | ORAL | Status: DC | PRN

## 2012-05-19 NOTE — Progress Notes (Signed)
UR Chart review completed.  

## 2012-05-19 NOTE — Anesthesia Postprocedure Evaluation (Signed)
  Anesthesia Post-op Note  Patient: Kristi Marshall  Procedure(s) Performed: * No procedures listed *  Patient Location: [facility]  Anesthesia Type: Epidural  Level of Consciousness: awake, alert  and oriented  Airway and Oxygen Therapy: Patient Spontanous Breathing  Post-op Pain: none  Post-op Assessment: Post-op Vital signs reviewed and Patient's Cardiovascular Status Stable  Post-op Vital Signs: Reviewed and stable  Complications: No apparent anesthesia complications

## 2012-05-19 NOTE — Progress Notes (Signed)
Post Partum Day 0 Subjective: no complaints  Objective: Blood pressure 124/69, pulse 106, temperature 97.4 F (36.3 C), temperature source Oral, resp. rate 18, height 5\' 4"  (1.626 m), weight 88.451 kg (195 lb), last menstrual period 08/17/2011, SpO2 98.00%, unknown if currently breastfeeding.  Physical Exam:  General: alert and no distress Lochia: appropriate Uterine Fundus: firm Incision: none DVT Evaluation: No evidence of DVT seen on physical exam.   Basename 05/19/12 0520 05/18/12 2225  HGB 9.9* 10.6*  HCT 30.0* 32.5*    Assessment/Plan: Plan for discharge tomorrow   LOS: 1 day   Kristi Marshall A 05/19/2012, 7:58 AM

## 2012-05-19 NOTE — H&P (Signed)
This is Dr. Francoise Ceo dictating the history and physical on blank blank she's a 26 year old gravida 6 para 3023 who was EDC is 910 she's 39 weeks and 4 days positive GBS she got ampicillin 2 g IV on admission the patient was 5 cm 100% and progressed rapidly and had a normal vaginal liver of a female Apgar 1 from in OA position placenta was spontaneous intact there was no episiotomy or laceration Past medical history negative Past surgical history negative Social history negative System review negative Physical exam revealed a well-developed female postpartum HEENT negative Breasts negative Lungs clear to P&A Heart regular rhythm no murmurs no gallops Abdomen 20 week postpartum Extremities negative and

## 2012-05-20 ENCOUNTER — Encounter (HOSPITAL_COMMUNITY): Payer: Self-pay | Admitting: *Deleted

## 2012-05-20 NOTE — Plan of Care (Signed)
Problem: Phase I Progression Outcomes Goal: Voiding adequately Outcome: Completed/Met Date Met:  05/20/12 Voiding large amts without difficuty Goal: OOB as tolerated unless otherwise ordered Outcome: Completed/Met Date Met:  05/20/12 Tolerates ambulation well.  Problem: Phase II Progression Outcomes Goal: Pain controlled on oral analgesia Outcome: Completed/Met Date Met:  05/20/12 Good pain control with Percocet.  Problem: Discharge Progression Outcomes Goal: Discharge plan in place and appropriate Outcome: Completed/Met Date Met:  05/20/12 Good pain control Knows when to call MD Self care infant care

## 2012-05-20 NOTE — Progress Notes (Signed)
Patient ID: Kristi Marshall, female   DOB: [birth year], 26 y.o.   MRN: [id number] Post Partum Day 1 S/P spontaneous vaginal RH status/Rubella reviewed.  Feeding: bottle Subjective: No HA, SOB, CP, F/C, breast symptoms. Normal vaginal bleeding, no clots.     Objective: BP 111/49  Pulse 73  Temp 97.6 F (36.4 C) (Oral)  Resp 16  Ht 5\' 4"  (1.626 m)  Wt 88.451 kg (195 lb)  BMI 33.47 kg/m2  SpO2 100%  LMP 08/17/2011  Breastfeeding? Unknown   Physical Exam:  General: alert Lochia: appropriate Uterine Fundus: firm DVT Evaluation: No evidence of DVT seen on physical exam. Ext: No c/c/e  Basename 05/19/12 0520 05/18/12 2225  HGB 9.9* 10.6*  HCT 30.0* 32.5*      Assessment/Plan: 26 y.o.  PPD #1 .  normal postpartum exam Continue current postpartum care  Ambulate   LOS: 2 days   JACKSON-MOORE,Emarie Paul A 05/20/2012, 10:42 AM

## 2012-05-21 DIAGNOSIS — O99019 Anemia complicating pregnancy, unspecified trimester: Secondary | ICD-10-CM | POA: Diagnosis present

## 2012-05-21 MED ORDER — FERROUS SULFATE 325 (65 FE) MG PO TABS: 325.0000 mg | ORAL_TABLET | Freq: Two times a day (BID) | ORAL | Status: DC

## 2012-05-21 MED ORDER — PRENATAL MULTIVITAMIN CH: 1.0000 | ORAL_TABLET | Freq: Every morning | ORAL | Status: DC

## 2012-05-21 MED ORDER — ALBUTEROL SULFATE HFA 108 (90 BASE) MCG/ACT IN AERS: 2.0000 | INHALATION_SPRAY | Freq: Four times a day (QID) | RESPIRATORY_TRACT | Status: DC | PRN

## 2012-05-21 MED ORDER — HYDROCODONE-ACETAMINOPHEN 7.5-500 MG PO TABS: 1.0000 | ORAL_TABLET | Freq: Four times a day (QID) | ORAL | Status: DC | PRN

## 2012-05-21 NOTE — Discharge Summary (Signed)
  Obstetric Discharge Summary Reason for Admission: onset of labor Prenatal Procedures: none Intrapartum Procedures: spontaneous vaginal delivery Postpartum Procedures: none Complications-Operative and Postpartum: none  Hemoglobin  Date Value Range Status  05/19/2012 9.9* 12.0 - 15.0 g/dL Final     HCT  Date Value Range Status  05/19/2012 30.0* 36.0 - 46.0 % Final    Physical Exam:  General: alert Lochia: appropriate Uterine: firm Incision: n/a DVT Evaluation: No evidence of DVT seen on physical exam.  Discharge Diagnoses: Active Problems:  Normal delivery  Anemia complicating pregnancy   Discharge Information: Date: 05/21/2012 Activity: pelvic rest Diet: routine Medications:  Prior to Admission medications   Medication Sig Start Date End Date Taking? Authorizing Provider  albuterol (PROVENTIL HFA;VENTOLIN HFA) 108 (90 BASE) MCG/ACT inhaler Inhale 2 puffs into the lungs every 6 (six) hours as needed. For shortness of breath 05/21/12   Antionette Char, MD  ferrous sulfate 325 (65 FE) MG tablet Take 1 tablet (325 mg total) by mouth 2 (two) times daily with a meal. 05/21/12 05/21/13  Antionette Char, MD  HYDROcodone-acetaminophen (LORTAB) 7.5-500 MG per tablet Take 1 tablet by mouth every 6 (six) hours as needed. For pain 05/21/12   Antionette Char, MD  Prenatal Vit-Fe Fumarate-FA (PRENATAL MULTIVITAMIN) TABS Take 1 tablet by mouth every morning. 05/21/12   Antionette Char, MD    Condition: stable Instructions: refer to routine discharge instructions Discharge to: home Follow-up Information    Follow up with HARPER,CHARLES A, MD. Schedule an appointment as soon as possible for a visit in 6 weeks.   Contact information:   [address] [place] [address] [phone]          Newborn Data: Live born  Information for the patient's newborn:  Landrey, Mahurin [[id number]]  female  Home with mother.  JACKSON-MOORE,Storm Dulski A 05/21/2012,  9:33 AM

## 2012-05-21 NOTE — Progress Notes (Signed)
Discharge instructions for mom and baby reviewed with mom.  Able to "Teach Back" home care for both and signs and symptoms to report to MD for both.  No home equipment needed.  Ambulated to car with staff without incident and discharged home with significant other and family.

## 2012-05-25 ENCOUNTER — Inpatient Hospital Stay (HOSPITAL_COMMUNITY)
Admission: AD | Admit: 2012-05-25 | Discharge: 2012-05-26 | DRG: 776 | Disposition: A | Discharge status: Home / Self Care | Payer: Medicaid Other | Source: Ambulatory Visit | Attending: Obstetrics & Gynecology | Admitting: Obstetrics & Gynecology

## 2012-05-25 ENCOUNTER — Encounter (HOSPITAL_COMMUNITY): Payer: Self-pay | Admitting: *Deleted

## 2012-05-25 DIAGNOSIS — N10 Acute tubulo-interstitial nephritis: Secondary | ICD-10-CM

## 2012-05-25 DIAGNOSIS — O2390 Unspecified genitourinary tract infection in pregnancy, unspecified trimester: Principal | ICD-10-CM | POA: Diagnosis present

## 2012-05-25 LAB — COMPREHENSIVE METABOLIC PANEL
ALT: 19 U/L (ref 0–35)
AST: 19 U/L (ref 0–37)
CO2: 22 mEq/L (ref 19–32)
Chloride: 103 mEq/L (ref 96–112)
Creatinine, Ser: 0.6 mg/dL (ref 0.50–1.10)
GFR calc non Af Amer: >90 mL/min (ref >90)
Glucose, Bld: 145 mg/dL — ABNORMAL HIGH (ref 70–99)
Total Bilirubin: 0.5 mg/dL (ref 0.3–1.2)

## 2012-05-25 LAB — URINE MICROSCOPIC-ADD ON

## 2012-05-25 LAB — URINALYSIS, ROUTINE W REFLEX MICROSCOPIC
Glucose, UA: NEGATIVE mg/dL
Specific Gravity, Urine: 1.015 (ref 1.005–1.030)
Urobilinogen, UA: 4 mg/dL — ABNORMAL HIGH (ref 0.0–1.0)
pH: 5.5 (ref 5.0–8.0)

## 2012-05-25 LAB — CBC
Hemoglobin: 10.5 g/dL — ABNORMAL LOW (ref 12.0–15.0)
MCV: 89.7 fL (ref 78.0–100.0)
Platelets: 326 10*3/uL (ref 150–400)
RBC: 3.59 MIL/uL — ABNORMAL LOW (ref 3.87–5.11)
WBC: 11.5 10*3/uL — ABNORMAL HIGH (ref 4.0–10.5)

## 2012-05-25 MED ORDER — PRENATAL MULTIVITAMIN CH
1.0000 | ORAL_TABLET | Freq: Every day | ORAL | Status: DC
Start: 2012-05-25 — End: 2012-05-26
  Administered 2012-05-25: 1 via ORAL
  Filled 2012-05-25: qty 1

## 2012-05-25 MED ORDER — ACETAMINOPHEN 500 MG PO TABS
500.0000 mg | ORAL_TABLET | Freq: Four times a day (QID) | ORAL | Status: DC | PRN
  Administered 2012-05-25: 500 mg via ORAL
  Filled 2012-05-25 (×2): qty 1

## 2012-05-25 MED ORDER — OXYCODONE-ACETAMINOPHEN 5-325 MG PO TABS
1.0000 | ORAL_TABLET | Freq: Four times a day (QID) | ORAL | Status: DC | PRN
  Administered 2012-05-25: 2 via ORAL
  Administered 2012-05-25 – 2012-05-26 (×2): 1 via ORAL
  Filled 2012-05-25: qty 1
  Filled 2012-05-25: qty 2
  Filled 2012-05-25 (×2): qty 1

## 2012-05-25 MED ORDER — LACTATED RINGERS IV BOLUS (SEPSIS)
250.0000 mL | Freq: Once | INTRAVENOUS | Status: AC
  Administered 2012-05-25: 250 mL via INTRAVENOUS

## 2012-05-25 MED ORDER — COMPLETENATE 29-1 MG PO CHEW
1.0000 | CHEWABLE_TABLET | Freq: Every day | ORAL | Status: DC
  Administered 2012-05-25: 1 via ORAL
  Filled 2012-05-25: qty 1

## 2012-05-25 MED ORDER — DEXTROSE 5 % IV SOLN
1.0000 g | INTRAVENOUS | Status: DC
  Administered 2012-05-25: 1 g via INTRAVENOUS
  Filled 2012-05-25: qty 10

## 2012-05-25 MED ORDER — IBUPROFEN 600 MG PO TABS
600.0000 mg | ORAL_TABLET | Freq: Four times a day (QID) | ORAL | Status: DC | PRN
  Administered 2012-05-25: 600 mg via ORAL
  Filled 2012-05-25: qty 1

## 2012-05-25 MED ORDER — DEXTROSE 5 % IV SOLN
1.0000 g | Freq: Two times a day (BID) | INTRAVENOUS | Status: DC
  Administered 2012-05-25: 1 g via INTRAVENOUS
  Filled 2012-05-25 (×2): qty 10

## 2012-05-25 MED ORDER — ACETAMINOPHEN 500 MG PO TABS
1000.0000 mg | ORAL_TABLET | Freq: Once | ORAL | Status: AC
  Administered 2012-05-25: 1000 mg via ORAL
  Filled 2012-05-25: qty 2

## 2012-05-25 NOTE — Progress Notes (Signed)
UR Chart review completed.  

## 2012-05-25 NOTE — MAU Note (Signed)
Pt states that she has had chills since 0130 am this morning with back pain, stomach pain, and sharp pains when she urinates.

## 2012-05-25 NOTE — Progress Notes (Signed)
Patient ID: Kristi Marshall, female   DOB: October 26, 1985, 26 y.o.   MRN: [id number] Hospital Day: 1  S: Preterm labor symptoms: dysuria, low back pain and urinary frequency  O: Blood pressure 114/59, pulse 86, temperature 99 F (37.2 C), temperature source Oral, resp. rate 20, height 5\' 4"  (1.626 m), weight 80.196 kg (176 lb 12.8 oz), last menstrual period 08/17/2011, SpO2 99.00%, not currently breastfeeding.   WUJ:WJXBJYNW: 150 bpm Toco: None SVE:   A/P- 26 y.o. admitted with:  Acute pyelonephritis.  Stable.  Continue IV antibiotics.  Patient Active Hospital Problem List: No active hospital problems.   Pregnancy Complications: none  Preterm labor management: no treatment necessary Dating:  [redacted]w[redacted]d PNL Needed:  none FWB:  good PTL:  none

## 2012-05-25 NOTE — MAU Provider Note (Signed)
Chief Complaint: Fever   First Provider Initiated Contact with Patient 05/25/12 0353     SUBJECTIVE HPI: Kristi Marshall is a 26 y.o. [id number] at 6 days post uncomplicated NSVD who presents via EMS reporting one-day history of fever and chills. She has dysuria described as a sharp pain whenever she urinates and has right-sided flank pain. She also has urinary frequency and some urgency. Denies gross hematuria, shortness of breath, cough or upper abdominal pain. No nausea or vomiting.BMs normal. She is bottlefeeding.  Lochia scant, no malodor.   Past Medical History  Diagnosis Date  . Anxiety   . PIH (pregnancy induced hypertension)   . Bronchitis   . Seasonal allergies   . Pregnancy induced hypertension   . Seizure     pseudoseizures; stress related   OB History    Grav Para Term Preterm Abortions TAB SAB Ect Mult Living   6 4 4  0 2 2 0 0 0 4     # Outc Date GA Lbr Len/2nd Wgt Sex Del Anes PTL Lv   1 TRM 2/05   6lb9oz(2.977kg) F SVD None Yes    Comments: Pre-eclampsia, pt was induced.    2 TRM 6/06 [redacted]w[redacted]d  7lb9oz(3.43kg) F SVD None No Yes   3 TRM 3/09 [redacted]w[redacted]d  6lb8oz(2.948kg) F SVD None No Yes   4 TRM 9/13 [redacted]w[redacted]d 178:06 / 00:26 6lb12.8oz(3.085kg) M SVD EPI  Yes   5 TAB            6 TAB              Past Surgical History  Procedure Date  . Abortions   . Dilation and curettage of uterus     ? retained POC    History   Social History  . Marital Status: Single    Spouse Name: N/A    Number of Children: N/A  . Years of Education: N/A   Occupational History  . Not on file.   Social History Main Topics  . Smoking status: Current Some Day Smoker    Types: Cigarettes    Last Attempt to Quit: 01/08/2012  . Smokeless tobacco: Never Used   Comment: rare with preg  . Alcohol Use: No  . Drug Use: No  . Sexually Active: Yes     pregnant   Other Topics Concern  . Not on file   Social History Narrative  . No narrative on file   No current facility-administered medications  on file prior to encounter.   Current Outpatient Prescriptions on File Prior to Encounter  Medication Sig Dispense Refill  . albuterol (PROVENTIL HFA;VENTOLIN HFA) 108 (90 BASE) MCG/ACT inhaler Inhale 2 puffs into the lungs every 6 (six) hours as needed. For shortness of breath  1 Inhaler  2  . ferrous sulfate 325 (65 FE) MG tablet Take 1 tablet (325 mg total) by mouth 2 (two) times daily with a meal.  60 tablet  5  . HYDROcodone-acetaminophen (LORTAB) 7.5-500 MG per tablet Take 1 tablet by mouth every 6 (six) hours as needed. For pain  30 tablet  0  . Prenatal Vit-Fe Fumarate-FA (PRENATAL MULTIVITAMIN) TABS Take 1 tablet by mouth every morning.  60 tablet  3   No Known Allergies  ROS: Pertinent items in HPI  OBJECTIVE Blood pressure 99/55, pulse 128, temperature 103.9 F (39.9 C), temperature source Oral, resp. rate 18, height 5\' 4"  (1.626 m), weight 89.812 kg (198 lb), last menstrual period 08/17/2011, SpO2 97.00%. BP recheck 115/56  GENERAL: Well-developed, well-nourished female in mild distress; looks tired SKIN: warm, dry HEENT: Normocephalic HEART: Tachycardic,soft Gr 1 systolic murmur RESP: normal effort, CTA bilaterally ABDOMEN: Soft, ND, minimally tender suprapubic region, non-tender at fundus, well involuted BACK: right CVAT mild to moderate EXTREMITIES: Nontender, no edema MENTAL STATUS: Alert and oriented, appropriate  LAB RESULTS Results for orders placed during the hospital encounter of 05/25/12 (from the past 24 hour(s))  URINALYSIS, ROUTINE W REFLEX MICROSCOPIC     Status: Abnormal   Collection Time   05/25/12  2:45 AM      Component Value Range   Color, Urine YELLOW  YELLOW   APPearance HAZY (*) CLEAR   Specific Gravity, Urine 1.015  1.005 - 1.030   pH 5.5  5.0 - 8.0   Glucose, UA NEGATIVE  NEGATIVE mg/dL   Hgb urine dipstick LARGE (*) NEGATIVE   Bilirubin Urine NEGATIVE  NEGATIVE   Ketones, ur NEGATIVE  NEGATIVE mg/dL   Protein, ur 30 (*) NEGATIVE mg/dL    Urobilinogen, UA 4.0 (*) 0.0 - 1.0 mg/dL   Nitrite POSITIVE (*) NEGATIVE   Leukocytes, UA MODERATE (*) NEGATIVE  URINE MICROSCOPIC-ADD ON     Status: Abnormal   Collection Time   05/25/12  2:45 AM      Component Value Range   Squamous Epithelial / LPF RARE  RARE   WBC, UA 11-20  <3 WBC/hpf   RBC / HPF 3-6  <3 RBC/hpf   Bacteria, UA FEW (*) RARE  CBC     Status: Abnormal   Collection Time   05/25/12  3:20 AM      Component Value Range   WBC 11.5 (*) 4.0 - 10.5 K/uL   RBC 3.59 (*) 3.87 - 5.11 MIL/uL   Hemoglobin 10.5 (*) 12.0 - 15.0 g/dL   HCT 40.9 (*) 81.1 - 91.4 %   MCV 89.7  78.0 - 100.0 fL   MCH 29.2  26.0 - 34.0 pg   MCHC 32.6  30.0 - 36.0 g/dL   RDW 78.2  95.6 - 21.3 %   Platelets 326  150 - 400 K/uL  CMP pending  ASSESSMENT 1. Acute pyelonephritis   26 yo [id number] PP NSVD  PLAN C/W Dr. Tamela Oddi: Admit for IV antibiotics    Danae Orleans, CNM 05/25/2012  4:20 AM

## 2012-05-26 MED ORDER — OXYCODONE-ACETAMINOPHEN 5-325 MG PO TABS: 1.0000 | ORAL_TABLET | ORAL | Status: AC | PRN

## 2012-05-26 MED ORDER — CEFUROXIME AXETIL 500 MG PO TABS: 500.0000 mg | ORAL_TABLET | Freq: Two times a day (BID) | ORAL | Status: AC

## 2012-05-26 NOTE — Progress Notes (Signed)
Post Partum Day 10 Subjective: no complaints.  No pain.  Objective: Blood pressure 127/86, pulse 60, temperature 98.2 F (36.8 C), temperature source Oral, resp. rate 18, height 5\' 4"  (1.626 m), weight 80.196 kg (176 lb 12.8 oz), SpO2 98.00%, not currently breastfeeding.  Physical Exam:  General: alert and no distress Lochia: appropriate Uterine Fundus: firm Incision: none DVT Evaluation: No evidence of DVT seen on physical exam. Back:  No CVA tenderness  Basename 05/25/12 0320  HGB 10.5*  HCT 32.2*    Assessment/Plan: Discharge home.  Pyelonephritis, resolving.  Gram + rods on culture.   LOS: 1 day   HARPER,CHARLES A 05/26/2012, 8:56 AM

## 2012-05-27 LAB — URINE CULTURE: Special Requests: NORMAL

## 2012-05-28 LAB — CULTURE, BLOOD (ROUTINE X 2)

## 2012-05-31 LAB — CULTURE, BLOOD (ROUTINE X 2): Culture: NO GROWTH

## 2012-06-07 NOTE — Discharge Summary (Signed)
Physician Discharge Summary  Patient ID: Kristi Marshall MRN: [id number] DOB/AGE: 1985-11-02 26 y.o.  Admit date: 05/25/2012 Discharge date: 06/07/2012  Admission Diagnoses:  Pyelonephritis  Discharge Diagnoses:  Pyelonephritis.  Improved. Active Problems:  * No active hospital problems. *    Discharged Condition: good  Hospital Course:  Six days postpartum NSVD.  Admitted with 2 day H/O fever, chills, back pain and dysuria.  Responded well to IV antibiotics.  Discharged home with no pain, much improved.  Urine culture grew out gram + rods.  Consults: None  Significant Diagnostic Studies: labs: Urine Culture  Treatments: IV hydration, antibiotics: ceftriaxone and analgesia: Dilaudid  Discharge Exam: Blood pressure 127/86, pulse 60, temperature 98.2 F (36.8 C), temperature source Oral, resp. rate 18, height 5\' 4"  (1.626 m), weight 80.196 kg (176 lb 12.8 oz), SpO2 98.00%, not currently breastfeeding. Back:  No flank tenderness.  Disposition: 01-Home or Self Care  Discharge Orders    Future Appointments: Provider: Department: Dept Phone: Center:   06/27/2012 1:45 PM Ardyth Gal, MD [facility] [phone] [facility]     Future Orders Please Complete By Expires   Discharge patient          Medication List     As of 06/07/2012  4:32 AM    TAKE these medications         albuterol 108 (90 BASE) MCG/ACT inhaler   Commonly known as: PROVENTIL HFA;VENTOLIN HFA   Inhale 2 puffs into the lungs every 6 (six) hours as needed. For shortness of breath      oxyCODONE-acetaminophen 5-325 MG per tablet   Commonly known as: PERCOCET/ROXICET   Take 1 tablet by mouth every 4 (four) hours as needed. pain      prenatal multivitamin Tabs   Take 1 tablet by mouth every morning.           Follow-up Information    Follow up with Asmar Brozek A, MD. Schedule an appointment as soon as possible for a visit in 2 weeks.   Contact information:   [address]  [address] [place] [address] [phone]          Signed: Scottlynn Lindell A 06/07/2012, 4:32 AM

## 2012-06-14 NOTE — H&P (Signed)
Kristi Marshall is an 26 y.o. female. Presents with right flank pain, dysuria and fever.  NSVD 10 days ago, uncomplicated.  Pertinent Gynecological History: Menses: flow is light Bleeding: light lochia Contraception: none DES exposure: denies Blood transfusions: none Sexually transmitted diseases: no past history Previous GYN Procedures: DNC  Last mammogram: n/a Date: n/a Last pap: normal Date: 2013 OB History: G4, P2   Menstrual History: Menarche age: 57 Patient's last menstrual period was 08/17/2011.    Past Medical History  Diagnosis Date  . Anxiety   . PIH (pregnancy induced hypertension)   . Bronchitis   . Seasonal allergies   . Pregnancy induced hypertension   . Seizure     pseudoseizures; stress related    Past Surgical History  Procedure Date  . Abortions   . Dilation and curettage of uterus     ? retained POC     Family History  Problem Relation Age of Onset  . Hypertension Mother   . Diabetes Maternal Aunt   . Hypertension Maternal Grandmother   . Diabetes Maternal Grandmother   . Heart disease Maternal Grandmother   . Hypertension Maternal Grandfather   . Heart disease Maternal Grandfather   . Anesthesia problems Neg Hx   . Hypotension Neg Hx   . Malignant hyperthermia Neg Hx   . Pseudochol deficiency Neg Hx   . Other Neg Hx     Social History:  reports that she has been smoking Cigarettes.  She has never used smokeless tobacco. She reports that she does not drink alcohol or use illicit drugs.  Allergies: No Known Allergies  No prescriptions prior to admission    Review of Systems  Constitutional: Positive for fever and chills.  Genitourinary: Positive for dysuria, urgency, frequency and flank pain.  All other systems reviewed and are negative.    Blood pressure 118/74, pulse 102, temperature 98 F (36.7 C), temperature source Oral, resp. rate 18, height 5\' 4"  (1.626 m), weight 84.369 kg (186 lb), last menstrual period 08/17/2011, not  currently breastfeeding. Physical Exam  Nursing note and vitals reviewed. Constitutional: She is oriented to person, place, and time. She appears well-developed and well-nourished.  HENT:  Head: Normocephalic and atraumatic.  Eyes: Conjunctivae normal are normal. Pupils are equal, round, and reactive to light.  Neck: Normal range of motion. Neck supple.  Cardiovascular: Normal rate and regular rhythm.   Respiratory: Effort normal.  GI: Soft.  Musculoskeletal: Normal range of motion.  Neurological: She is alert and oriented to person, place, and time.  Skin: Skin is warm and dry.  Psychiatric: She has a normal mood and affect. Her behavior is normal. Judgment and thought content normal.    No results found for this or any previous visit (from the past 24 hour(s)).  No results found.  Assessment/Plan: Pyelonephritis.  Admit.  IV antibiotics.  Kristi Marshall A 06/14/2012, 4:24 PM

## 2012-06-20 ENCOUNTER — Encounter (HOSPITAL_COMMUNITY): Payer: Self-pay

## 2012-06-20 ENCOUNTER — Observation Stay (HOSPITAL_COMMUNITY): Payer: Medicaid Other

## 2012-06-20 ENCOUNTER — Observation Stay (HOSPITAL_COMMUNITY)
Admission: EM | Admit: 2012-06-20 | Discharge: 2012-06-20 | Disposition: A | Discharge status: Home / Self Care | Payer: Medicaid Other | Attending: Family Medicine | Admitting: Family Medicine

## 2012-06-20 ENCOUNTER — Emergency Department (HOSPITAL_COMMUNITY): Payer: Medicaid Other

## 2012-06-20 DIAGNOSIS — R569 Unspecified convulsions: Principal | ICD-10-CM

## 2012-06-20 DIAGNOSIS — F411 Generalized anxiety disorder: Secondary | ICD-10-CM | POA: Insufficient documentation

## 2012-06-20 LAB — CBC
MCV: 88.3 fL (ref 78.0–100.0)
Platelets: 267 10*3/uL (ref 150–400)
Platelets: 256 10*3/uL (ref 150–400)
RBC: 4.1 MIL/uL (ref 3.87–5.11)
RBC: 4.16 MIL/uL (ref 3.87–5.11)
RDW: 14.7 % (ref 11.5–15.5)
WBC: 7 10*3/uL (ref 4.0–10.5)
WBC: 5.8 10*3/uL (ref 4.0–10.5)

## 2012-06-20 LAB — URINALYSIS, ROUTINE W REFLEX MICROSCOPIC
Bilirubin Urine: NEGATIVE
Hgb urine dipstick: NEGATIVE
Ketones, ur: NEGATIVE mg/dL
Protein, ur: NEGATIVE mg/dL
Urobilinogen, UA: 1 mg/dL (ref 0.0–1.0)

## 2012-06-20 LAB — BASIC METABOLIC PANEL
BUN: 6 mg/dL (ref 6–23)
Calcium: 8.5 mg/dL (ref 8.4–10.5)
Creatinine, Ser: 0.54 mg/dL (ref 0.50–1.10)
GFR calc Af Amer: >90 mL/min (ref >90)
GFR calc non Af Amer: >90 mL/min (ref >90)
Potassium: 3.7 mEq/L (ref 3.5–5.1)

## 2012-06-20 LAB — RAPID URINE DRUG SCREEN, HOSP PERFORMED
Amphetamines: NOT DETECTED
Barbiturates: NOT DETECTED
Benzodiazepines: POSITIVE — AB
Tetrahydrocannabinol: NOT DETECTED

## 2012-06-20 LAB — COMPREHENSIVE METABOLIC PANEL
ALT: 19 U/L (ref 0–35)
AST: 19 U/L (ref 0–37)
Alkaline Phosphatase: 127 U/L — ABNORMAL HIGH (ref 39–117)
CO2: 23 mEq/L (ref 19–32)
Chloride: 112 mEq/L (ref 96–112)
Creatinine, Ser: 0.58 mg/dL (ref 0.50–1.10)
GFR calc non Af Amer: >90 mL/min (ref >90)
Potassium: 3.4 mEq/L — ABNORMAL LOW (ref 3.5–5.1)
Total Bilirubin: 0.1 mg/dL — ABNORMAL LOW (ref 0.3–1.2)

## 2012-06-20 LAB — MAGNESIUM: Magnesium: 1.9 mg/dL (ref 1.5–2.5)

## 2012-06-20 MED ORDER — LEVETIRACETAM 500 MG PO TABS: 500.0000 mg | ORAL_TABLET | Freq: Two times a day (BID) | ORAL | Status: DC

## 2012-06-20 MED ORDER — ALBUTEROL SULFATE HFA 108 (90 BASE) MCG/ACT IN AERS: 2.0000 | INHALATION_SPRAY | Freq: Four times a day (QID) | RESPIRATORY_TRACT | Status: DC | PRN

## 2012-06-20 MED ORDER — SODIUM CHLORIDE 0.9 % IV SOLN
1000.0000 mg | Freq: Once | INTRAVENOUS | Status: AC
  Administered 2012-06-20: 1000 mg via INTRAVENOUS
  Filled 2012-06-20: qty 10

## 2012-06-20 MED ORDER — ACETAMINOPHEN 650 MG RE SUPP: 650.0000 mg | Freq: Four times a day (QID) | RECTAL | Status: DC | PRN

## 2012-06-20 MED ORDER — ACETAMINOPHEN 325 MG PO TABS
650.0000 mg | ORAL_TABLET | Freq: Four times a day (QID) | ORAL | Status: DC | PRN
  Administered 2012-06-20: 650 mg via ORAL
  Filled 2012-06-20: qty 2

## 2012-06-20 MED ORDER — POTASSIUM CHLORIDE 10 MEQ/100ML IV SOLN
10.0000 meq | INTRAVENOUS | Status: AC
  Administered 2012-06-20 (×2): 10 meq via INTRAVENOUS
  Filled 2012-06-20 (×2): qty 100

## 2012-06-20 MED ORDER — LEVETIRACETAM 500 MG PO TABS
500.0000 mg | ORAL_TABLET | Freq: Two times a day (BID) | ORAL | Status: DC
  Filled 2012-06-20: qty 1

## 2012-06-20 MED ORDER — HEPARIN SODIUM (PORCINE) 5000 UNIT/ML IJ SOLN
5000.0000 [IU] | Freq: Three times a day (TID) | INTRAMUSCULAR | Status: DC
  Filled 2012-06-20 (×3): qty 1

## 2012-06-20 MED ORDER — SODIUM CHLORIDE 0.9 % IV SOLN
INTRAVENOUS | Status: DC
  Administered 2012-06-20: 01:00:00 via INTRAVENOUS

## 2012-06-20 MED ORDER — IBUPROFEN 800 MG PO TABS
800.0000 mg | ORAL_TABLET | Freq: Once | ORAL | Status: DC
Start: 2012-06-20 — End: 2012-06-20
  Filled 2012-06-20 (×3): qty 1

## 2012-06-20 MED ORDER — PRENATAL MULTIVITAMIN CH
1.0000 | ORAL_TABLET | Freq: Every morning | ORAL | Status: DC
  Administered 2012-06-20: 1 via ORAL
  Filled 2012-06-20: qty 1

## 2012-06-20 MED ORDER — LORAZEPAM 2 MG/ML IJ SOLN: 1.0000 mg | INTRAMUSCULAR | Status: DC | PRN

## 2012-06-20 MED ORDER — SODIUM CHLORIDE 0.9 % IJ SOLN: 3.0000 mL | Freq: Two times a day (BID) | INTRAMUSCULAR | Status: DC

## 2012-06-20 MED ORDER — DEXTROSE-NACL 5-0.45 % IV SOLN
INTRAVENOUS | Status: DC
  Administered 2012-06-20: 08:00:00 via INTRAVENOUS
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
## 2012-06-20 NOTE — Progress Notes (Signed)
Primary Care Provider Note:   I have visited and examined my patient Kristi Marshall.  She is neurologically in-tact this afternoon but still drowsy.  She tells me that she does not remember any of last nights events- she went to the bathroom and then woke up here this am.  She says that she has never been on seizure medication, because the first time she had "seizures" she was pregnant, and then she got pregnant again soon.  She says she was supposed to follow up with the Neurologist but never did.  She mentions that she says sometimes the "seizures" happen when she drinks alcohol- for example one time she had had about 4 mixed drinks and this happened.  However she says it is not always with alcohol.  Last night she says she had about 2 cups of beer.    The patient says she is very concerned about her children and needs to go home- her infant is being cared for by her brother who does not know much about babies.  I reassured her that Neurology should see her today to help sort out what is going.  Thanks to the [facility] Teaching [facility] for the care of my patient, I look forward to seeing her in clinic soon.   Kristi Marshall 06/20/2012 1:40 PM Pager: [phone]

## 2012-06-20 NOTE — Progress Notes (Signed)
Routine EEG completed.  

## 2012-06-20 NOTE — ED Notes (Signed)
Per Pts family: Pt has hx of seizures x 5 years but doesn't take any medication. "She stopped having seizures, so she stopped her RX. Her doctor said she has seizures due to stress." Last known seizure was appx 1 month during pregnancy.

## 2012-06-20 NOTE — H&P (Signed)
[facility] Attending Admission Note: Denny Levy MD [phone] pager office [phone] I  have seen and examined this patient, reviewed their chart. I have discussed this patient with the resident. I agree with the resident's findings, assessment and care plan. On my exam this morning she repeatedly falls asleep but is arousable. She relates to me that she felt well and normal yesterday. Went to the bathroom and "tripped" over something on the floor. She recalls next a vague recollection of being loaded into the ambulance and nothing else until she was interviewed by Dr. Durene Cal last night. It is very unclear if she had true seizure type ativity at home. No loss continence, no trauma noted (bruising, lacerations etc). There is not a description of her "seizure activity" in teh ambulance that necessitated them giving her versed. Since, she has been very somnolent. The Ed physician is referring to this as a "post ictal " state, but it may be nothing more than a response to versed and then addition of keppra. I would recommend we stop all sedating medicines at this time. We had ordered an EEG--it is unclear to me if teh medicines she has received will impact this test. We have conulted neurology this am for further clarification and evaluation.

## 2012-06-20 NOTE — Discharge Summary (Signed)
Physician Discharge Summary  Patient ID: Kristi Marshall MRN: [id number] DOB: November 03, 1985 Age: 26 y.o.  Admit date: 06/20/2012 Discharge date: 06/21/2012  PCP: Ardyth Gal, MD  Consultants: Neurology    Discharge Diagnosis: Principal Problem:  *Seizure-like activity Secondary Problems History of Pseudoseizure   Hospital Course Kristi Marshall is a 26 y.o. year old female with a history of pseudoseizures presenting with seizure like activity witnessed by EMS with seizures unable to be ruled out. Please see H+P for full details but please note that specifics of events varied when patient provided them to different providers and no one who directly saw the seizures was available to aid in history taking. Patient per mother has a 9 year history of pseudoseizures for which she has had EEG but missed outpatient neurology appointments for followup and has never been treated with medication.  1. Seizure like activity-patient reportedly with recurrent seizure like ativity per EMS per ED physician though specific activity could not be uncovered. Patient received Versed in route and there was no recurrence of seizure. Concern of fall at home so patient received CT head and neck.  CT head negative for acute process or bleed. UA did not have protein as patient was postpartum 4 weeks so no concern for preeclampsia. Cervical spine cleared and collar removed per ED physician. Patient received IV keppra load in ED. Patient reportedly with prolonged postictal phase though it was unclear whether patient was just tired or due to benzodiazepine and Keppra. Neurology was consulted. EEG was ordered. EEG unremarkable but patient was on Keppra at time. Could not rule out seizures definitively so patient discharge home on Keppra with plans for overnight EEG. WBC and CPK were ordered and were not elevated despite reported grand mal seizures with prolonged duration.     Procedures/Imaging:   CT Head without  contrast, 10/8 @0146  IMPRESSION:  1. Normal noncontrast appearance of the brain. No acute traumatic  injury identified.  2. Cervical spine findings are below.  CT Cervical Spine w/o contrast, 10/8 @0146  IMPRESSION:  No acute fracture or listhesis identified in the cervical spine.  Ligamentous injury is not excluded.  Procedures:  EEG, 10/8 History: 26 yo F with possible seizures   Sedation: None  Background: The majority of this recording consisted of normal  sleep. There is a brief arousal during which a posterior dominant  rhythm of 10 Hz is seen.   Photic stimulation: Physiologic driving is present  EEG Diagnosis: Normal waking and sleep EEG  Clinical Interpretation: This normal EEG is recorded in the  waking and sleep states. There was no seizure or seizure  predisposition recorded on this study.   Ritta Slot, MD  Labs  CBC  Lab 06/20/12 0630 06/20/12 0122  WBC 5.8 7.0  HGB 12.0 12.0  HCT 37.2 36.2  PLT 256 267   BMET  Lab 06/20/12 0630 06/20/12 0122  NA 144 146*  K 3.7 3.4*  CL 112 112  CO2 23 23  BUN 6 8  CREATININE 0.54 0.58  CALCIUM 8.5 8.9  PROT -- 7.2  BILITOT -- 0.1*  ALKPHOS -- 127*  ALT -- 19  AST -- 19  GLUCOSE 99 96    Results for orders placed during the hospital encounter of 06/20/12 (from the past 72 hour(s))  URINALYSIS, ROUTINE W REFLEX MICROSCOPIC     Status: Abnormal   Collection Time   06/20/12  1:08 AM      Component Value Range Comment   Color, Urine YELLOW  YELLOW    APPearance CLEAR  CLEAR    Specific Gravity, Urine 1.004 (*) 1.005 - 1.030    pH 6.0  5.0 - 8.0    Glucose, UA NEGATIVE  NEGATIVE mg/dL    Hgb urine dipstick NEGATIVE  NEGATIVE    Bilirubin Urine NEGATIVE  NEGATIVE    Ketones, ur NEGATIVE  NEGATIVE mg/dL    Protein, ur NEGATIVE  NEGATIVE mg/dL    Urobilinogen, UA 1.0  0.0 - 1.0 mg/dL    Nitrite NEGATIVE  NEGATIVE    Leukocytes, UA NEGATIVE  NEGATIVE MICROSCOPIC NOT DONE ON URINES WITH NEGATIVE  PROTEIN, BLOOD, LEUKOCYTES, NITRITE, OR GLUCOSE <1000 mg/dL.  URINE RAPID DRUG SCREEN (HOSP PERFORMED)     Status: Abnormal   Collection Time   06/20/12  5:53 AM      Component Value Range Comment   Opiates NONE DETECTED  NONE DETECTED    Cocaine NONE DETECTED  NONE DETECTED    Benzodiazepines POSITIVE (*) NONE DETECTED    Amphetamines NONE DETECTED  NONE DETECTED    Tetrahydrocannabinol NONE DETECTED  NONE DETECTED    Barbiturates NONE DETECTED  NONE DETECTED   CBC     Status: Normal   Collection Time   06/20/12  6:30 AM      Component Value Range Comment   WBC 5.8  4.0 - 10.5 K/uL    RBC 4.16  3.87 - 5.11 MIL/uL    Hemoglobin 12.0  12.0 - 15.0 g/dL    HCT 40.9  81.1 - 91.4 %    MCV 89.4  78.0 - 100.0 fL    MCH 28.8  26.0 - 34.0 pg    MCHC 32.3  30.0 - 36.0 g/dL    RDW 78.2  95.6 - 21.3 %    Platelets 256  150 - 400 K/uL   MAGNESIUM     Status: Normal   Collection Time   06/20/12  6:30 AM      Component Value Range Comment   Magnesium 1.9  1.5 - 2.5 mg/dL   CK     Status: Normal   Collection Time   06/20/12  6:30 AM      Component Value Range Comment   Total CK 71  7 - 177 U/L      Patient condition at time of discharge/disposition: stable  Disposition-home   Follow up issues: 1. Seizures/pseudoseizures - EEG showed no evidence of seizure or predisposition to seizure. Per Dr. Amada Jupiter, however, pt does not definitely have a seizure disorder but he cannot ultimately rule out seizures. Pt started on Keppra 500 mg BID. Dr. Amada Jupiter would like to refer pt to a center like [facility] that has an epileptologists and an "EMU" (epilepsy monitoring unit) for possible overnight or sleep-deprived EEG. 2. Attempted to place order for ambulatory referral to neurology and have contacted blue team to make referral appointment time.    Discharge follow up:  Follow-up Information    Follow up with [facility], [place]. On 06/23/2012. (Appointment time is 10:15 AM.)    Contact  information:   [address] [place] [address] [phone]         Discharge Orders    Future Appointments: Provider: Department: Dept Phone: Center:   06/23/2012 10:15 AM Ardyth Gal, MD [facility] [phone] [facility]   06/27/2012 1:45 PM Ardyth Gal, MD [facility] [phone] [facility]      Discharge Medications  Annabell Howells S  Home Medication Instructions [id number]  Printed on:06/21/12 1427  Medication Information                    albuterol (PROVENTIL HFA;VENTOLIN HFA) 108 (90 BASE) MCG/ACT inhaler Inhale 2 puffs into the lungs every 6 (six) hours as needed. For shortness of breath           oxyCODONE-acetaminophen (PERCOCET/ROXICET) 5-325 MG per tablet Take 1 tablet by mouth every 8 (eight) hours as needed. For pain / cramps           levETIRAcetam (KEPPRA) 500 MG tablet Take 1 tablet (500 mg total) by mouth 2 (two) times daily.            Tana Conch, MD of [facility] 06/21/2012 2:27 PM

## 2012-06-20 NOTE — ED Notes (Signed)
Dr. Opitz at bedside. 

## 2012-06-20 NOTE — H&P (Signed)
Family Medicine Teaching [facility] Admission History and Physical Service Pager: [phone]  Patient name: Kristi Marshall Medical record number: [id number] Date of birth: November 10, 1985 Age: 26 y.o. Gender: female  Primary Care Provider: Ardyth Gal, MD  Chief Complaint: seizure like activity  Assessment and Plan: Kristi Marshall is a 26 y.o. year old female with a history of pseudoseizures presenting with seizure like activity.   1. Seizure like activity-patient reportedly with recurrent grand mal seizures per EMS per ED physician. Patient s/p Versed when transported by EMS with reported prolonged postictal state though difficult to define as unclear when seizure occurred. Patient could be postictal or just affected by benzodiazepine. Also, patient could just be tired/heavy sleeper given timing of ED evaluation. These could be pseudoseizures as previously reported. Could consider preeclampsia but no protein on urinalysis although patient is hypertensive on 1 reading. Normal neuro exam. Suspect seizure activity less likely given no loss of bowel bladder, persistent shaking without periods between, history of pseudoseizures.  1. S/p IV keppra load in ED 2. CT head negative for acute process or bleed. Cervical spine cleared and collar removed per ED physician.  3. Will consult neurology in the AM 4. EEG ordered 5. No WBC elevation. Have also ordered CPK which is often elevated in grand mal seizures which patient is reported to have 6. UDS 7. Ativan prn at bedside. Telemetry floor, have asked to be paged if seizure activity before use. Seizure precautions.  2. Elevated BP-will monitor 3. FEN/GI: NPO for now until more awake, IVF, repeat BMET in AM after hydration and potassium repletion. Will hydrate with d5 1/2 ns once finished NS. Consider advancing diet in AM.  4. Prophylaxis: Heparin SQ 5. Disposition: pending further evaluation 6. Code Status: full code  History of Present  Illness: Kristi Marshall is a 26 y.o. year old female presenting with seizure like activity.  Level 5 caveat applies as patient difficult to arouse but was able to provide some history primarily of seizure history. Patient states normal state of health today and was at her house on her porch and that is the last thing she remembers. Reports she has never been medicated for seizures. States seizures started in pregnancy and she could not be on meds due to being pregnant but states never saw a neurologist. No current complaints. No history headache, fever, chills, nausea vomiting, overall sick feelings.   Able to discuss medical history briefly with patient and confirm with mother who I spoke with by phone. Mother Ms. Weible [phone] extension [phone]. Mother was not at home when events occurred but was passed on message that patient was "shaking all over with eyes rolled back" for several minutes so EMS was called. Patient had just went to use the bathroom when family heard her fall on the ground (no bruising or bleeding) just before they noted seizure activity. Mother denies loss of bowel or bladder. Mother states this is her first seizure in over a year and that she had none while pregnant. This is corroborated by prenatal record which does not discuss any seizure activity over last 9 months. Mother says never on seizure medications and has been told by a psychiatrist that these are "stress seizures" and she is not followed by neurology. Mother states seizures have been going on for 8-9 years especially in periods of high stress but she cannot remember particular stessors on patient now though patient does have a 50 week old child. Patient is bottle feeding her 4 week  old child.   Patient transported by EMS and received Versed due to recurrent seizures though details of seizures not reported. Patient was reportedly postictal once arrived in ED with a prolonged postictal phase also reported so family medicine  consulted for admission. Of note, UA did not show any pro  Patient Active Problem List  Diagnosis  . THYROMEGALY  . TOBACCO USER  . ALLERGIC RHINITIS  . UNSPECIFIED VAGINITIS AND VULVOVAGINITIS  . SEIZURE DISORDER   Past Medical History: Past Medical History  Diagnosis Date  . Anxiety   . PIH (pregnancy induced hypertension)   . Bronchitis   . Seasonal allergies   . Pregnancy induced hypertension   . Seizure     pseudoseizures; stress related  No history Preeclampsia Past Surgical History: Past Surgical History  Procedure Date  . Abortions   . Dilation and curettage of uterus     ? retained POC    Social History: History  Substance Use Topics  . Smoking status: Current Some Day Smoker    Types: Cigarettes    Last Attempt to Quit: 01/08/2012  . Smokeless tobacco: Never Used   Comment: rare with preg  . Alcohol Use: No   For any additional social history documentation, please refer to relevant sections of EMR.  Family History: Family History  Problem Relation Age of Onset  . Hypertension Mother   . Diabetes Maternal Aunt   . Hypertension Maternal Grandmother   . Diabetes Maternal Grandmother   . Heart disease Maternal Grandmother   . Hypertension Maternal Grandfather   . Heart disease Maternal Grandfather   . Anesthesia problems Neg Hx   . Hypotension Neg Hx   . Malignant hyperthermia Neg Hx   . Pseudochol deficiency Neg Hx   . Other Neg Hx   No history of seizures.   Allergies: No Known Allergies No current facility-administered medications on file prior to encounter.   Current Outpatient Prescriptions on File Prior to Encounter  Medication Sig Dispense Refill  . albuterol (PROVENTIL HFA;VENTOLIN HFA) 108 (90 BASE) MCG/ACT inhaler Inhale 2 puffs into the lungs every 6 (six) hours as needed. For shortness of breath  1 Inhaler  2  . oxyCODONE-acetaminophen (PERCOCET/ROXICET) 5-325 MG per tablet Take 1 tablet by mouth every 4 (four) hours as needed. pain       . Prenatal Vit-Fe Fumarate-FA (PRENATAL MULTIVITAMIN) TABS Take 1 tablet by mouth every morning.  60 tablet  3   Review Of Systems: Per HPI   Physical Exam: BP 150/101  Pulse 75  Temp 98.6 F (37 C) (Oral)  SpO2 100%  Breastfeeding? No Gen: NAD, resting comfortably in bed, very sleepy but awakens persistent efforts and patietn able to sit up and participate in exam and history HEENT: NCAT, slightly dry mucus membranes, PERRLA  CV: RRR no mrg  Lungs: CTAB  Abd: soft/nontender/nondistended/normal bowel sounds  MSK: moves all extremities, no edema  Skin: warm and dry, no rash  Neuro: CN II-XII intact, sensation and reflexes normal throughout, 5/5 muscle strength in bilateral upper and lower extremities. Normal finger to nose. Normal rapid alternating movements.   Labs and Imaging: Results for orders placed during the hospital encounter of 06/20/12 (from the past 24 hour(s))  URINALYSIS, ROUTINE W REFLEX MICROSCOPIC     Status: Abnormal   Collection Time   06/20/12  1:08 AM      Component Value Range   Color, Urine YELLOW  YELLOW   APPearance CLEAR  CLEAR   Specific Gravity,  Urine 1.004 (*) 1.005 - 1.030   pH 6.0  5.0 - 8.0   Glucose, UA NEGATIVE  NEGATIVE mg/dL   Hgb urine dipstick NEGATIVE  NEGATIVE   Bilirubin Urine NEGATIVE  NEGATIVE   Ketones, ur NEGATIVE  NEGATIVE mg/dL   Protein, ur NEGATIVE  NEGATIVE mg/dL   Urobilinogen, UA 1.0  0.0 - 1.0 mg/dL   Nitrite NEGATIVE  NEGATIVE   Leukocytes, UA NEGATIVE  NEGATIVE  PREGNANCY, URINE     Status: Normal   Collection Time   06/20/12  1:08 AM      Component Value Range   Preg Test, Ur NEGATIVE  NEGATIVE  CBC     Status: Normal   Collection Time   06/20/12  1:22 AM      Component Value Range   WBC 7.0  4.0 - 10.5 K/uL   RBC 4.10  3.87 - 5.11 MIL/uL   Hemoglobin 12.0  12.0 - 15.0 g/dL   HCT 16.1  09.6 - 04.5 %   MCV 88.3  78.0 - 100.0 fL   MCH 29.3  26.0 - 34.0 pg   MCHC 33.1  30.0 - 36.0 g/dL   RDW 40.9  81.1 - 91.4 %     Platelets 267  150 - 400 K/uL  COMPREHENSIVE METABOLIC PANEL     Status: Abnormal   Collection Time   06/20/12  1:22 AM      Component Value Range   Sodium 146 (*) 135 - 145 mEq/L   Potassium 3.4 (*) 3.5 - 5.1 mEq/L   Chloride 112  96 - 112 mEq/L   CO2 23  19 - 32 mEq/L   Glucose, Bld 96  70 - 99 mg/dL   BUN 8  6 - 23 mg/dL   Creatinine, Ser 7.82  0.50 - 1.10 mg/dL   Calcium 8.9  8.4 - 95.6 mg/dL   Total Protein 7.2  6.0 - 8.3 g/dL   Albumin 3.4 (*) 3.5 - 5.2 g/dL   AST 19  0 - 37 U/L   ALT 19  0 - 35 U/L   Alkaline Phosphatase 127 (*) 39 - 117 U/L   Total Bilirubin 0.1 (*) 0.3 - 1.2 mg/dL   GFR calc non Af Amer >90  >90 mL/min   GFR calc Af Amer >90  >90 mL/min   Ct Head Wo Contrast  06/20/2012  *RADIOLOGY REPORT*  Clinical Data:  26 year old female status post seizure. Unresponsive.  CT HEAD WITHOUT CONTRAST CT CERVICAL SPINE WITHOUT CONTRAST  Technique:  Multidetector CT imaging of the head and cervical spine was performed following the standard protocol without intravenous contrast.  Multiplanar CT image reconstructions of the cervical spine were also generated.  Comparison:  Head CT 12/13/2008.  CT HEAD  Findings: Small left maxillary sinus mucous retention cyst.  Minor paranasal sinus mucosal thickening elsewhere.  Mastoids and tympanic cavities are clear.  Calvarium intact.  No scalp hematoma identified. Visualized orbit soft tissues are within normal limits.  Normal cerebral volume.  No ventriculomegaly. Gray-white matter differentiation is within normal limits throughout the brain.  No midline shift, mass effect, or evidence of mass lesion.  No suspicious intracranial vascular hyperdensity. No evidence of cortically based acute infarction identified.  No acute intracranial hemorrhage identified.  IMPRESSION: 1. Normal noncontrast appearance of the brain.  No acute traumatic injury identified. 2.  Cervical spine findings are below.    CT CERVICAL SPINE  Findings: Straightening of  cervical lordosis. Visualized skull base is intact.  No atlanto-occipital dissociation.  Cervicothoracic junction alignment is within normal limits.  Bilateral posterior element alignment is within normal limits.  Minimal motion artifact.  No acute cervical fracture.  Small C7 cervical ribs incidentally noted.  Lung apices are clear. Visualized paraspinal soft tissues are within normal limits.  IMPRESSION: No acute fracture or listhesis identified in the cervical spine. Ligamentous injury is not excluded.   Original Report Authenticated By: Harley Hallmark, M.D.     Tana Conch, MD, PGY2 06/20/2012 5:15 AM

## 2012-06-20 NOTE — ED Notes (Signed)
Per EMS: Pt had GM seizure this evening, witnessed by family. Pt has hx of seizure, but takes no meds. Pt had on and off GM seizure x 20 minutes. EMS gave 5mg  Versed IM. CBG 101. Pt had recent dx of kidney infection and is 4 wks post delivery with no complications. Pt postictal with EMS, but at this time is responsive.
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
## 2012-06-20 NOTE — Procedures (Signed)
History: 26 yo F with possible seizures   Sedation: None  Background: The majority of this recording consisted of normal sleep. There is a brief arousal during which a posterior dominant rhythm of 10 Hz is seen.   Photic stimulation: Physiologic driving is present  EEG Diagnosis: Normal waking and sleep EEG  Clinical Interpretation: This normal EEG is recorded in the waking and sleep states. There was no seizure or seizure predisposition recorded on this study.   Ritta Slot, MD [facility] [phone]  If 7pm- 7am, please page neurology on call at [phone].

## 2012-06-20 NOTE — ED Notes (Signed)
Admitting md at bedside

## 2012-06-20 NOTE — ED Notes (Signed)
C-collar removed by Dr Opitz 

## 2012-06-20 NOTE — ED Provider Notes (Signed)
History     CSN: [id number]  Arrival date & time 06/20/12  0053   First MD Initiated Contact with Patient 06/20/12 0055      Chief Complaint  Patient presents with  . Seizures    (Consider location/radiation/quality/duration/timing/severity/associated sxs/prior treatment) HPI HX per EMS. PT post ictal and unable to provide any reliable history. Level 5 caveat applies. Sz history and witnessed Sz tonight at home per family. EMS was called. EMS reports return of Sz activity and was given versed. On arrival to ED is post ictal, no apparent trauma. PT transported on board with C spine precautions and collar in place. No bleeding or tongue trauma. No family bedside to provide any further history. PT is 4 week post partum, no known h/o eclampsia. Currently not on Sz meds.  Past Medical History  Diagnosis Date  . Anxiety   . PIH (pregnancy induced hypertension)   . Bronchitis   . Seasonal allergies   . Pregnancy induced hypertension   . Seizure     pseudoseizures; stress related    Past Surgical History  Procedure Date  . Abortions   . Dilation and curettage of uterus     ? retained POC     Family History  Problem Relation Age of Onset  . Hypertension Mother   . Diabetes Maternal Aunt   . Hypertension Maternal Grandmother   . Diabetes Maternal Grandmother   . Heart disease Maternal Grandmother   . Hypertension Maternal Grandfather   . Heart disease Maternal Grandfather   . Anesthesia problems Neg Hx   . Hypotension Neg Hx   . Malignant hyperthermia Neg Hx   . Pseudochol deficiency Neg Hx   . Other Neg Hx     History  Substance Use Topics  . Smoking status: Current Some Day Smoker    Types: Cigarettes    Last Attempt to Quit: 01/08/2012  . Smokeless tobacco: Never Used   Comment: rare with preg  . Alcohol Use: No    OB History    Grav Para Term Preterm Abortions TAB SAB Ect Mult Living   6 4 4  0 2 2 0 0 0 4      Review of Systems  Unable to perform  ROS level 5 caveat as above  Allergies  Review of patient's allergies indicates no known allergies.  Home Medications   Current Outpatient Rx  Name Route Sig Dispense Refill  . ALBUTEROL SULFATE HFA 108 (90 BASE) MCG/ACT IN AERS Inhalation Inhale 2 puffs into the lungs every 6 (six) hours as needed. For shortness of breath 1 Inhaler 2  . OXYCODONE-ACETAMINOPHEN 5-325 MG PO TABS Oral Take 1 tablet by mouth every 4 (four) hours as needed. pain    . PRENATAL MULTIVITAMIN CH Oral Take 1 tablet by mouth every morning. 60 tablet 3    BP 150/101  Pulse 75  Temp 98.6 F (37 C) (Oral)  SpO2 100%  Breastfeeding? No  Physical Exam  Nursing note and vitals reviewed. Constitutional: She appears well-developed and well-nourished.  HENT:  Head: Normocephalic and atraumatic.       No tongue lac  Eyes: Pupils are equal, round, and reactive to light. No scleral icterus.  Neck: No tracheal deviation present.       C collar in place  Cardiovascular: Normal rate, normal heart sounds and intact distal pulses.   Pulmonary/Chest: Effort normal and breath sounds normal. No stridor. No respiratory distress.  Abdominal: Soft. Bowel sounds are normal. She exhibits no  distension. There is no tenderness.  Musculoskeletal: Normal range of motion. She exhibits no edema.  Neurological:       Post ictal, follows limited commands, withdrawls from pain, MAE x 4  Skin: Skin is warm and dry.    ED Course  Procedures (including critical care time)  Labs Reviewed  COMPREHENSIVE METABOLIC PANEL - Abnormal; Notable for the following:    Sodium 146 (*)     Potassium 3.4 (*)     Albumin 3.4 (*)     Alkaline Phosphatase 127 (*)     Total Bilirubin 0.1 (*)     All other components within normal limits  URINALYSIS, ROUTINE W REFLEX MICROSCOPIC - Abnormal; Notable for the following:    Specific Gravity, Urine 1.004 (*)     All other components within normal limits  CBC  PREGNANCY, URINE   Ct Head Wo  Contrast  06/20/2012  *RADIOLOGY REPORT*  Clinical Data:  26 year old female status post seizure. Unresponsive.  CT HEAD WITHOUT CONTRAST CT CERVICAL SPINE WITHOUT CONTRAST  Technique:  Multidetector CT imaging of the head and cervical spine was performed following the standard protocol without intravenous contrast.  Multiplanar CT image reconstructions of the cervical spine were also generated.  Comparison:  Head CT 12/13/2008.  CT HEAD  Findings: Small left maxillary sinus mucous retention cyst.  Minor paranasal sinus mucosal thickening elsewhere.  Mastoids and tympanic cavities are clear.  Calvarium intact.  No scalp hematoma identified. Visualized orbit soft tissues are within normal limits.  Normal cerebral volume.  No ventriculomegaly. Gray-white matter differentiation is within normal limits throughout the brain.  No midline shift, mass effect, or evidence of mass lesion.  No suspicious intracranial vascular hyperdensity. No evidence of cortically based acute infarction identified.  No acute intracranial hemorrhage identified.  IMPRESSION: 1. Normal noncontrast appearance of the brain.  No acute traumatic injury identified. 2.  Cervical spine findings are below.  CT CERVICAL SPINE  Findings: Straightening of cervical lordosis. Visualized skull base is intact.  No atlanto-occipital dissociation.  Cervicothoracic junction alignment is within normal limits.  Bilateral posterior element alignment is within normal limits.  Minimal motion artifact.  No acute cervical fracture.  Small C7 cervical ribs incidentally noted.  Lung apices are clear. Visualized paraspinal soft tissues are within normal limits.  IMPRESSION: No acute fracture or listhesis identified in the cervical spine. Ligamentous injury is not excluded.   Original Report Authenticated By: Harley Hallmark, M.D.    Ct Cervical Spine Wo Contrast  06/20/2012  *RADIOLOGY REPORT*  Clinical Data:  26 year old female status post seizure. Unresponsive.  CT HEAD  WITHOUT CONTRAST CT CERVICAL SPINE WITHOUT CONTRAST  Technique:  Multidetector CT imaging of the head and cervical spine was performed following the standard protocol without intravenous contrast.  Multiplanar CT image reconstructions of the cervical spine were also generated.  Comparison:  Head CT 12/13/2008.  CT HEAD  Findings: Small left maxillary sinus mucous retention cyst.  Minor paranasal sinus mucosal thickening elsewhere.  Mastoids and tympanic cavities are clear.  Calvarium intact.  No scalp hematoma identified. Visualized orbit soft tissues are within normal limits.  Normal cerebral volume.  No ventriculomegaly. Gray-white matter differentiation is within normal limits throughout the brain.  No midline shift, mass effect, or evidence of mass lesion.  No suspicious intracranial vascular hyperdensity. No evidence of cortically based acute infarction identified.  No acute intracranial hemorrhage identified.  IMPRESSION: 1. Normal noncontrast appearance of the brain.  No acute traumatic injury identified. 2.  Cervical spine findings are below.  CT CERVICAL SPINE  Findings: Straightening of cervical lordosis. Visualized skull base is intact.  No atlanto-occipital dissociation.  Cervicothoracic junction alignment is within normal limits.  Bilateral posterior element alignment is within normal limits.  Minimal motion artifact.  No acute cervical fracture.  Small C7 cervical ribs incidentally noted.  Lung apices are clear. Visualized paraspinal soft tissues are within normal limits.  IMPRESSION: No acute fracture or listhesis identified in the cervical spine. Ligamentous injury is not excluded.   Original Report Authenticated By: Harley Hallmark, M.D.     4:47 AM remains post ictal in ED. CT scans and labs reviewed as above. Normal LFTs, no protein in urine. FP consult for evaluation. Unable to determine if PT was previously on SZ medications prior to pregnancy.    IV Keppra initiated. Plan MED admit and NEU  consult prn.    MDM   Witnessed Sz activity on not on seizure meds, recent pregnancy now post partum. Prolonged post ictal state. MED admit        Sunnie Nielsen, MD 06/20/12 [phone]

## 2012-06-22 NOTE — H&P (Signed)
Kristi Marshall is Marshall 26 y.o. female presenting for UC's. Maternal Medical History:  Reason for admission: Reason for admission: contractions.  26 yo G6  P4024.  LMP 08-17-11.  Presents with UC's.  Prenatal Complications - Diabetes: none.    OB History as of 05/25/12    Grav Para Term Preterm Abortions TAB SAB Ect Mult Living   6 4 4  0 2 2 0 0 0 4     Past Medical History  Diagnosis Date  . Anxiety   . PIH (pregnancy induced hypertension)   . Bronchitis   . Seasonal allergies   . Pregnancy induced hypertension   . Seizure     pseudoseizures; stress related   Past Surgical History  Procedure Date  . Abortions   . Dilation and curettage of uterus     ? retained POC    Family History: family history includes Diabetes in her maternal aunt and maternal grandmother; Heart disease in her maternal grandfather and maternal grandmother; and Hypertension in her maternal grandfather, maternal grandmother, and mother.  There is no history of Anesthesia problems, and Hypotension, and Malignant hyperthermia, and Pseudochol deficiency, and Other, . Social History:  reports that she has been smoking Cigarettes.  She has never used smokeless tobacco. She reports that she does not drink alcohol or use illicit drugs.   Prenatal Transfer Tool  Maternal Diabetes: No Genetic Screening: Normal Maternal Ultrasounds/Referrals: Normal Fetal Ultrasounds or other Referrals:  None Maternal Substance Abuse:  No Significant Maternal Medications:  Meds include: Other:  Significant Maternal Lab Results:  Lab values include: Group B Strep negative Other Comments:  None  Review of Systems  All other systems reviewed and are negative.    Dilation: 4 Effacement (%): 70 Station: -2 Exam by:: Dr Henderson Cloud Blood pressure 118/74, pulse 102, temperature 98 F (36.7 C), temperature source Oral, resp. rate 18, height 5\' 4"  (1.626 m), weight 84.369 kg (186 lb), last menstrual period 08/17/2011, not currently  breastfeeding. Maternal Exam:  Uterine Assessment: Contraction strength is moderate.  Abdomen: Patient reports no abdominal tenderness. Fetal presentation: vertex  Introitus: Normal vulva. Normal vagina.  Cervix: Cervix evaluated by digital exam.     Physical Exam  Nursing note and vitals reviewed. Constitutional: She is oriented to person, place, and time. She appears well-developed and well-nourished.  HENT:  Head: Normocephalic and atraumatic.  Eyes: Conjunctivae normal are normal. Pupils are equal, round, and reactive to light.  Neck: Normal range of motion. Neck supple.  Cardiovascular: Normal rate and regular rhythm.   Respiratory: Effort normal.  GI: Soft.  Genitourinary: Vagina normal and uterus normal.  Musculoskeletal: Normal range of motion.  Neurological: She is alert and oriented to person, place, and time.  Skin: Skin is warm and dry.  Psychiatric: She has Marshall normal mood and affect. Her behavior is normal. Judgment and thought content normal.    Prenatal labs: ABO, Rh: AB/POS/-- (01/29 1117) Antibody: NEG (01/29 1117) Rubella: 27.4 (01/29 1117) RPR: NON REACTIVE (09/05 2225)  HBsAg: NEGATIVE (01/29 1117)  HIV: NON REACTIVE (01/29 1117)  GBS: Positive (08/29 0000)   Assessment/Plan: 39 weeks.  UC's.  R/O labor.   Kristi Marshall 06/22/2012, 5:53 AM

## 2012-06-22 NOTE — Discharge Summary (Signed)
Physician Discharge Summary  Patient ID: Kristi Marshall MRN: [id number] DOB/AGE: 1986-07-25 26 y.o.  Admit date: 05/11/2012 Discharge date: 06/22/2012  Admission Diagnoses:  39 weeks.  UC's.  Discharge Diagnoses: Same.  Latent phase. Active Problems:  * No active hospital problems. *    Discharged Condition: good  Hospital Course: Admitted with UC's.  After observation patient made no cervical change.  Not in labor.  Discharged home with instructions.  Consults: None  Significant Diagnostic Studies: labs: RPR  Treatments: IV hydration and analgesia: Nubain  Discharge Exam: Blood pressure 118/74, pulse 102, temperature 98 F (36.7 C), temperature source Oral, resp. rate 18, height 5\' 4"  (1.626 m), weight 84.369 kg (186 lb), last menstrual period 08/17/2011, not currently breastfeeding. General appearance: alert and no distress GI: normal findings: soft, non-tender Extremities: extremities normal, atraumatic, no cyanosis or edema  Disposition: 01-Home or Self Care  Discharge Orders    Future Appointments: Provider: Department: Dept Phone: Center:   06/23/2012 10:15 AM Ardyth Gal, MD [facility] [phone] [facility]   06/27/2012 1:45 PM Ardyth Gal, MD [facility] [phone] [facility]     Future Orders Please Complete By Expires   Diet - low sodium heart healthy      Discharge instructions      Comments:   Routine   Activity as tolerated      Call MD for:      Call MD for:  temperature >100.4      Call MD for:  persistant nausea and vomiting      Call MD for:  severe uncontrolled pain      Call MD for:  redness, tenderness, or signs of infection (pain, swelling, redness, odor or green/yellow discharge around incision site)      Call MD for:  difficulty breathing, headache or visual disturbances      Call MD for:  hives      Call MD for:  persistant dizziness or light-headedness      Call MD for:  extreme fatigue      Discharge patient       OB RESULT CONSOLE Group B Strep      Comments:   This external order was created through the Results Console.       Medication List     As of 06/22/2012  6:00 AM    STOP taking these medications         albuterol 108 (90 BASE) MCG/ACT inhaler   Commonly known as: PROVENTIL HFA;VENTOLIN HFA           Follow-up Information    Follow up with Autie Vasudevan A, MD. Schedule an appointment as soon as possible for a visit in 1 week. (call office today to make appointment for early next week appointment)    Contact information:   [address] [place] [address] [phone]          Signed: Nyshawn Gowdy A 06/22/2012, 6:00 AM

## 2012-06-22 NOTE — Discharge Summary (Signed)
I discussed with Dr Hunter.  I agree with their plans documented in their progress note for today.  

## 2012-06-23 ENCOUNTER — Inpatient Hospital Stay: Payer: Medicaid Other | Admitting: Family Medicine

## 2012-06-27 ENCOUNTER — Ambulatory Visit: Payer: Medicaid Other | Admitting: Family Medicine

## 2012-07-08 ENCOUNTER — Emergency Department (HOSPITAL_COMMUNITY)
Admission: EM | Admit: 2012-07-08 | Discharge: 2012-07-08 | Disposition: A | Discharge status: Home / Self Care | Payer: Medicaid Other | Attending: Emergency Medicine | Admitting: Emergency Medicine

## 2012-07-08 ENCOUNTER — Encounter (HOSPITAL_COMMUNITY): Payer: Self-pay | Admitting: Emergency Medicine

## 2012-07-08 DIAGNOSIS — Z79899 Other long term (current) drug therapy: Secondary | ICD-10-CM | POA: Insufficient documentation

## 2012-07-08 DIAGNOSIS — J40 Bronchitis, not specified as acute or chronic: Secondary | ICD-10-CM | POA: Insufficient documentation

## 2012-07-08 DIAGNOSIS — Z8669 Personal history of other diseases of the nervous system and sense organs: Secondary | ICD-10-CM | POA: Insufficient documentation

## 2012-07-08 DIAGNOSIS — K089 Disorder of teeth and supporting structures, unspecified: Secondary | ICD-10-CM | POA: Insufficient documentation

## 2012-07-08 DIAGNOSIS — K0889 Other specified disorders of teeth and supporting structures: Secondary | ICD-10-CM

## 2012-07-08 DIAGNOSIS — J301 Allergic rhinitis due to pollen: Secondary | ICD-10-CM | POA: Insufficient documentation

## 2012-07-08 DIAGNOSIS — Z8659 Personal history of other mental and behavioral disorders: Secondary | ICD-10-CM | POA: Insufficient documentation

## 2012-07-08 DIAGNOSIS — F17200 Nicotine dependence, unspecified, uncomplicated: Secondary | ICD-10-CM | POA: Insufficient documentation

## 2012-07-08 MED ORDER — HYDROCODONE-ACETAMINOPHEN 5-325 MG PO TABS
2.0000 | ORAL_TABLET | Freq: Once | ORAL | Status: AC
  Administered 2012-07-08: 2 via ORAL
  Filled 2012-07-08: qty 2

## 2012-07-08 MED ORDER — PENICILLIN V POTASSIUM 250 MG PO TABS
500.0000 mg | ORAL_TABLET | Freq: Four times a day (QID) | ORAL | Status: DC
  Administered 2012-07-08: 500 mg via ORAL
  Filled 2012-07-08: qty 2

## 2012-07-08 MED ORDER — HYDROCODONE-ACETAMINOPHEN 5-325 MG PO TABS: ORAL_TABLET | ORAL | Status: DC

## 2012-07-08 MED ORDER — PENICILLIN V POTASSIUM 500 MG PO TABS: 500.0000 mg | ORAL_TABLET | Freq: Four times a day (QID) | ORAL | Status: AC

## 2012-07-08 NOTE — ED Notes (Signed)
PT. STATED, I 'VE HAD A TOOTHACHE FOR 2 WEEKS ACHING ON AND OFF, TODAY WORSE UNABLE TO EAT

## 2012-07-08 NOTE — ED Provider Notes (Cosign Needed)
History   This chart was scribed for Ward Givens, MD, by Frederik Pear. The patient was seen in room TR08C/TR08C and the patient's care was started at 1734.    CSN: [id number]  Arrival date & time 07/08/12  1438   First MD Initiated Contact with Patient 07/08/12 1649      Chief Complaint  Patient presents with  . Dental Pain    (Consider location/radiation/quality/duration/timing/severity/associated sxs/prior treatment) HPI Kristi Marshall is a 26 y.o. female who presents to the Emergency Department complaining of gradually worsening, intermittent dental pain that began 2 weeks ago. She reports that a hole has been present in her tooth for the past year, but the pain became worse today while she was eating a hotdog. The pain is throbbing and aching. She denies any fever or swelling to her face.   Her PCP is Dr[facility] Lula Olszewski at [facility], and she has no other existing medical conditions. She is a current, everyday smoker and occasionally drinks alcohol.  Dentist [facility] (had seen them while pregnant)  Past Medical History  Diagnosis Date  . Anxiety   . PIH (pregnancy induced hypertension)   . Bronchitis   . Seasonal allergies   . Pregnancy induced hypertension   . Seizure     pseudoseizures; stress related    Past Surgical History  Procedure Date  . Abortions   . Dilation and curettage of uterus     ? retained POC    d Family History  Problem Relation Age of Onset  . Hypertension Mother   . Diabetes Maternal Aunt   . Hypertension Maternal Grandmother   . Diabetes Maternal Grandmother   . Heart disease Maternal Grandmother   . Hypertension Maternal Grandfather   . Heart disease Maternal Grandfather   . Anesthesia problems Neg Hx   . Hypotension Neg Hx   . Malignant hyperthermia Neg Hx   . Pseudochol deficiency Neg Hx   . Other Neg Hx     History  Substance Use Topics  . Smoking status: Current Some Day Smoker    Types: Cigarettes     Last Attempt to Quit: 01/08/2012  . Smokeless tobacco: Never Used   Comment: rare with preg  . Alcohol Use: No  6 weeks post partum Starting new job next week  OB History    Grav Para Term Preterm Abortions TAB SAB Ect Mult Living   6 4 4  0 2 2 0 0 0 4      Review of Systems  HENT: Positive for dental problem.   All other systems reviewed and are negative.    Allergies  Review of patient's allergies indicates no known allergies.  Home Medications   Current Outpatient Rx  Name Route Sig Dispense Refill  . ACETAMINOPHEN 500 MG PO TABS Oral Take 500 mg by mouth every 6 (six) hours as needed. pain    . ALBUTEROL SULFATE HFA 108 (90 BASE) MCG/ACT IN AERS Inhalation Inhale 2 puffs into the lungs every 6 (six) hours as needed. For shortness of breath 1 Inhaler 2    BP 134/83  Pulse 86  Temp 98.4 F (36.9 C) (Oral)  Resp 16  SpO2 100%  LMP 07/01/2012  Vital signs normal    Physical Exam  Nursing note and vitals reviewed. Constitutional: She is oriented to person, place, and time. She appears well-developed and well-nourished.  Non-toxic appearance. She does not appear ill. No distress.  HENT:  Head: Normocephalic and atraumatic.  Right Ear: External ear normal.  Left Ear: External ear normal.  Nose: Nose normal. No mucosal edema or rhinorrhea.  Mouth/Throat: Oropharynx is clear and moist and mucous membranes are normal. No dental abscesses or uvula swelling.       No facial swelling. Tooth 16 has a large cavity on the outer aspect. Gums mildly swollen  Eyes: Conjunctivae normal and EOM are normal. Pupils are equal, round, and reactive to light.  Neck: Normal range of motion and full passive range of motion without pain. Neck supple.  Cardiovascular: Normal heart sounds.  Exam reveals no gallop.   No murmur heard. Pulmonary/Chest: Effort normal and breath sounds normal. No respiratory distress. She has no rhonchi. She exhibits no crepitus.  Abdominal: Soft. Normal  appearance and bowel sounds are normal.  Musculoskeletal: Normal range of motion. She exhibits no edema and no tenderness.       Moves all extremities well.   Neurological: She is alert and oriented to person, place, and time. She has normal strength. No cranial nerve deficit.  Skin: Skin is warm, dry and intact. No rash noted. No erythema. No pallor.  Psychiatric: She has a normal mood and affect. Her speech is normal and behavior is normal. Her mood appears not anxious.    ED Course  Procedures (including critical care time)   Medications  HYDROcodone-acetaminophen (NORCO/VICODIN) 5-325 MG per tablet (not administered)  penicillin v potassium (VEETID) 500 MG tablet (not administered)     DIAGNOSTIC STUDIES: Oxygen Saturation is 100% on room air, normal by my interpretation.    COORDINATION OF CARE:  17:36- Discussed planned course of treatment with the patient, including pain medication, who is agreeable at this time.     1. Toothache    New Prescriptions   HYDROCODONE-ACETAMINOPHEN (NORCO/VICODIN) 5-325 MG PER TABLET    Take 1 or 2 po Q 6hrs for pain   PENICILLIN V POTASSIUM (VEETID) 500 MG TABLET    Take 1 tablet (500 mg total) by mouth 4 (four) times daily.    Plan discharge  Devoria Albe, MD, FACEP    MDM   I personally performed the services described in this documentation, which was scribed in my presence. The recorded information has been reviewed and considered.  Devoria Albe, MD, Armando Gang        Ward Givens, MD 07/08/12 [phone]

## 2012-07-08 NOTE — ED Notes (Signed)
Discharge instructions reviewed. Pt verbalized understanding.  

## 2012-07-08 NOTE — ED Notes (Signed)
Pt states she has had a tooth ache for two weeks on and off. She states she has a hole in her top molar tooth on the left side and couldn't get around to making a dentist appt. Rates the pain a 7/10.

## 2012-07-31 ENCOUNTER — Encounter (HOSPITAL_COMMUNITY): Payer: Self-pay | Admitting: *Deleted

## 2012-07-31 ENCOUNTER — Inpatient Hospital Stay (HOSPITAL_COMMUNITY)
Admission: AD | Admit: 2012-07-31 | Discharge: 2012-07-31 | Disposition: A | Discharge status: Home / Self Care | Payer: Medicaid Other | Source: Ambulatory Visit | Attending: Obstetrics & Gynecology | Admitting: Obstetrics & Gynecology

## 2012-07-31 DIAGNOSIS — R030 Elevated blood-pressure reading, without diagnosis of hypertension: Secondary | ICD-10-CM | POA: Diagnosis present

## 2012-07-31 DIAGNOSIS — R109 Unspecified abdominal pain: Secondary | ICD-10-CM | POA: Insufficient documentation

## 2012-07-31 DIAGNOSIS — N920 Excessive and frequent menstruation with regular cycle: Secondary | ICD-10-CM

## 2012-07-31 DIAGNOSIS — N946 Dysmenorrhea, unspecified: Secondary | ICD-10-CM | POA: Diagnosis present

## 2012-07-31 HISTORY — DX: Tubulo-interstitial nephritis, not specified as acute or chronic: N12

## 2012-07-31 LAB — CBC
HCT: 38.1 % (ref 36.0–46.0)
Hemoglobin: 12.4 g/dL (ref 12.0–15.0)
MCH: 29.3 pg (ref 26.0–34.0)
MCHC: 32.5 g/dL (ref 30.0–36.0)
MCV: 90.1 fL (ref 78.0–100.0)
Platelets: 239 10*3/uL (ref 150–400)
RBC: 4.23 MIL/uL (ref 3.87–5.11)
RDW: 14.8 % (ref 11.5–15.5)
WBC: 5.1 10*3/uL (ref 4.0–10.5)

## 2012-07-31 MED ORDER — OXYCODONE-ACETAMINOPHEN 5-325 MG PO TABS: 1.0000 | ORAL_TABLET | Freq: Four times a day (QID) | ORAL | Status: DC | PRN

## 2012-07-31 MED ORDER — OXYCODONE-ACETAMINOPHEN 5-325 MG PO TABS
2.0000 | ORAL_TABLET | ORAL | Status: AC
  Administered 2012-07-31: 2 via ORAL
  Filled 2012-07-31: qty 2

## 2012-07-31 MED ORDER — KETOROLAC TROMETHAMINE 60 MG/2ML IM SOLN
60.0000 mg | INTRAMUSCULAR | Status: AC
  Administered 2012-07-31: 60 mg via INTRAMUSCULAR
  Filled 2012-07-31: qty 2

## 2012-07-31 MED ORDER — IBUPROFEN 600 MG PO TABS: 600.0000 mg | ORAL_TABLET | Freq: Four times a day (QID) | ORAL | Status: DC | PRN

## 2012-07-31 NOTE — MAU Provider Note (Signed)
Chief Complaint: Vaginal Bleeding and Abdominal Pain   First Provider Initiated Contact with Patient 07/31/12 1617     SUBJECTIVE HPI: Kristi Marshall is a 26 y.o. [id number] at Unknown by LMP who presents to maternity admissions reporting a painful heavy period.  She reports this is the first period since having her baby and finishing with her lochia bleeding in September. Her period started 3 days ago, which was at the time it was expected, and she reports soaking through tampons and on to her clothing all day today. She has not been wearing pads for this bleeding.  She is also having severe cramping with this bleeding, more than she has ever had with menses.  She denies vaginal itching/burning, urinary symptoms, h/a, dizziness, n/v, or fever/chills.     Past Medical History  Diagnosis Date  . Anxiety   . PIH (pregnancy induced hypertension)   . Bronchitis   . Seasonal allergies   . Pregnancy induced hypertension   . Seizure     pseudoseizures; stress related  . Pyelonephritis    Past Surgical History  Procedure Date  . Abortions   . Dilation and curettage of uterus     ? retained POC    History   Social History  . Marital Status: Single    Spouse Name: N/A    Number of Children: N/A  . Years of Education: N/A   Occupational History  . Not on file.   Social History Main Topics  . Smoking status: Current Some Day Smoker -- 7 years    Types: Cigarettes  . Smokeless tobacco: Never Used  . Alcohol Use: No  . Drug Use: No  . Sexually Active: Yes     Comment: planning for depo   Other Topics Concern  . Not on file   Social History Narrative  . No narrative on file   No current facility-administered medications on file prior to encounter.   Current Outpatient Prescriptions on File Prior to Encounter  Medication Sig Dispense Refill  . acetaminophen (TYLENOL) 500 MG tablet Take 500 mg by mouth every 6 (six) hours as needed. pain      . albuterol (PROVENTIL HFA;VENTOLIN  HFA) 108 (90 BASE) MCG/ACT inhaler Inhale 2 puffs into the lungs every 6 (six) hours as needed. For shortness of breath  1 Inhaler  2   No Known Allergies  ROS: Pertinent items in HPI  OBJECTIVE Blood pressure 162/103, pulse 56, temperature 99.7 F (37.6 C), temperature source Oral, resp. rate 20, height 5\' 4"  (1.626 m), weight 74.844 kg (165 lb), last menstrual period 07/28/2012, unknown if currently breastfeeding.  Patient Vitals for the past 24 hrs:  BP Temp Temp src Pulse Resp Height Weight  07/31/12 1742 157/101 mmHg - - 54  18  - -  07/31/12 1723 169/106 mmHg - - 50  18  - -  07/31/12 1626 162/103 mmHg - - 56  - - -  07/31/12 1423 137/91 mmHg 99.7 F (37.6 C) Oral 86  20  5\' 4"  (1.626 m) 74.844 kg (165 lb)   GENERAL: Well-developed, well-nourished female in no acute distress.  HEENT: Normocephalic HEART: normal rate RESP: normal effort ABDOMEN: Soft, non-tender EXTREMITIES: Nontender, no edema NEURO: Alert and oriented Pelvic exam: Cervix pink, visually closed, without lesion, pooling of bright red blood in vagina, vaginal walls and external genitalia normal Bimanual exam: Cervix 0/long/high, firm, anterior, neg CMT, uterus nontender, nonenlarged, adnexa without tenderness, enlargement, or mass  LAB RESULTS Results for  orders placed during the hospital encounter of 07/31/12 (from the past 24 hour(s))  POCT PREGNANCY, URINE     Status: Normal   Collection Time   07/31/12  2:26 PM      Component Value Range   Preg Test, Ur NEGATIVE  NEGATIVE  CBC     Status: Normal   Collection Time   07/31/12  3:11 PM      Component Value Range   WBC 5.1  4.0 - 10.5 K/uL   RBC 4.23  3.87 - 5.11 MIL/uL   Hemoglobin 12.4  12.0 - 15.0 g/dL   HCT 16.1  09.6 - 04.5 %   MCV 90.1  78.0 - 100.0 fL   MCH 29.3  26.0 - 34.0 pg   MCHC 32.5  30.0 - 36.0 g/dL   RDW 40.9  81.1 - 91.4 %   Platelets 239  150 - 400 K/uL     ASSESSMENT 1. Menorrhagia   2. Dysmenorrhea     PLAN Discharge  home with bleeding precautions Discussed normal changes in menstrual patterns postpartum Ibuprofen 600 mg Q6 hours Percocet 1-2 tabs Q 6 hours PRN x15 tabs Discussed elevated blood pressures with Dr Erin Fulling F/U with [facility] for blood pressure recheck Return to MAU as needed       Follow-up Information    Follow up with THE [facility] [place] [facility]ERN[facility] ADMISSIONS.   Contact information:   [address] [id number] mc [place] [address] [phone]         Sharen Counter Certified Nurse-Midwife 07/31/2012  4:31 PM

## 2012-07-31 NOTE — MAU Provider Note (Signed)
Attestation of Attending Supervision of Advanced Practitioner (CNM/NP): Evaluation and management procedures were performed by the Advanced Practitioner under my supervision and collaboration.  I have reviewed the Advanced Practitioner's note and chart, and I agree with the management and plan.  HARRAWAY-SMITH, Azusena Erlandson 9:58 PM     

## 2012-07-31 NOTE — MAU Note (Signed)
Period started 3 days ago when expected, has been very heavy ( mess clothes if wait more than an hour ) passing clots and a lot of cramping.

## 2012-08-02 ENCOUNTER — Ambulatory Visit: Payer: Medicaid Other | Admitting: Family Medicine

## 2012-08-03 ENCOUNTER — Telehealth: Payer: Self-pay | Admitting: Advanced Practice Midwife

## 2012-08-07 NOTE — Telephone Encounter (Signed)
error 

## 2012-09-21 ENCOUNTER — Ambulatory Visit: Payer: Medicaid Other | Admitting: Family Medicine

## 2012-09-26 ENCOUNTER — Inpatient Hospital Stay (HOSPITAL_COMMUNITY)
Admission: AD | Admit: 2012-09-26 | Discharge: 2012-09-26 | Disposition: A | Discharge status: Home / Self Care | Payer: Self-pay | Source: Ambulatory Visit | Attending: Obstetrics | Admitting: Obstetrics

## 2012-09-26 ENCOUNTER — Encounter (HOSPITAL_COMMUNITY): Payer: Self-pay | Admitting: *Deleted

## 2012-09-26 DIAGNOSIS — N946 Dysmenorrhea, unspecified: Secondary | ICD-10-CM | POA: Insufficient documentation

## 2012-09-26 DIAGNOSIS — R1031 Right lower quadrant pain: Secondary | ICD-10-CM | POA: Insufficient documentation

## 2012-09-26 DIAGNOSIS — R1032 Left lower quadrant pain: Secondary | ICD-10-CM | POA: Insufficient documentation

## 2012-09-26 LAB — URINALYSIS, ROUTINE W REFLEX MICROSCOPIC
Bilirubin Urine: NEGATIVE
Ketones, ur: NEGATIVE mg/dL
Leukocytes, UA: NEGATIVE
Nitrite: NEGATIVE
Protein, ur: NEGATIVE mg/dL

## 2012-09-26 MED ORDER — OXYCODONE-ACETAMINOPHEN 5-325 MG PO TABS
1.0000 | ORAL_TABLET | Freq: Once | ORAL | Status: AC
  Administered 2012-09-26: 1 via ORAL
  Filled 2012-09-26: qty 1

## 2012-09-26 MED ORDER — IBUPROFEN 600 MG PO TABS: 600.0000 mg | ORAL_TABLET | Freq: Four times a day (QID) | ORAL | Status: DC | PRN

## 2012-09-26 MED ORDER — OXYCODONE-ACETAMINOPHEN 5-325 MG PO TABS: 1.0000 | ORAL_TABLET | Freq: Four times a day (QID) | ORAL | Status: DC | PRN

## 2012-09-26 NOTE — MAU Note (Signed)
Period started yesterday morning. Having sharp pains in lower stomach -feels like someone is squeezing me. Moving makes sharp pains worse

## 2012-09-26 NOTE — Progress Notes (Signed)
Written and verbal d/c instructions given and understanding voiced. 

## 2012-09-26 NOTE — MAU Provider Note (Signed)
Chart reviewed and agree with management and plan.  

## 2012-09-26 NOTE — MAU Provider Note (Signed)
History     CSN: [id number]  Arrival date and time: 09/26/12 1056   First Provider Initiated Contact with Patient 09/26/12 1201      Chief Complaint  Patient presents with  . Abdominal Cramping   HPI Patient is a 27 y/o female who presents to the MAU with abdominal cramping with the onset of menses that began yesterday.  The patient went to the ED last month for similar symptoms but on that occasion, the bleeding was heavier.  Today, the pain is more intense with a 10/10 when she is experiencing cramps.  The pain is located inferior to the umbilicus, in both the RLQ and LLQ.  The patient states that her bleeding is normally less and the duration of menses 5-6 days.  Since her last delivery, Sept 6, 2013, her menstrual periods have lasted 8-9 days.  The patient has not tolerated pain well but has found some relief with ibuprofen and hot baths.  The patients last sexual intercourse was 2 weeks ago and she denies any recent STI's. The patient is not currently on birth control. Had planned for Depo provera after delivery and was unable to get it at Dr. Verdell Carmine office due to insurance issues. Plans to follow-up at [facility] as she is already a patient there.   The patient denies fever, chills, night sweats, N/V, SOB, chest pain, bowel/bladder dysfunction.  The patient denies paresthesia, diplopia, bleeding from the gums, rashes, or bruising.  OB History    Grav Para Term Preterm Abortions TAB SAB Ect Mult Living   6 4 4  0 2 2 0 0 0 4      Past Medical History  Diagnosis Date  . Anxiety   . PIH (pregnancy induced hypertension)   . Bronchitis   . Seasonal allergies   . Pregnancy induced hypertension   . Seizure     pseudoseizures; stress related  . Pyelonephritis     Past Surgical History  Procedure Date  . Abortions   . Dilation and curettage of uterus     ? retained POC     Family History  Problem Relation Age of Onset  . Hypertension Mother   . Diabetes Maternal Aunt   .  Hypertension Maternal Grandmother   . Diabetes Maternal Grandmother   . Heart disease Maternal Grandmother   . Hypertension Maternal Grandfather   . Heart disease Maternal Grandfather   . Anesthesia problems Neg Hx   . Hypotension Neg Hx   . Malignant hyperthermia Neg Hx   . Pseudochol deficiency Neg Hx   . Other Neg Hx   . Hearing loss Neg Hx     History  Substance Use Topics  . Smoking status: Current Some Day Smoker -- 7 years    Types: Cigarettes  . Smokeless tobacco: Never Used  . Alcohol Use: No    Allergies: No Known Allergies  Prescriptions prior to admission  Medication Sig Dispense Refill  . acetaminophen (TYLENOL) 500 MG tablet Take 500 mg by mouth every 6 (six) hours as needed. pain      . ibuprofen (ADVIL,MOTRIN) 600 MG tablet Take 1 tablet (600 mg total) by mouth every 6 (six) hours as needed for pain.  30 tablet  0  . albuterol (PROVENTIL HFA;VENTOLIN HFA) 108 (90 BASE) MCG/ACT inhaler Inhale 2 puffs into the lungs every 6 (six) hours as needed. For shortness of breath  1 Inhaler  2  . oxyCODONE-acetaminophen (PERCOCET/ROXICET) 5-325 MG per tablet Take 1-2 tablets by mouth every  6 (six) hours as needed for pain.  15 tablet  0    ROS All negative unless otherwise noted in HPI Physical Exam   Blood pressure 128/80, pulse 69, temperature 98 F (36.7 C), temperature source Oral, resp. rate 20, height 5\' 4"  (1.626 m), weight 166 lb 9.6 oz (75.569 kg), last menstrual period 09/25/2012.  Physical Exam General: Well-developed, well-nourished female in no acute distress. Appears uncomfortable.  Heart: RRR, no extra sounds, murmurs noted Lungs: clear and equal breath sounds bilaterally  ABD: soft, non distended, tenderness noted in the RLQ and LLQ.  No rebound tenderness or guarding PV: good distal pulses appreciated, no extremity edema Skin: no rashes noted  MAU Course  Procedures None   Assessment and Plan  A: Dysmenorrhea  P: Discharge home Rx for  Ibuprofen and percocet sent to the patient's pharmacy for pain Follow up at [facility] to start Depo Provera Patient may return to MAU as needed  Evalee Mutton 09/26/2012, 12:35 PM   I saw and evaluated this patient with the student. I have made additions to the note to reflect additional information. I agree with the assessment and plan.   Freddi Starr, PA-C 09/26/2012 3:28 PM

## 2013-01-24 ENCOUNTER — Encounter (HOSPITAL_COMMUNITY): Payer: Self-pay | Admitting: *Deleted

## 2013-01-24 ENCOUNTER — Emergency Department (HOSPITAL_COMMUNITY)
Admission: EM | Admit: 2013-01-24 | Discharge: 2013-01-24 | Disposition: A | Discharge status: Home / Self Care | Payer: Self-pay | Attending: Emergency Medicine | Admitting: Emergency Medicine

## 2013-01-24 DIAGNOSIS — N390 Urinary tract infection, site not specified: Secondary | ICD-10-CM | POA: Insufficient documentation

## 2013-01-24 DIAGNOSIS — F17200 Nicotine dependence, unspecified, uncomplicated: Secondary | ICD-10-CM | POA: Insufficient documentation

## 2013-01-24 DIAGNOSIS — Z8709 Personal history of other diseases of the respiratory system: Secondary | ICD-10-CM | POA: Insufficient documentation

## 2013-01-24 DIAGNOSIS — M549 Dorsalgia, unspecified: Secondary | ICD-10-CM | POA: Insufficient documentation

## 2013-01-24 DIAGNOSIS — Z8679 Personal history of other diseases of the circulatory system: Secondary | ICD-10-CM | POA: Insufficient documentation

## 2013-01-24 DIAGNOSIS — Z87448 Personal history of other diseases of urinary system: Secondary | ICD-10-CM | POA: Insufficient documentation

## 2013-01-24 DIAGNOSIS — Z8669 Personal history of other diseases of the nervous system and sense organs: Secondary | ICD-10-CM | POA: Insufficient documentation

## 2013-01-24 DIAGNOSIS — Z8659 Personal history of other mental and behavioral disorders: Secondary | ICD-10-CM | POA: Insufficient documentation

## 2013-01-24 DIAGNOSIS — Z79899 Other long term (current) drug therapy: Secondary | ICD-10-CM | POA: Insufficient documentation

## 2013-01-24 DIAGNOSIS — I10 Essential (primary) hypertension: Secondary | ICD-10-CM | POA: Insufficient documentation

## 2013-01-24 DIAGNOSIS — N946 Dysmenorrhea, unspecified: Secondary | ICD-10-CM | POA: Insufficient documentation

## 2013-01-24 DIAGNOSIS — N920 Excessive and frequent menstruation with regular cycle: Secondary | ICD-10-CM | POA: Insufficient documentation

## 2013-01-24 DIAGNOSIS — Z3202 Encounter for pregnancy test, result negative: Secondary | ICD-10-CM | POA: Insufficient documentation

## 2013-01-24 LAB — URINALYSIS, ROUTINE W REFLEX MICROSCOPIC
Glucose, UA: NEGATIVE mg/dL
Ketones, ur: 15 mg/dL — AB
Protein, ur: NEGATIVE mg/dL

## 2013-01-24 LAB — URINE MICROSCOPIC-ADD ON

## 2013-01-24 MED ORDER — NAPROXEN 500 MG PO TABS: 500.0000 mg | ORAL_TABLET | Freq: Two times a day (BID) | ORAL | Status: DC

## 2013-01-24 MED ORDER — HYDROCODONE-ACETAMINOPHEN 5-325 MG PO TABS: 1.0000 | ORAL_TABLET | Freq: Four times a day (QID) | ORAL | Status: DC | PRN

## 2013-01-24 MED ORDER — SULFAMETHOXAZOLE-TRIMETHOPRIM 800-160 MG PO TABS: 1.0000 | ORAL_TABLET | Freq: Two times a day (BID) | ORAL | Status: DC

## 2013-01-24 MED ORDER — KETOROLAC TROMETHAMINE 60 MG/2ML IM SOLN
60.0000 mg | Freq: Once | INTRAMUSCULAR | Status: AC
  Administered 2013-01-24: 60 mg via INTRAMUSCULAR
  Filled 2013-01-24: qty 2

## 2013-01-24 MED ORDER — HYDROCODONE-ACETAMINOPHEN 5-325 MG PO TABS
1.0000 | ORAL_TABLET | Freq: Once | ORAL | Status: AC
  Administered 2013-01-24: 1 via ORAL
  Filled 2013-01-24: qty 1

## 2013-01-24 NOTE — ED Notes (Addendum)
Pt in c/o lower abd pain and lower back pain, pt c/o vaginal bleeding that is abnormal for her, last had her period two weeks ago, denies any changes to vaginal discharge prior to bleeding, also c/o generalized weakness. Pt denies pain with urination, but states urine has been dark in color.

## 2013-01-24 NOTE — ED Provider Notes (Signed)
History     CSN: [id number]  Arrival date & time 01/24/13  1559   First MD Initiated Contact with Patient 01/24/13 1722      Chief Complaint  Patient presents with  . Abdominal Pain    (Consider location/radiation/quality/duration/timing/severity/associated sxs/prior treatment) The history is provided by the patient.  Kristi Marshall is a 27 y.o. female with a history of dysmenorrhea and menorrhagia presents emergency department complaining of lower abdominal cramping and back pain.  Patient reports that she had her regular menstrual cycle 2 weeks ago and again started her cycle 2 days ago.  Patient reports that her cramping and back pain is typical to pass cycles, however she is currently unable to bear the pain.  Patient has been seen and evaluated for this presentation by [facility] who have recommended the patient be started on birth control, however she was recently dropped from [facility] and has been unable to followup.  Patient denies any syncope, lightheadedness, shortness of breath or easy bruising.  Patient denies any dyspareunia, vaginal discharge or dysuria.  Past Medical History  Diagnosis Date  . Anxiety   . PIH (pregnancy induced hypertension)   . Bronchitis   . Seasonal allergies   . Pregnancy induced hypertension   . Seizure     pseudoseizures; stress related  . Pyelonephritis     Past Surgical History  Procedure Laterality Date  . Abortions    . Dilation and curettage of uterus      ? retained POC     Family History  Problem Relation Age of Onset  . Hypertension Mother   . Diabetes Maternal Aunt   . Hypertension Maternal Grandmother   . Diabetes Maternal Grandmother   . Heart disease Maternal Grandmother   . Hypertension Maternal Grandfather   . Heart disease Maternal Grandfather   . Anesthesia problems Neg Hx   . Hypotension Neg Hx   . Malignant hyperthermia Neg Hx   . Pseudochol deficiency Neg Hx   . Other Neg Hx   . Hearing loss Neg Hx      History  Substance Use Topics  . Smoking status: Current Some Day Smoker -- 7 years    Types: Cigarettes  . Smokeless tobacco: Never Used  . Alcohol Use: No    OB History   Grav Para Term Preterm Abortions TAB SAB Ect Mult Living   6 4 4  0 2 2 0 0 0 4      Review of Systems Ten systems reviewed and are negative for acute change, except as noted in the HPI.    Allergies  Review of patient's allergies indicates no known allergies.  Home Medications   Current Outpatient Rx  Name  Route  Sig  Dispense  Refill  . albuterol (PROVENTIL HFA;VENTOLIN HFA) 108 (90 BASE) MCG/ACT inhaler   Inhalation   Inhale 2 puffs into the lungs every 6 (six) hours as needed. For shortness of breath   1 Inhaler   2   . ibuprofen (ADVIL,MOTRIN) 600 MG tablet   Oral   Take 1 tablet (600 mg total) by mouth every 6 (six) hours as needed for pain.   30 tablet   0   . oxyCODONE-acetaminophen (PERCOCET/ROXICET) 5-325 MG per tablet   Oral   Take 1-2 tablets by mouth every 6 (six) hours as needed for pain.   15 tablet   0     BP 141/90  Pulse 68  Temp(Src) 98 F (36.7 C) (Oral)  Resp  20  SpO2 100%  Physical Exam  Nursing note and vitals reviewed. Constitutional: She is oriented to person, place, and time. She appears well-developed and well-nourished. No distress.  hypertensive  HENT:  Head: Normocephalic and atraumatic.  Eyes: Conjunctivae and EOM are normal.  Neck: Normal range of motion.  Cardiovascular: Normal rate and regular rhythm.   Pulmonary/Chest: Effort normal.  Abdominal:  Mild suprapubic ttp. No peritoneal signs.   Genitourinary:  No CVA tenderness. Pelvic exam deferred by patient  Musculoskeletal: Normal range of motion.  Neurological: She is alert and oriented to person, place, and time.  Skin: Skin is warm and dry. No rash noted. She is not diaphoretic.  Psychiatric: She has a normal mood and affect. Her behavior is normal.    ED Course  Procedures  (including critical care time)  Labs Reviewed  URINALYSIS, ROUTINE W REFLEX MICROSCOPIC - Abnormal; Notable for the following:    APPearance HAZY (*)    Hgb urine dipstick LARGE (*)    Bilirubin Urine SMALL (*)    Ketones, ur 15 (*)    Urobilinogen, UA >8.0 (*)    Nitrite POSITIVE (*)    Leukocytes, UA SMALL (*)    All other components within normal limits  URINE MICROSCOPIC-ADD ON - Abnormal; Notable for the following:    Squamous Epithelial / LPF FEW (*)    Bacteria, UA MANY (*)    Casts GRANULAR CAST (*)    All other components within normal limits  URINE CULTURE  POCT PREGNANCY, URINE   No results found.    No diagnosis found.    MDM  Menorrhagia and dysmenorrhea  27 year old female with similar presentation in past presents to ER complaining of menorrhagia and dysmenorrhea.  Labs reviewed with possible urinary tract infection.  Patient will be treated with antibiotic and pain medication. Pelvic exam deferred as pt states she has will see her OBGYN.  Importance of follow up discussed.  Strict return precautions discussed for worsening pain, development of fever, vaginal discharge or any other concern.  Patient verbalizes understanding.        Jaci Carrel, [place] 01/24/13 [phone]

## 2013-01-24 NOTE — ED Provider Notes (Signed)
Medical screening examination/treatment/procedure(s) were performed by non-physician practitioner and as supervising physician I was immediately available for consultation/collaboration.    Celene Kras, MD 01/24/13 Rickey Primus

## 2013-01-28 LAB — URINE CULTURE: Colony Count: >=100000

## 2013-01-29 ENCOUNTER — Telehealth (HOSPITAL_COMMUNITY): Payer: Self-pay | Admitting: Emergency Medicine

## 2013-01-29 NOTE — ED Notes (Signed)
Post ED Visit - Positive Culture Follow-up: Successful Patient Follow-Up  Culture assessed and recommendations reviewed by: []  Wes Dulaney, Pharm.D., BCPS []  Celedonio Miyamoto, Pharm.D., BCPS []  Georgina Pillion, Pharm.D., BCPS [x]  [place], [address].D., BCPS, AAHIVP []  Estella Husk, Pharm.D., BCPS, AAHIVP  Positive urine culture  []  Patient discharged without antimicrobial prescription and treatment is now indicated [x]  Organism is resistant to prescribed ED discharge antimicrobial []  Patient with positive blood cultures  Changes discussed with ED provider: Roxy Horseman New antibiotic prescription change to keflex or ceftin -Ceftin 250 mg po BID x 7 days       Larena Sox 01/29/2013, 5:44 PM

## 2013-01-29 NOTE — Progress Notes (Signed)
ED Antimicrobial Stewardship Positive Culture Follow Up   Kristi Marshall is an 27 y.o. female who presented to [facility] on 01/24/2013 with a chief complaint of abd pain. Chief Complaint  Patient presents with  . Abdominal Pain    Recent Results (from the past 720 hour(s))  URINE CULTURE     Status: None   Collection Time    01/24/13  5:21 PM      Result Value Range Status   Specimen Description URINE, CLEAN CATCH   Final   Special Requests NONE   Final   Culture  Setup Time 01/24/2013 19:02   Final   Colony Count >=100,000 COLONIES/ML   Final   Culture ESCHERICHIA COLI   Final   Report Status 01/28/2013 FINAL   Final   Organism ID, Bacteria ESCHERICHIA COLI   Final    [x]  Treated with septra, organism resistant to prescribed antimicrobial []  Patient discharged originally without antimicrobial agent and treatment is now indicated  New antibiotic prescription: Ceftin 150mg  bid x7days  ED Provider: Ivar Drape, PA   Ulyses [place]ward [place] 01/29/2013, 12:18 PM Infectious Diseases Pharmacist Phone# [phone]

## 2013-03-04 ENCOUNTER — Emergency Department (HOSPITAL_COMMUNITY)
Admission: EM | Admit: 2013-03-04 | Discharge: 2013-03-04 | Disposition: A | Discharge status: Home / Self Care | Payer: Self-pay | Attending: Emergency Medicine | Admitting: Emergency Medicine

## 2013-03-04 ENCOUNTER — Encounter (HOSPITAL_COMMUNITY): Payer: Self-pay | Admitting: *Deleted

## 2013-03-04 DIAGNOSIS — IMO0002 Reserved for concepts with insufficient information to code with codable children: Secondary | ICD-10-CM

## 2013-03-04 DIAGNOSIS — Z8669 Personal history of other diseases of the nervous system and sense organs: Secondary | ICD-10-CM | POA: Insufficient documentation

## 2013-03-04 DIAGNOSIS — S61209A Unspecified open wound of unspecified finger without damage to nail, initial encounter: Secondary | ICD-10-CM | POA: Insufficient documentation

## 2013-03-04 DIAGNOSIS — X789XXA Intentional self-harm by unspecified sharp object, initial encounter: Secondary | ICD-10-CM | POA: Insufficient documentation

## 2013-03-04 DIAGNOSIS — F17200 Nicotine dependence, unspecified, uncomplicated: Secondary | ICD-10-CM | POA: Insufficient documentation

## 2013-03-04 DIAGNOSIS — Z87448 Personal history of other diseases of urinary system: Secondary | ICD-10-CM | POA: Insufficient documentation

## 2013-03-04 DIAGNOSIS — Z8719 Personal history of other diseases of the digestive system: Secondary | ICD-10-CM | POA: Insufficient documentation

## 2013-03-04 DIAGNOSIS — Z8709 Personal history of other diseases of the respiratory system: Secondary | ICD-10-CM | POA: Insufficient documentation

## 2013-03-04 DIAGNOSIS — Z8659 Personal history of other mental and behavioral disorders: Secondary | ICD-10-CM | POA: Insufficient documentation

## 2013-03-04 DIAGNOSIS — Z7289 Other problems related to lifestyle: Secondary | ICD-10-CM

## 2013-03-04 LAB — CBC WITH DIFFERENTIAL/PLATELET
Eosinophils Relative: 2 % (ref 0–5)
HCT: 38 % (ref 36.0–46.0)
Lymphocytes Relative: 41 % (ref 12–46)
Lymphs Abs: 4 10*3/uL (ref 0.7–4.0)
MCV: 91.8 fL (ref 78.0–100.0)
Monocytes Absolute: 0.5 10*3/uL (ref 0.1–1.0)
Monocytes Relative: 5 % (ref 3–12)
RBC: 4.14 MIL/uL (ref 3.87–5.11)
RDW: 12.9 % (ref 11.5–15.5)
WBC: 9.9 10*3/uL (ref 4.0–10.5)

## 2013-03-04 LAB — RAPID URINE DRUG SCREEN, HOSP PERFORMED
Amphetamines: NOT DETECTED
Barbiturates: NOT DETECTED
Benzodiazepines: NOT DETECTED

## 2013-03-04 LAB — BASIC METABOLIC PANEL
Calcium: 8.9 mg/dL (ref 8.4–10.5)
GFR calc non Af Amer: >90 mL/min (ref >90)
Sodium: 140 mEq/L (ref 135–145)

## 2013-03-04 NOTE — ED Notes (Signed)
Lab did not receive bmp. Will redraw and send to lab
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
## 2013-03-04 NOTE — ED Provider Notes (Signed)
Medical screening examination/treatment/procedure(s) were performed by non-physician practitioner and as supervising physician I was immediately available for consultation/collaboration.  Toy Baker, MD 03/04/13 2350
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
## 2013-03-04 NOTE — ED Notes (Signed)
Bed:WA13<BR> Expected date:<BR> Expected time:<BR> Means of arrival:<BR> Comments:<BR>

## 2013-03-04 NOTE — ED Provider Notes (Signed)
Medical screening examination/treatment/procedure(s) were performed by non-physician practitioner and as supervising physician I was immediately available for consultation/collaboration.    Roselene Gray L Claudette Wermuth, MD 03/04/13 1403 
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
## 2013-03-05 ENCOUNTER — Emergency Department (HOSPITAL_COMMUNITY)
Admission: EM | Admit: 2013-03-05 | Discharge: 2013-03-05 | Disposition: A | Discharge status: Home / Self Care | Payer: Self-pay | Attending: Emergency Medicine | Admitting: Emergency Medicine

## 2013-03-05 DIAGNOSIS — F411 Generalized anxiety disorder: Secondary | ICD-10-CM | POA: Insufficient documentation

## 2013-03-05 DIAGNOSIS — Z3202 Encounter for pregnancy test, result negative: Secondary | ICD-10-CM | POA: Insufficient documentation

## 2013-03-05 DIAGNOSIS — F17200 Nicotine dependence, unspecified, uncomplicated: Secondary | ICD-10-CM | POA: Insufficient documentation

## 2013-03-05 DIAGNOSIS — J309 Allergic rhinitis, unspecified: Secondary | ICD-10-CM | POA: Insufficient documentation

## 2013-03-05 DIAGNOSIS — R569 Unspecified convulsions: Secondary | ICD-10-CM | POA: Insufficient documentation

## 2013-03-05 DIAGNOSIS — R109 Unspecified abdominal pain: Secondary | ICD-10-CM | POA: Insufficient documentation

## 2013-03-05 DIAGNOSIS — J40 Bronchitis, not specified as acute or chronic: Secondary | ICD-10-CM | POA: Insufficient documentation

## 2013-03-05 DIAGNOSIS — N390 Urinary tract infection, site not specified: Secondary | ICD-10-CM | POA: Insufficient documentation

## 2013-03-05 DIAGNOSIS — Z8742 Personal history of other diseases of the female genital tract: Secondary | ICD-10-CM | POA: Insufficient documentation

## 2013-03-05 DIAGNOSIS — Z79899 Other long term (current) drug therapy: Secondary | ICD-10-CM | POA: Insufficient documentation

## 2013-03-05 LAB — URINE MICROSCOPIC-ADD ON

## 2013-03-05 LAB — CBC WITH DIFFERENTIAL/PLATELET
Basophils Absolute: 0 10*3/uL (ref 0.0–0.1)
Eosinophils Absolute: 0.1 10*3/uL (ref 0.0–0.7)
Lymphocytes Relative: 16 % (ref 12–46)
Lymphs Abs: 1.5 10*3/uL (ref 0.7–4.0)
MCH: 31.6 pg (ref 26.0–34.0)
Neutrophils Relative %: 75 % (ref 43–77)
Platelets: 220 10*3/uL (ref 150–400)
RBC: 4.05 MIL/uL (ref 3.87–5.11)
WBC: 9.5 10*3/uL (ref 4.0–10.5)

## 2013-03-05 LAB — COMPREHENSIVE METABOLIC PANEL
ALT: 9 U/L (ref 0–35)
AST: 13 U/L (ref 0–37)
Alkaline Phosphatase: 88 U/L (ref 39–117)
GFR calc Af Amer: >90 mL/min (ref >90)
Glucose, Bld: 110 mg/dL — ABNORMAL HIGH (ref 70–99)
Potassium: 3.3 mEq/L — ABNORMAL LOW (ref 3.5–5.1)
Sodium: 137 mEq/L (ref 135–145)
Total Protein: 6.7 g/dL (ref 6.0–8.3)

## 2013-03-05 LAB — URINALYSIS, ROUTINE W REFLEX MICROSCOPIC
Bilirubin Urine: NEGATIVE
Glucose, UA: NEGATIVE mg/dL
Specific Gravity, Urine: 1.016 (ref 1.005–1.030)
Urobilinogen, UA: 0.2 mg/dL (ref 0.0–1.0)
pH: 5.5 (ref 5.0–8.0)

## 2013-03-05 MED ORDER — KETOROLAC TROMETHAMINE 30 MG/ML IJ SOLN
30.0000 mg | Freq: Once | INTRAMUSCULAR | Status: AC
  Administered 2013-03-05: 30 mg via INTRAVENOUS
  Filled 2013-03-05: qty 1

## 2013-03-05 MED ORDER — PHENAZOPYRIDINE HCL 100 MG PO TABS
95.0000 mg | ORAL_TABLET | Freq: Once | ORAL | Status: AC
  Administered 2013-03-05: 100 mg via ORAL
  Filled 2013-03-05: qty 1

## 2013-03-05 MED ORDER — SULFAMETHOXAZOLE-TMP DS 800-160 MG PO TABS
1.0000 | ORAL_TABLET | Freq: Once | ORAL | Status: AC
  Administered 2013-03-05: 1 via ORAL
  Filled 2013-03-05: qty 1

## 2013-03-05 MED ORDER — ONDANSETRON HCL 4 MG/2ML IJ SOLN
4.0000 mg | Freq: Once | INTRAMUSCULAR | Status: AC
  Administered 2013-03-05: 4 mg via INTRAVENOUS
  Filled 2013-03-05: qty 2

## 2013-03-05 MED ORDER — SODIUM CHLORIDE 0.9 % IV SOLN
1000.0000 mL | Freq: Once | INTRAVENOUS | Status: AC
  Administered 2013-03-05: 1000 mL via INTRAVENOUS

## 2013-03-05 MED ORDER — PHENAZOPYRIDINE HCL 95 MG PO TABS: 95.0000 mg | ORAL_TABLET | Freq: Three times a day (TID) | ORAL | Status: DC | PRN

## 2013-03-05 MED ORDER — SULFAMETHOXAZOLE-TMP DS 800-160 MG PO TABS: 1.0000 | ORAL_TABLET | Freq: Two times a day (BID) | ORAL | Status: DC

## 2013-03-05 NOTE — ED Notes (Signed)
Pt stated sharp long pain, woken up from sleep with the pain, started from lower pelvic and radiating now to lower back. Hx of kidney infection November 2013, stated " this is like what happened in November when I had my kidney infection"

## 2013-03-05 NOTE — ED Notes (Signed)
Per ems- pt reports abdominal, bilateral side and lower back pain for 5 days. Urine is darker than normal and states all these sx she had before when she had a kidney infx. pain 10/10.

## 2013-03-05 NOTE — ED Provider Notes (Signed)
History     CSN: [id number]  Arrival date & time 03/05/13  0209   First MD Initiated Contact with Patient 03/05/13 0234      Chief Complaint  Patient presents with  . Abdominal Pain  . Back Pain    (Consider location/radiation/quality/duration/timing/severity/associated sxs/prior treatment) HPI Patient presents with concern of one day of abdominal pain.  The pain is suprapubic, or radiating to both flanks.  The pain is sore.  The pain is moderate, with no attempts at relief thus far.  No clear exacerbating factors. Patient does have chills, but denies subjective fever, diarrhea, vomiting, dysuria or hematuria.    Past Medical History  Diagnosis Date  . Anxiety   . PIH (pregnancy induced hypertension)   . Bronchitis   . Seasonal allergies   . Pregnancy induced hypertension   . Seizure     pseudoseizures; stress related  . Pyelonephritis     Past Surgical History  Procedure Laterality Date  . Abortions    . Dilation and curettage of uterus      ? retained POC     Family History  Problem Relation Age of Onset  . Hypertension Mother   . Diabetes Maternal Aunt   . Hypertension Maternal Grandmother   . Diabetes Maternal Grandmother   . Heart disease Maternal Grandmother   . Hypertension Maternal Grandfather   . Heart disease Maternal Grandfather   . Anesthesia problems Neg Hx   . Hypotension Neg Hx   . Malignant hyperthermia Neg Hx   . Pseudochol deficiency Neg Hx   . Other Neg Hx   . Hearing loss Neg Hx     History  Substance Use Topics  . Smoking status: Current Some Day Smoker -- 7 years    Types: Cigarettes  . Smokeless tobacco: Never Used  . Alcohol Use: No    OB History   Grav Para Term Preterm Abortions TAB SAB Ect Mult Living   6 4 4  0 2 2 0 0 0 4      Review of Systems  Constitutional:       Per HPI, otherwise negative  HENT:       Per HPI, otherwise negative  Respiratory:       Per HPI, otherwise negative  Cardiovascular:       Per  HPI, otherwise negative  Gastrointestinal: Negative for vomiting.  Endocrine:       Negative aside from HPI  Genitourinary:       Neg aside from HPI   Musculoskeletal:       Per HPI, otherwise negative  Skin: Negative.   Neurological: Negative for syncope.    Allergies  Review of patient's allergies indicates no known allergies.  Home Medications   Current Outpatient Rx  Name  Route  Sig  Dispense  Refill  . albuterol (PROVENTIL HFA;VENTOLIN HFA) 108 (90 BASE) MCG/ACT inhaler   Inhalation   Inhale 2 puffs into the lungs every 6 (six) hours as needed. For shortness of breath   1 Inhaler   2   . HYDROcodone-acetaminophen (NORCO/VICODIN) 5-325 MG per tablet   Oral   Take 1 tablet by mouth every 6 (six) hours as needed for pain.   15 tablet   0   . ibuprofen (ADVIL,MOTRIN) 200 MG tablet   Oral   Take 200 mg by mouth every 6 (six) hours as needed for pain (pain).           LMP 02/21/2013  Physical Exam  Nursing note and vitals reviewed. Constitutional: She is oriented to person, place, and time. She appears well-developed and well-nourished. No distress.  HENT:  Head: Normocephalic and atraumatic.  Eyes: Conjunctivae and EOM are normal.  Cardiovascular: Normal rate and regular rhythm.   Pulmonary/Chest: Effort normal and breath sounds normal. No stridor. No respiratory distress.  Abdominal: She exhibits no distension. There is no hepatosplenomegaly or hepatomegaly. There is tenderness in the suprapubic area. There is no rebound and no CVA tenderness.  Musculoskeletal: She exhibits no edema.  Neurological: She is alert and oriented to person, place, and time. No cranial nerve deficit.  Skin: Skin is warm and dry.  Psychiatric: She has a normal mood and affect.    ED Course  Procedures (including critical care time)  Labs Reviewed  CBC WITH DIFFERENTIAL  COMPREHENSIVE METABOLIC PANEL  LIPASE, BLOOD  URINALYSIS, ROUTINE W REFLEX MICROSCOPIC  PREGNANCY, URINE    No results found.   No diagnosis found.  5:50 AM Patient seems comfortable  MDM  This young female presents with new suprapubic pain. On exam she is awake and alert, in no distress, hemodynamically stable.  Patient's urine is consistent with cystitis versus early urinary tract infection.  Patient received antibiotics here, analgesics here, and absent evidence of distress, she was appropriate for discharge. Though there is no hematuria, the denial of any significant flank pain, the denial of CVA tenderness to palpation, or any history of kidney stone next this is less likely etiology.  Regardless, there is no evidence of distress or suspicion for occult infected kidney stone        Gerhard Munch, MD 03/05/13 [phone]

## 2013-03-05 NOTE — ED Notes (Signed)
Pt has a sutured incision on her left inner arm, stated accident from yesterday.

## 2013-03-06 LAB — URINE CULTURE

## 2013-03-07 ENCOUNTER — Telehealth (HOSPITAL_COMMUNITY): Payer: Self-pay | Admitting: Emergency Medicine

## 2013-03-07 ENCOUNTER — Telehealth (HOSPITAL_COMMUNITY): Payer: Self-pay | Admitting: *Deleted

## 2013-03-07 NOTE — Progress Notes (Signed)
ED Antimicrobial Stewardship Positive Culture Follow Up   Kristi Marshall is an 27 y.o. female who presented to [facility] on 03/05/2013 with a chief complaint of  Chief Complaint  Patient presents with  . Abdominal Pain  . Back Pain    Recent Results (from the past 720 hour(s))  URINE CULTURE     Status: None   Collection Time    03/05/13  3:16 AM      Result Value Range Status   Specimen Description URINE, CLEAN CATCH   Final   Special Requests ADDED [phone]   Final   Culture  Setup Time 03/05/2013 04:12   Final   Colony Count >=100,000 COLONIES/ML   Final   Culture ESCHERICHIA COLI   Final   Report Status 03/06/2013 FINAL   Final   Organism ID, Bacteria ESCHERICHIA COLI   Final    [x]  Treated with Bactrim, organism resistant to prescribed antimicrobial []  Patient discharged originally without antimicrobial agent and treatment is now indicated  New antibiotic prescription: Keflex 500mg  QID x 7 days and stop taking Bactrim  ED Provider: Glade Nurse, PA-C   Mickeal Skinner 03/07/2013, 2:12 PM Infectious Diseases Pharmacist Phone# [phone]

## 2013-03-07 NOTE — ED Notes (Signed)
Post ED Visit - Positive Culture Follow-up: Successful Patient Follow-Up  Culture assessed and recommendations reviewed by: []  Wes Dulaney, Pharm.D., BCPS [x]  Celedonio Miyamoto, Pharm.D., BCPS []  Georgina Pillion, Pharm.D., BCPS []  [place], [address].D., BCPS, AAHIVP []  Estella Husk, Pharm.D., BCPS, AAHIVP  Positive urine culture  [x]  Patient discharged without antimicrobial prescription and treatment is now indicated []  Organism is resistant to prescribed ED discharge antimicrobial []  Patient with positive blood cultures  Changes discussed with ED provider: Glade Nurse New antibiotic prescription -Stop taking Bactrim- change to Keflex 500 mg QID x 7 days      Larena Sox 03/07/2013, 12:30 PM

## 2013-05-08 ENCOUNTER — Inpatient Hospital Stay (HOSPITAL_COMMUNITY): Payer: Self-pay

## 2013-05-08 ENCOUNTER — Inpatient Hospital Stay (HOSPITAL_COMMUNITY)
Admission: AD | Admit: 2013-05-08 | Discharge: 2013-05-08 | Disposition: A | Discharge status: Home / Self Care | Payer: Self-pay | Source: Ambulatory Visit | Attending: Obstetrics and Gynecology | Admitting: Obstetrics and Gynecology

## 2013-05-08 ENCOUNTER — Encounter (HOSPITAL_COMMUNITY): Payer: Self-pay | Admitting: *Deleted

## 2013-05-08 DIAGNOSIS — R109 Unspecified abdominal pain: Secondary | ICD-10-CM | POA: Insufficient documentation

## 2013-05-08 DIAGNOSIS — O2390 Unspecified genitourinary tract infection in pregnancy, unspecified trimester: Secondary | ICD-10-CM | POA: Insufficient documentation

## 2013-05-08 DIAGNOSIS — A599 Trichomoniasis, unspecified: Secondary | ICD-10-CM

## 2013-05-08 DIAGNOSIS — O209 Hemorrhage in early pregnancy, unspecified: Secondary | ICD-10-CM | POA: Insufficient documentation

## 2013-05-08 DIAGNOSIS — O26899 Other specified pregnancy related conditions, unspecified trimester: Secondary | ICD-10-CM

## 2013-05-08 DIAGNOSIS — N390 Urinary tract infection, site not specified: Secondary | ICD-10-CM | POA: Insufficient documentation

## 2013-05-08 DIAGNOSIS — O98819 Other maternal infectious and parasitic diseases complicating pregnancy, unspecified trimester: Secondary | ICD-10-CM | POA: Insufficient documentation

## 2013-05-08 DIAGNOSIS — A5901 Trichomonal vulvovaginitis: Secondary | ICD-10-CM | POA: Insufficient documentation

## 2013-05-08 LAB — CBC
Hemoglobin: 11.8 g/dL — ABNORMAL LOW (ref 12.0–15.0)
MCH: 31.6 pg (ref 26.0–34.0)
MCHC: 33.9 g/dL (ref 30.0–36.0)
MCV: 93.3 fL (ref 78.0–100.0)
RBC: 3.73 MIL/uL — ABNORMAL LOW (ref 3.87–5.11)

## 2013-05-08 LAB — URINALYSIS, ROUTINE W REFLEX MICROSCOPIC
Bilirubin Urine: NEGATIVE
Nitrite: POSITIVE — AB
Specific Gravity, Urine: 1.02 (ref 1.005–1.030)
Urobilinogen, UA: 1 mg/dL (ref 0.0–1.0)
pH: 6.5 (ref 5.0–8.0)

## 2013-05-08 LAB — URINE MICROSCOPIC-ADD ON

## 2013-05-08 LAB — GC/CHLAMYDIA PROBE AMP
CT Probe RNA: NEGATIVE
GC Probe RNA: NEGATIVE

## 2013-05-08 LAB — WET PREP, GENITAL
Clue Cells Wet Prep HPF POC: NONE SEEN
Yeast Wet Prep HPF POC: NONE SEEN

## 2013-05-08 MED ORDER — METRONIDAZOLE 500 MG PO TABS
2000.0000 mg | ORAL_TABLET | Freq: Once | ORAL | Status: AC
  Administered 2013-05-08: 2000 mg via ORAL
  Filled 2013-05-08: qty 4

## 2013-05-08 MED ORDER — CEPHALEXIN 500 MG PO CAPS: 500.0000 mg | ORAL_CAPSULE | Freq: Four times a day (QID) | ORAL | Status: DC

## 2013-05-08 MED ORDER — OXYCODONE-ACETAMINOPHEN 5-325 MG PO TABS
2.0000 | ORAL_TABLET | Freq: Once | ORAL | Status: AC
  Administered 2013-05-08: 2 via ORAL
  Filled 2013-05-08: qty 2

## 2013-05-08 NOTE — MAU Note (Signed)
Patient states she had a positive pregnancy test last week. States this am she had light pink bleeding, enough to cover middle of pad, and bad abdominal cramping. Patient is not wearing a pad at this time with no active bleeding. Denies nausea or vomiting.

## 2013-05-08 NOTE — MAU Provider Note (Signed)
History     CSN: [id number]  Arrival date and time: 05/08/13 1016   First Provider Initiated Contact with Patient 05/08/13 1044      Chief Complaint  Patient presents with  . Possible Pregnancy  . Abdominal Pain  . Vaginal Bleeding   HPI Ms. Kristi Marshall is a 27 y.o. [phone] at [redacted]w[redacted]d who presents to MAU today with complaint of vaginal bleeding and lower abdominal pain. +HPT last week. Lower abdominal cramping started 3 days ago and radiates to her back. Rated at 8/10 now. Endorses dizziness. Bright red bleeding similar to a period this morning. Denies other vaginal discharge, N/V/D, fever, UTI symptoms or chills. She has not tried any medications for the pain.   OB History   Grav Para Term Preterm Abortions TAB SAB Ect Mult Living   7 4 4  0 2 2 0 0 0 4      Past Medical History  Diagnosis Date  . Anxiety   . PIH (pregnancy induced hypertension)   . Bronchitis   . Seasonal allergies   . Pregnancy induced hypertension   . Seizure     pseudoseizures; stress related  . Pyelonephritis     Past Surgical History  Procedure Laterality Date  . Abortions    . Dilation and curettage of uterus      ? retained POC     Family History  Problem Relation Age of Onset  . Hypertension Mother   . Diabetes Maternal Aunt   . Hypertension Maternal Grandmother   . Diabetes Maternal Grandmother   . Heart disease Maternal Grandmother   . Hypertension Maternal Grandfather   . Heart disease Maternal Grandfather   . Anesthesia problems Neg Hx   . Hypotension Neg Hx   . Malignant hyperthermia Neg Hx   . Pseudochol deficiency Neg Hx   . Other Neg Hx   . Hearing loss Neg Hx     History  Substance Use Topics  . Smoking status: Current Some Day Smoker -- 7 years    Types: Cigarettes  . Smokeless tobacco: Never Used  . Alcohol Use: No    Allergies: No Known Allergies  Prescriptions prior to admission  Medication Sig Dispense Refill  . albuterol (PROVENTIL HFA;VENTOLIN HFA)  108 (90 BASE) MCG/ACT inhaler Inhale 2 puffs into the lungs every 6 (six) hours as needed. For shortness of breath  1 Inhaler  2    Review of Systems  Constitutional: Negative for fever and malaise/fatigue.  Gastrointestinal: Positive for abdominal pain. Negative for nausea, vomiting, diarrhea and constipation.  Genitourinary: Negative for dysuria, urgency and frequency.       Neg - vaginal discharge + vaginal bleeding  Neurological: Positive for dizziness. Negative for loss of consciousness and weakness.   Physical Exam   Blood pressure 124/71, pulse 72, temperature 99.2 F (37.3 C), temperature source Oral, resp. rate 16, height 5' 4.5" (1.638 m), weight 154 lb 6.4 oz (70.035 kg), last menstrual period 03/22/2013, SpO2 100.00%.  Physical Exam  Constitutional: She is oriented to person, place, and time. She appears well-developed and well-nourished. No distress.  HENT:  Head: Normocephalic and atraumatic.  Cardiovascular: Normal rate, regular rhythm and normal heart sounds.   Respiratory: Effort normal and breath sounds normal. No respiratory distress.  GI: Soft. Bowel sounds are normal. She exhibits no distension and no mass. There is no tenderness. There is no rebound and no guarding.  Genitourinary: Uterus is tender (mild tenderness to palpation on bimanual exam). Uterus is  not enlarged. Cervix exhibits no motion tenderness, no discharge and no friability. Right adnexum displays no mass and no tenderness. Left adnexum displays no mass and no tenderness. There is bleeding (scant blood) around the vagina. Vaginal discharge (small amount of thin, pink discharge noted) found.  Neurological: She is alert and oriented to person, place, and time.  Skin: Skin is warm and dry. No erythema.  Psychiatric: She has a normal mood and affect.    Results for orders placed during the hospital encounter of 05/08/13 (from the past 24 hour(s))  POCT PREGNANCY, URINE     Status: Abnormal   Collection  Time    05/08/13 10:46 AM      Result Value Range   Preg Test, Ur POSITIVE (*) NEGATIVE  URINALYSIS, ROUTINE W REFLEX MICROSCOPIC     Status: Abnormal   Collection Time    05/08/13 10:48 AM      Result Value Range   Color, Urine YELLOW  YELLOW   APPearance CLEAR  CLEAR   Specific Gravity, Urine 1.020  1.005 - 1.030   pH 6.5  5.0 - 8.0   Glucose, UA NEGATIVE  NEGATIVE mg/dL   Hgb urine dipstick TRACE (*) NEGATIVE   Bilirubin Urine NEGATIVE  NEGATIVE   Ketones, ur NEGATIVE  NEGATIVE mg/dL   Protein, ur NEGATIVE  NEGATIVE mg/dL   Urobilinogen, UA 1.0  0.0 - 1.0 mg/dL   Nitrite POSITIVE (*) NEGATIVE   Leukocytes, UA NEGATIVE  NEGATIVE  URINE MICROSCOPIC-ADD ON     Status: Abnormal   Collection Time    05/08/13 10:48 AM      Result Value Range   Squamous Epithelial / LPF FEW (*) RARE   WBC, UA 3-6  <3 WBC/hpf   RBC / HPF 0-2  <3 RBC/hpf   Bacteria, UA MANY (*) RARE  WET PREP, GENITAL     Status: Abnormal   Collection Time    05/08/13 11:05 AM      Result Value Range   Yeast Wet Prep HPF POC NONE SEEN  NONE SEEN   Trich, Wet Prep FEW (*) NONE SEEN   Clue Cells Wet Prep HPF POC NONE SEEN  NONE SEEN   WBC, Wet Prep HPF POC MODERATE (*) NONE SEEN  CBC     Status: Abnormal   Collection Time    05/08/13 11:22 AM      Result Value Range   WBC 4.7  4.0 - 10.5 K/uL   RBC 3.73 (*) 3.87 - 5.11 MIL/uL   Hemoglobin 11.8 (*) 12.0 - 15.0 g/dL   HCT 40.9 (*) 81.1 - 91.4 %   MCV 93.3  78.0 - 100.0 fL   MCH 31.6  26.0 - 34.0 pg   MCHC 33.9  30.0 - 36.0 g/dL   RDW 78.2  95.6 - 21.3 %   Platelets 251  150 - 400 K/uL  HCG, QUANTITATIVE, PREGNANCY     Status: Abnormal   Collection Time    05/08/13 11:22 AM      Result Value Range   hCG, Beta Chain, Quant, S 8608 (*) <5 mIU/mL     MAU Course  Procedures None  MDM +UPT AB+ blood type in Epic UA, Wet prep, GC/Chlamydia, CBC, quant hCG and [place] today  Assessment and Plan  A: IUGS and YS at [redacted]w[redacted]d Trichomonas UTI  P: Discharge  home Patient advised to take Tylenol PRN for pain Rx for Keflex sent to patient's pharmacy Patient treated with Flagyl in MAU  today and advised of partner treatment First trimester warning signs discussed Patient advised to call [facility] for an appointment to start prenatal care Patient may return to MAU as needed or if her condition were to change or worsen  Freddi Starr, PA-C 05/08/2013, 1:30 PM

## 2013-05-09 LAB — URINE CULTURE: Colony Count: 25000

## 2013-05-12 NOTE — MAU Provider Note (Signed)
Attestation of Attending Supervision of Advanced Practitioner (CNM/NP): Evaluation and management procedures were performed by the Advanced Practitioner under my supervision and collaboration.  I have reviewed the Advanced Practitioner's note and chart, and I agree with the management and plan.  Alaster Asfaw 05/12/2013 1:42 PM

## 2013-05-18 ENCOUNTER — Encounter (HOSPITAL_COMMUNITY): Payer: Self-pay | Admitting: Emergency Medicine

## 2013-05-18 ENCOUNTER — Emergency Department (HOSPITAL_COMMUNITY)
Admission: EM | Admit: 2013-05-18 | Discharge: 2013-05-18 | Disposition: A | Discharge status: Home / Self Care | Payer: Self-pay | Attending: Emergency Medicine | Admitting: Emergency Medicine

## 2013-05-18 DIAGNOSIS — Z8679 Personal history of other diseases of the circulatory system: Secondary | ICD-10-CM | POA: Insufficient documentation

## 2013-05-18 DIAGNOSIS — Z87448 Personal history of other diseases of urinary system: Secondary | ICD-10-CM | POA: Insufficient documentation

## 2013-05-18 DIAGNOSIS — Z8709 Personal history of other diseases of the respiratory system: Secondary | ICD-10-CM | POA: Insufficient documentation

## 2013-05-18 DIAGNOSIS — Z8659 Personal history of other mental and behavioral disorders: Secondary | ICD-10-CM | POA: Insufficient documentation

## 2013-05-18 DIAGNOSIS — J029 Acute pharyngitis, unspecified: Secondary | ICD-10-CM | POA: Insufficient documentation

## 2013-05-18 DIAGNOSIS — Z79899 Other long term (current) drug therapy: Secondary | ICD-10-CM | POA: Insufficient documentation

## 2013-05-18 DIAGNOSIS — F17200 Nicotine dependence, unspecified, uncomplicated: Secondary | ICD-10-CM | POA: Insufficient documentation

## 2013-05-18 DIAGNOSIS — R6883 Chills (without fever): Secondary | ICD-10-CM | POA: Insufficient documentation

## 2013-05-18 DIAGNOSIS — R599 Enlarged lymph nodes, unspecified: Secondary | ICD-10-CM | POA: Insufficient documentation

## 2013-05-18 DIAGNOSIS — IMO0001 Reserved for inherently not codable concepts without codable children: Secondary | ICD-10-CM | POA: Insufficient documentation

## 2013-05-18 DIAGNOSIS — Z8669 Personal history of other diseases of the nervous system and sense organs: Secondary | ICD-10-CM | POA: Insufficient documentation

## 2013-05-18 MED ORDER — HYDROCODONE-ACETAMINOPHEN 7.5-325 MG/15ML PO SOLN: 10.0000 mL | Freq: Four times a day (QID) | ORAL | Status: DC | PRN

## 2013-05-18 NOTE — ED Provider Notes (Signed)
CSN: [id number]     Arrival date & time 05/18/13  1104 History   First MD Initiated Contact with Patient 05/18/13 1144     Chief Complaint  Patient presents with  . Sore Throat   (Consider location/radiation/quality/duration/timing/severity/associated sxs/prior Treatment) Patient is a 27 y.o. female presenting with pharyngitis. The history is provided by the patient. No language interpreter was used.  Sore Throat This is a new problem. The current episode started in the past 7 days. Associated symptoms include chills, myalgias and a sore throat. Pertinent negatives include no abdominal pain, congestion, coughing, fever, nausea or rash. Associated symptoms comments: Sore throat for the past 3 days without known fever, vomiting, congestion, cough..    Past Medical History  Diagnosis Date  . Anxiety   . PIH (pregnancy induced hypertension)   . Bronchitis   . Seasonal allergies   . Pregnancy induced hypertension   . Seizure     pseudoseizures; stress related  . Pyelonephritis    Past Surgical History  Procedure Laterality Date  . Abortions    . Dilation and curettage of uterus      ? retained POC    Family History  Problem Relation Age of Onset  . Hypertension Mother   . Diabetes Maternal Aunt   . Hypertension Maternal Grandmother   . Diabetes Maternal Grandmother   . Heart disease Maternal Grandmother   . Hypertension Maternal Grandfather   . Heart disease Maternal Grandfather   . Anesthesia problems Neg Hx   . Hypotension Neg Hx   . Malignant hyperthermia Neg Hx   . Pseudochol deficiency Neg Hx   . Other Neg Hx   . Hearing loss Neg Hx    History  Substance Use Topics  . Smoking status: Current Some Day Smoker -- 7 years    Types: Cigarettes  . Smokeless tobacco: Never Used  . Alcohol Use: No   OB History   Grav Para Term Preterm Abortions TAB SAB Ect Mult Living   7 4 4  0 2 2 0 0 0 4     Review of Systems  Constitutional: Positive for chills. Negative for  fever.  HENT: Positive for sore throat. Negative for congestion, trouble swallowing and sinus pressure.   Respiratory: Negative for cough and shortness of breath.   Gastrointestinal: Negative for nausea and abdominal pain.  Genitourinary: Negative for dysuria.  Musculoskeletal: Positive for myalgias.  Skin: Negative for rash.    Allergies  Bactrim  Home Medications   Current Outpatient Rx  Name  Route  Sig  Dispense  Refill  . albuterol (PROVENTIL HFA;VENTOLIN HFA) 108 (90 BASE) MCG/ACT inhaler   Inhalation   Inhale 2 puffs into the lungs every 6 (six) hours as needed. For shortness of breath   1 Inhaler   2    BP 133/76  Pulse 89  Temp(Src) 98.3 F (36.8 C) (Oral)  Resp 18  Ht 5\' 4"  (1.626 m)  Wt 149 lb 3.2 oz (67.677 kg)  BMI 25.6 kg/m2  SpO2 95%  LMP 03/22/2013 Physical Exam  Constitutional: She is oriented to person, place, and time. She appears well-developed and well-nourished.  HENT:  Head: Normocephalic.  Nose: Mucosal edema present.  Mouth/Throat: Uvula is midline and mucous membranes are normal. Mucous membranes are not dry. Posterior oropharyngeal erythema present. No oropharyngeal exudate, posterior oropharyngeal edema or tonsillar abscesses.  Neck: Normal range of motion. Neck supple.  Cardiovascular: Normal rate and regular rhythm.   Pulmonary/Chest: Effort normal and breath sounds  normal. No stridor.  Abdominal: Soft. Bowel sounds are normal. There is no tenderness. There is no rebound and no guarding.  Musculoskeletal: Normal range of motion.  Lymphadenopathy:    She has cervical adenopathy.  Neurological: She is alert and oriented to person, place, and time. No cranial nerve deficit.  Skin: Skin is warm and dry. No rash noted.  Psychiatric: She has a normal mood and affect.    ED Course  Procedures (including critical care time) Labs Review Labs Reviewed  RAPID STREP SCREEN  CULTURE, GROUP A STREP   Results for orders placed during the  hospital encounter of 05/18/13  RAPID STREP SCREEN      Result Value Range   Streptococcus, Group A Screen (Direct) NEGATIVE  NEGATIVE    Imaging Review No results found.  MDM  No diagnosis found. 1. Pharyngitis  Uncomplicated viral appearing sore throat with negative strep test. Supportive care.     Arnoldo Hooker, PA-C 05/18/13 1231

## 2013-05-18 NOTE — ED Provider Notes (Signed)
Medical screening examination/treatment/procedure(s) were performed by non-physician practitioner and as supervising physician I was immediately available for consultation/collaboration.   William Harmonii Karle, MD 05/18/13 1536 

## 2013-05-18 NOTE — ED Notes (Signed)
Pt c/o sore throat x 3 days

## 2013-05-18 NOTE — ED Notes (Signed)
Pt. Given a sprite ,tolerating without difficulty

## 2013-05-21 LAB — CULTURE, GROUP A STREP

## 2013-08-04 IMAGING — US US OB COMP LESS 14 WK
1 series · 14 of 25 positions shown · non-contrast
Comparison: None.

CLINICAL DATA: Vaginal bleeding with a positive pregnancy test.
Cramping.

OBSTETRIC <14 WK US AND TRANSVAGINAL OB US
TECHNIQUE: Both transabdominal and transvaginal ultrasound
examinations were performed for complete evaluation of the
gestation as well as the maternal uterus, adnexal regions, and
pelvic cul-de-sac.  Transvaginal technique was performed to assess
early pregnancy.

[Series 1: us ob comp less 14 wks · 14 of 25 slices shown]
[im 1/25]
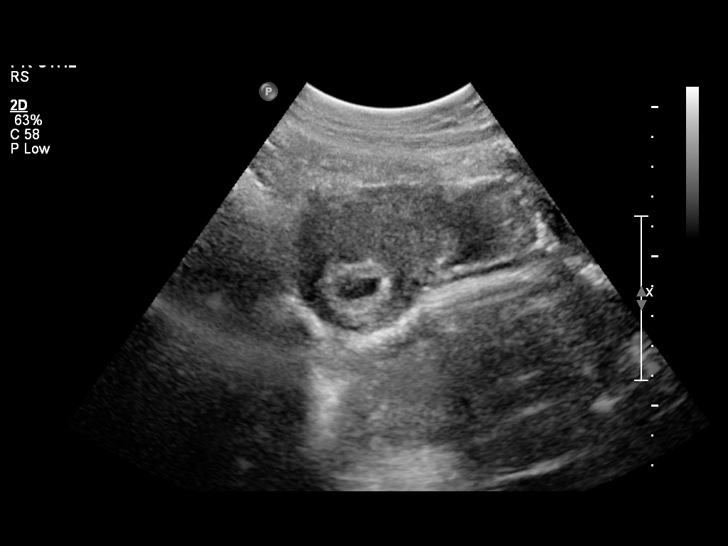
[im 3/25]
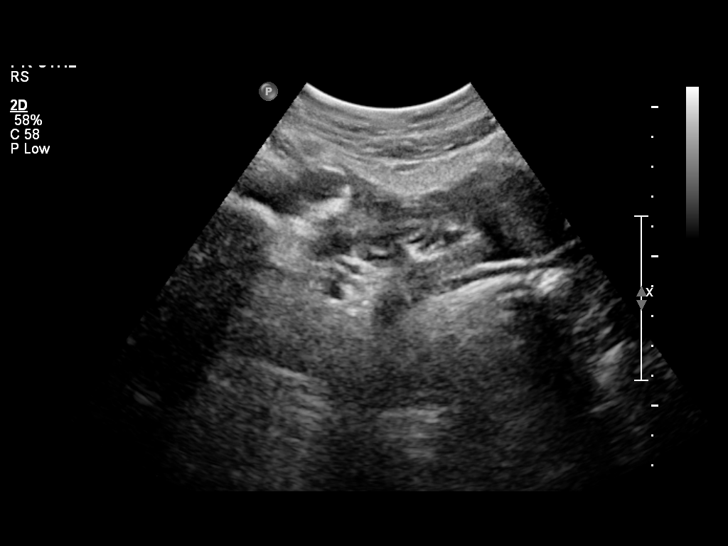
[im 5/25]
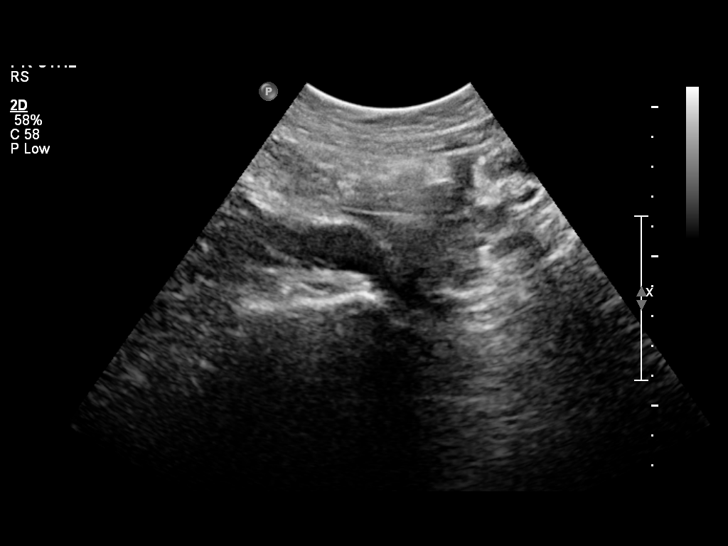
[im 7/25]
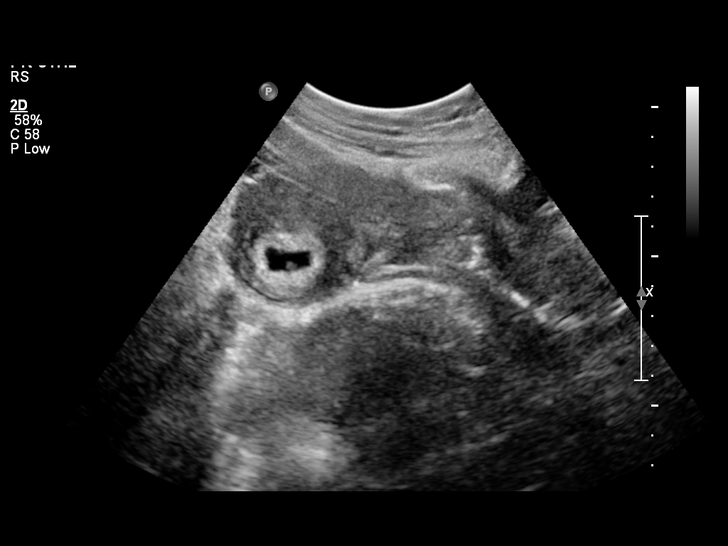
[im 9/25]
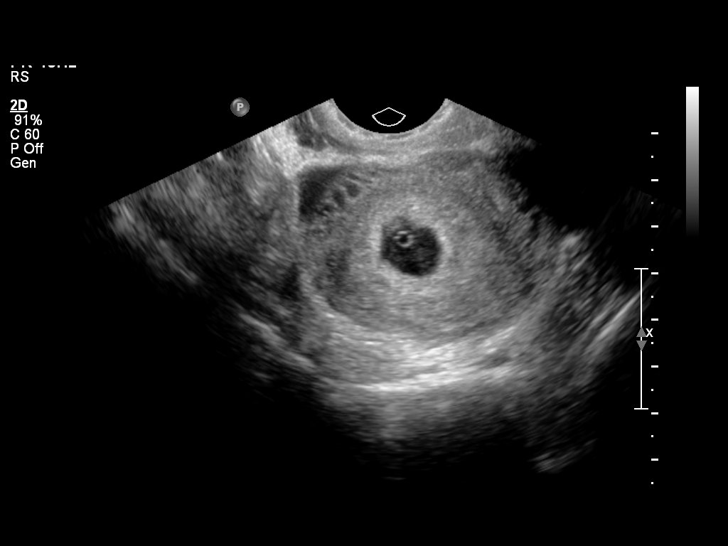
[im 10/25]
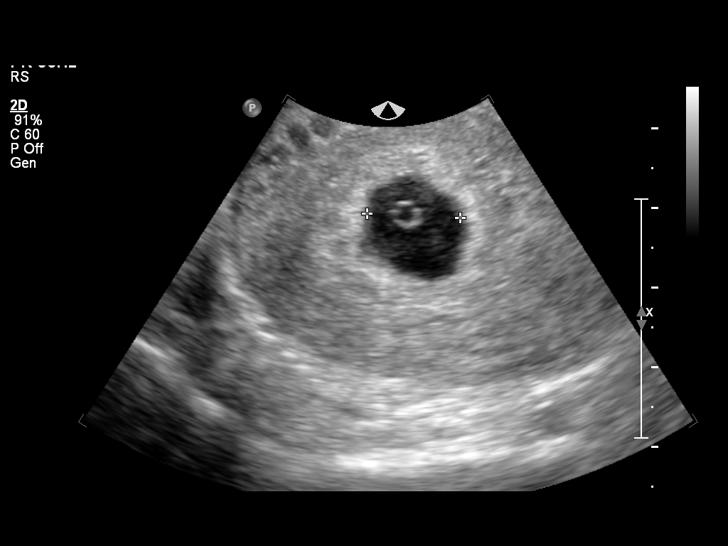
[im 12/25]
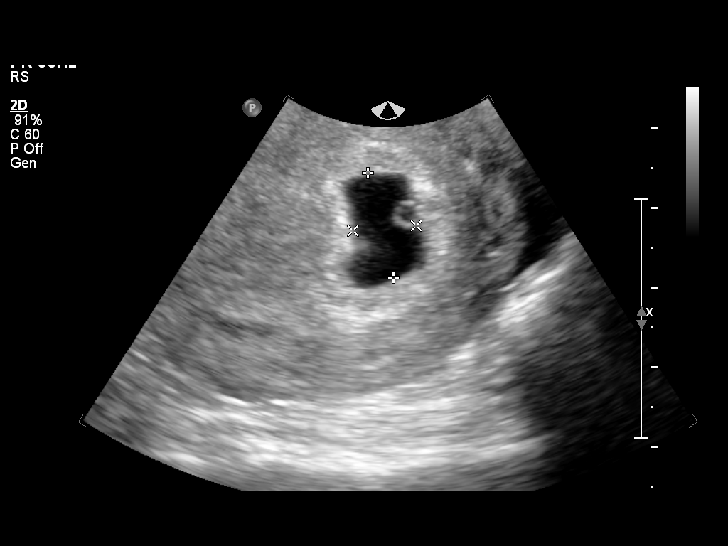
[im 14/25]
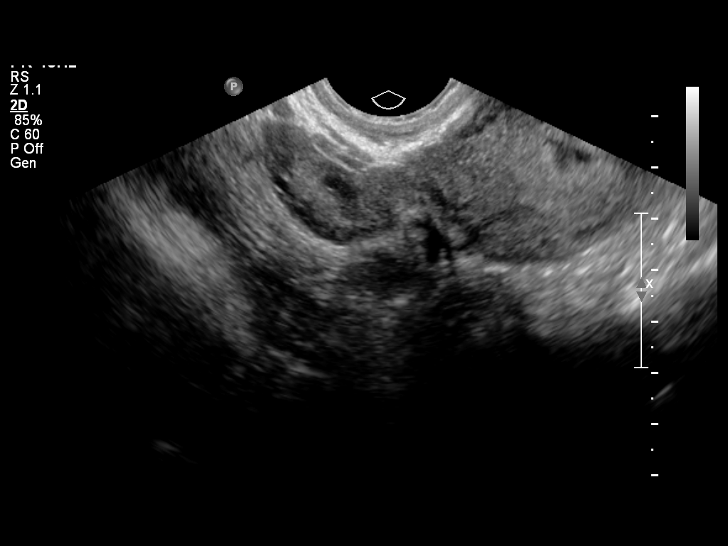
[im 16/25]
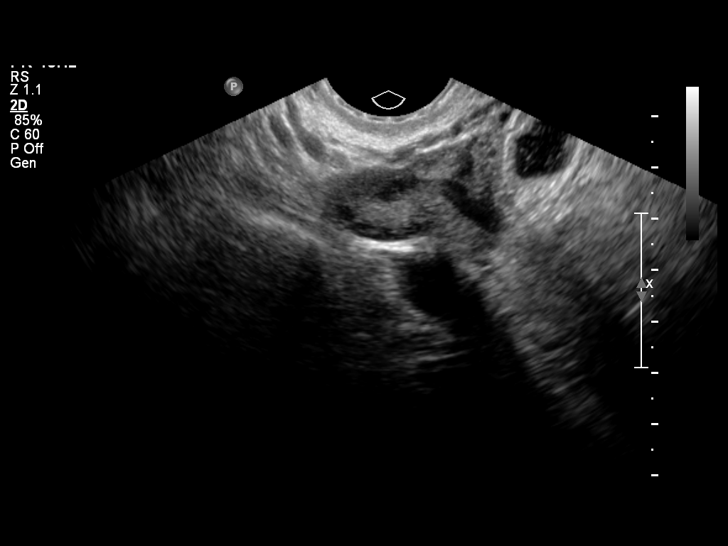
[im 17/25]
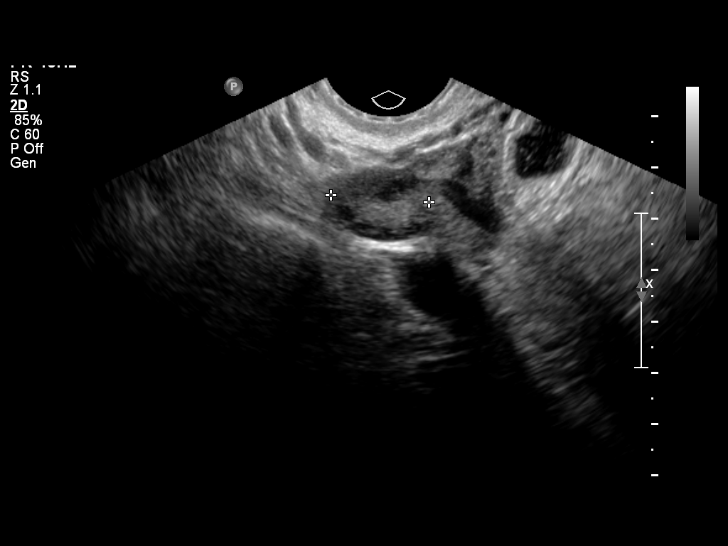
[im 19/25]
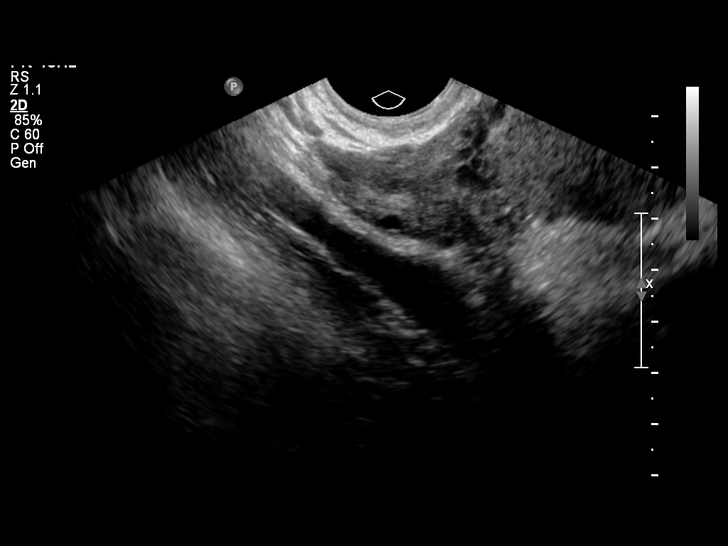
[im 21/25]
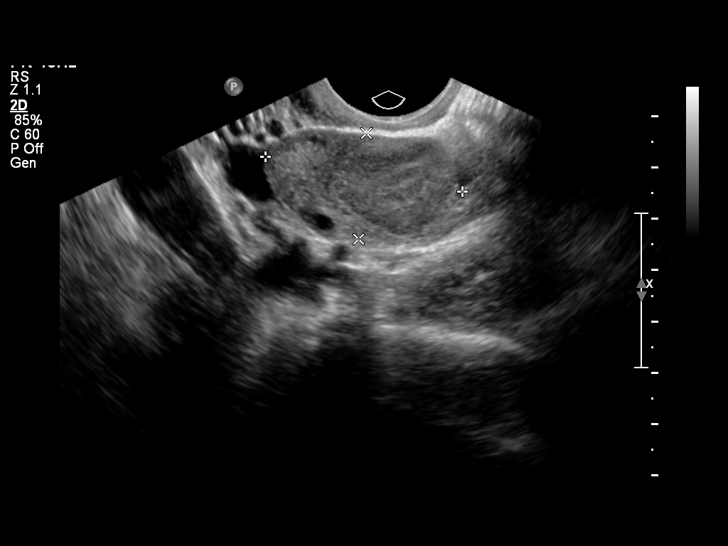
[im 23/25]
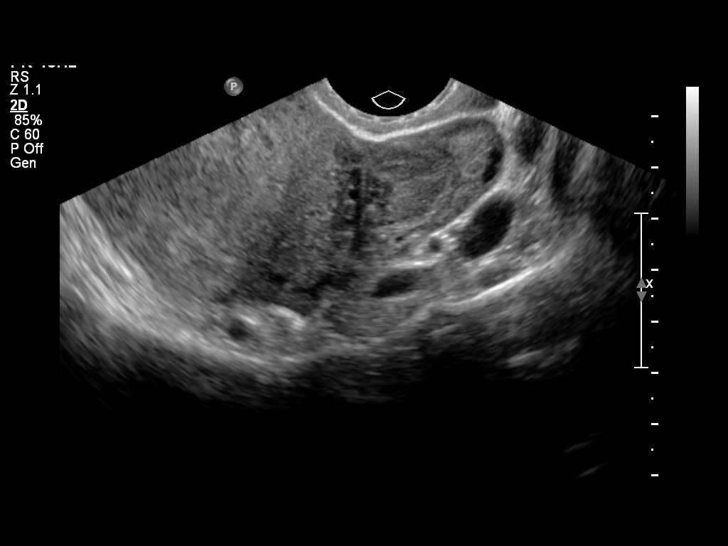
[im 25/25]
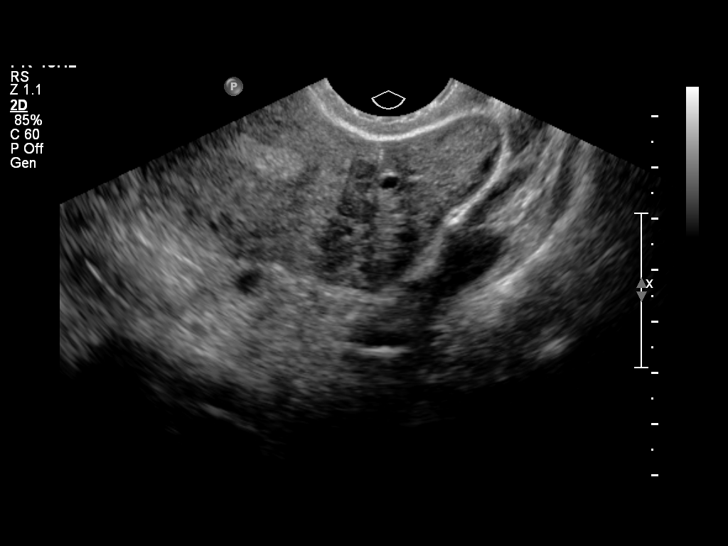

[14 of 25 positions shown; findings below may reference images not displayed]

Intrauterine gestational sac:  Single.
Yolk sac: Visualized
Embryo: Not visualized
Cardiac Activity: N/A

MSD: 11.1  mm  5    w 6    d
US EDC: 05/18/2012

Maternal uterus/adnexae:
No evidence for subchorionic hemorrhage.  The maternal ovaries are
sonographically normal with probable hemorrhagic corpus luteum cyst
on the left.  No evidence for intraperitoneal free fluid.
IMPRESSION: Single intrauterine gestational sac with visualized yolk sac but no
embryo yet apparent.  Mean sac diameter estimates a 5-week-8-day
gestational age.

## 2013-09-08 ENCOUNTER — Inpatient Hospital Stay (HOSPITAL_COMMUNITY)
Admission: AD | Admit: 2013-09-08 | Discharge: 2013-09-08 | Disposition: A | Discharge status: Home / Self Care | Payer: Medicaid Other | Source: Ambulatory Visit | Attending: Obstetrics & Gynecology | Admitting: Obstetrics & Gynecology

## 2013-09-08 ENCOUNTER — Inpatient Hospital Stay (HOSPITAL_COMMUNITY): Payer: Medicaid Other

## 2013-09-08 ENCOUNTER — Encounter (HOSPITAL_COMMUNITY): Payer: Self-pay | Admitting: *Deleted

## 2013-09-08 DIAGNOSIS — R109 Unspecified abdominal pain: Secondary | ICD-10-CM | POA: Insufficient documentation

## 2013-09-08 DIAGNOSIS — IMO0002 Reserved for concepts with insufficient information to code with codable children: Secondary | ICD-10-CM | POA: Insufficient documentation

## 2013-09-08 DIAGNOSIS — O0480 (Induced) termination of pregnancy with unspecified complications: Secondary | ICD-10-CM

## 2013-09-08 DIAGNOSIS — F17200 Nicotine dependence, unspecified, uncomplicated: Secondary | ICD-10-CM | POA: Insufficient documentation

## 2013-09-08 LAB — URINALYSIS, ROUTINE W REFLEX MICROSCOPIC
Leukocytes, UA: NEGATIVE
Nitrite: NEGATIVE
Specific Gravity, Urine: >1.03 — ABNORMAL HIGH (ref 1.005–1.030)
Urobilinogen, UA: 0.2 mg/dL (ref 0.0–1.0)
pH: 5.5 (ref 5.0–8.0)

## 2013-09-08 LAB — CBC WITH DIFFERENTIAL/PLATELET
Basophils Absolute: 0 10*3/uL (ref 0.0–0.1)
Eosinophils Absolute: 0.2 10*3/uL (ref 0.0–0.7)
Eosinophils Relative: 3 % (ref 0–5)
Hemoglobin: 12.3 g/dL (ref 12.0–15.0)
Lymphocytes Relative: 44 % (ref 12–46)
Lymphs Abs: 3.5 10*3/uL (ref 0.7–4.0)
MCH: 31.9 pg (ref 26.0–34.0)
MCV: 95.1 fL (ref 78.0–100.0)
Monocytes Absolute: 0.6 10*3/uL (ref 0.1–1.0)
Neutro Abs: 3.5 10*3/uL (ref 1.7–7.7)
Platelets: 315 10*3/uL (ref 150–400)
RDW: 13.1 % (ref 11.5–15.5)
WBC: 7.9 10*3/uL (ref 4.0–10.5)

## 2013-09-08 LAB — WET PREP, GENITAL
Trich, Wet Prep: NONE SEEN
Yeast Wet Prep HPF POC: NONE SEEN

## 2013-09-08 LAB — HCG, QUANTITATIVE, PREGNANCY: hCG, Beta Chain, Quant, S: 1 m[IU]/mL (ref <5)

## 2013-09-08 LAB — URINE MICROSCOPIC-ADD ON

## 2013-09-08 MED ORDER — OXYCODONE-ACETAMINOPHEN 5-325 MG PO TABS
1.0000 | ORAL_TABLET | Freq: Once | ORAL | Status: AC
  Administered 2013-09-08: 1 via ORAL
  Filled 2013-09-08: qty 1

## 2013-09-08 MED ORDER — OXYCODONE-ACETAMINOPHEN 5-325 MG PO TABS: 1.0000 | ORAL_TABLET | ORAL | Status: DC | PRN

## 2013-09-08 MED ORDER — NORETHINDRONE-ETH ESTRADIOL 0.4-35 MG-MCG PO TABS: 1.0000 | ORAL_TABLET | Freq: Every day | ORAL | Status: DC

## 2013-09-08 MED ORDER — DIPHENHYDRAMINE HCL 25 MG PO CAPS
25.0000 mg | ORAL_CAPSULE | Freq: Four times a day (QID) | ORAL | Status: DC | PRN
  Administered 2013-09-08: 25 mg via ORAL
  Filled 2013-09-08: qty 1

## 2013-09-08 MED ORDER — KETOROLAC TROMETHAMINE 30 MG/ML IJ SOLN
30.0000 mg | Freq: Once | INTRAMUSCULAR | Status: AC
  Administered 2013-09-08: 30 mg via INTRAMUSCULAR
  Filled 2013-09-08: qty 1

## 2013-09-08 NOTE — MAU Provider Note (Signed)
History     CSN: [id number]  Arrival date and time: 09/08/13 1229   First Provider Initiated Contact with Patient 09/08/13 1335      Chief Complaint  Patient presents with  . Vaginal Bleeding  . Abdominal Pain   HPI This is a 27 y.o. female who presents with c/o bleeding and abdominal pain for one month since having a 18 week pregnancy termination. States has clots and cramps. Not sure how many pads per day. No fever. Has not called the clinic about any of these symptoms  RN note: Patient presents with complaints of vaginal bleeding and abdominal pain X 1 month ago following a pregnancy termination.      OB History as of 09/08/13   Grav Para Term Preterm Abortions TAB SAB Ect Mult Living   7 4 4  0 3 3 0 0 0 4      Past Medical History  Diagnosis Date  . Anxiety   . PIH (pregnancy induced hypertension)   . Bronchitis   . Seasonal allergies   . Pregnancy induced hypertension   . Seizure     pseudoseizures; stress related  . Pyelonephritis     Past Surgical History  Procedure Laterality Date  . Abortions    . Dilation and curettage of uterus      ? retained POC     Family History  Problem Relation Age of Onset  . Hypertension Mother   . Diabetes Maternal Aunt   . Hypertension Maternal Grandmother   . Diabetes Maternal Grandmother   . Heart disease Maternal Grandmother   . Hypertension Maternal Grandfather   . Heart disease Maternal Grandfather   . Anesthesia problems Neg Hx   . Hypotension Neg Hx   . Malignant hyperthermia Neg Hx   . Pseudochol deficiency Neg Hx   . Other Neg Hx   . Hearing loss Neg Hx     History  Substance Use Topics  . Smoking status: Current Some Day Smoker -- 7 years    Types: Cigarettes  . Smokeless tobacco: Never Used  . Alcohol Use: No    Allergies:  Allergies  Allergen Reactions  . Bactrim [Sulfamethoxazole-Trimethoprim] Nausea And Vomiting and Other (See Comments)    high fevers.     Prescriptions prior to  admission  Medication Sig Dispense Refill  . acetaminophen (TYLENOL) 500 MG tablet Take 1,000 mg by mouth every 6 (six) hours as needed for mild pain.      . Hyprom-Naphaz-Polysorb-Zn Sulf (CLEAR EYES COMPLETE OP) Apply 1 drop to eye daily as needed (for dryness).      Marland Kitchen albuterol (PROVENTIL HFA;VENTOLIN HFA) 108 (90 BASE) MCG/ACT inhaler Inhale 2 puffs into the lungs every 6 (six) hours as needed. For shortness of breath  1 Inhaler  2    Review of Systems  Constitutional: Negative for fever, chills and malaise/fatigue.  Gastrointestinal: Positive for abdominal pain. Negative for nausea, vomiting, diarrhea and constipation.  Genitourinary: Negative for dysuria.       Vaginal bleeding  Neurological: Negative for dizziness.   Physical Exam   Blood pressure 144/98, pulse 98, temperature 98.5 F (36.9 C), temperature source Oral, resp. rate 18, height 5\' 4"  (1.626 m), weight 75.467 kg (166 lb 6 oz), last menstrual period 03/22/2013, unknown if currently breastfeeding.  Physical Exam  Constitutional: She is oriented to person, place, and time. She appears well-developed and well-nourished. No distress.  Cardiovascular: Normal rate.   Respiratory: Effort normal.  GI: Soft. She exhibits no  distension and no mass. There is tenderness (mild tenderness lower abdomen). There is no rebound and no guarding.  Genitourinary: Uterus normal. Vaginal discharge (small to moderate clots in vault, cervix closed, uterus small and firm, somewhat tender) found.  Musculoskeletal: Normal range of motion.  Neurological: She is alert and oriented to person, place, and time.  Skin: Skin is warm and dry.  Psychiatric: She has a normal mood and affect.    MAU Course  Procedures  MDM Results for orders placed during the hospital encounter of 09/08/13 (from the past 72 hour(s))  URINALYSIS, ROUTINE W REFLEX MICROSCOPIC     Status: Abnormal   Collection Time    09/08/13  1:15 PM      Result Value Range    Color, Urine YELLOW  YELLOW   APPearance CLEAR  CLEAR   Specific Gravity, Urine >1.030 (*) 1.005 - 1.030   pH 5.5  5.0 - 8.0   Glucose, UA NEGATIVE  NEGATIVE mg/dL   Hgb urine dipstick MODERATE (*) NEGATIVE   Bilirubin Urine NEGATIVE  NEGATIVE   Ketones, ur NEGATIVE  NEGATIVE mg/dL   Protein, ur NEGATIVE  NEGATIVE mg/dL   Urobilinogen, UA 0.2  0.0 - 1.0 mg/dL   Nitrite NEGATIVE  NEGATIVE   Leukocytes, UA NEGATIVE  NEGATIVE  URINE MICROSCOPIC-ADD ON     Status: Abnormal   Collection Time    09/08/13  1:15 PM      Result Value Range   Squamous Epithelial / LPF FEW (*) RARE   WBC, UA 0-2  <3 WBC/hpf   RBC / HPF 0-2  <3 RBC/hpf   Bacteria, UA FEW (*) RARE  POCT PREGNANCY, URINE     Status: None   Collection Time    09/08/13  1:19 PM      Result Value Range   Preg Test, Ur NEGATIVE  NEGATIVE   Comment:            THE SENSITIVITY OF THIS     METHODOLOGY IS >24 mIU/mL  CBC WITH DIFFERENTIAL     Status: Abnormal   Collection Time    09/08/13  1:51 PM      Result Value Range   WBC 7.9  4.0 - 10.5 K/uL   RBC 3.86 (*) 3.87 - 5.11 MIL/uL   Hemoglobin 12.3  12.0 - 15.0 g/dL   HCT 16.1  09.6 - 04.5 %   MCV 95.1  78.0 - 100.0 fL   MCH 31.9  26.0 - 34.0 pg   MCHC 33.5  30.0 - 36.0 g/dL   RDW 40.9  81.1 - 91.4 %   Platelets 315  150 - 400 K/uL   Neutrophils Relative % 45  43 - 77 %   Neutro Abs 3.5  1.7 - 7.7 K/uL   Lymphocytes Relative 44  12 - 46 %   Lymphs Abs 3.5  0.7 - 4.0 K/uL   Monocytes Relative 8  3 - 12 %   Monocytes Absolute 0.6  0.1 - 1.0 K/uL   Eosinophils Relative 3  0 - 5 %   Eosinophils Absolute 0.2  0.0 - 0.7 K/uL   Basophils Relative 1  0 - 1 %   Basophils Absolute 0.0  0.0 - 0.1 K/uL  HCG, QUANTITATIVE, PREGNANCY     Status: None   Collection Time    09/08/13  1:51 PM      Result Value Range   hCG, Beta Chain, Quant, S 1  <5 mIU/mL  Comment:              GEST. AGE      CONC.  (mIU/mL)       <=1 WEEK        5 - 50         2 WEEKS       50 - 500         3  WEEKS       100 - 10,000         4 WEEKS     1,000 - 30,000         5 WEEKS     3,500 - 115,000       6-8 WEEKS     12,000 - 270,000        12 WEEKS     15,000 - 220,000                FEMALE AND NON-PREGNANT FEMALE:         LESS THAN 5 mIU/mL  GC/CHLAMYDIA PROBE AMP     Status: None   Collection Time    09/08/13  3:15 PM      Result Value Range   CT Probe RNA NEGATIVE  NEGATIVE   GC Probe RNA NEGATIVE  NEGATIVE   Comment: (NOTE)                                                                                               **Normal Reference Range: Negative**          Assay performed using the Gen-Probe APTIMA COMBO2 (R) Assay.     Acceptable specimen types for this assay include APTIMA Swabs (Unisex,     endocervical, urethral, or vaginal), first void urine, and ThinPrep     liquid based cytology samples.     Performed at [facility]  WET PREP, GENITAL     Status: Abnormal   Collection Time    09/08/13  3:15 PM      Result Value Range   Yeast Wet Prep HPF POC NONE SEEN  NONE SEEN   Trich, Wet Prep NONE SEEN  NONE SEEN   Clue Cells Wet Prep HPF POC NONE SEEN  NONE SEEN   WBC, Wet Prep HPF POC FEW (*) NONE SEEN   Comment: FEW BACTERIA SEEN   US Pelvis Complete  09/08/2013   CLINICAL DATA:  Recent abortion 1 month ago at 18 weeks, prolonged bleeding and pain, question retained products of conception  EXAM: TRANSABDOMINAL AND TRANSVAGINAL ULTRASOUND OF PELVIS  TECHNIQUE: Both transabdominal and transvaginal ultrasound examinations of the pelvis were performed. Transabdominal technique was performed for global imaging of the pelvis including uterus, ovaries, adnexal regions, and pelvic cul-de-sac. It was necessary to proceed with endovaginal exam following the transabdominal exam to visualize the endometrium and ovaries.  COMPARISON:  Ob ultrasound 05/08/2013    FINDINGS: Uterus  Measurements: 8.6 x 4.3 x 6.7 cm. Normal echogenicity of morphology without mass.  Endometrium   Thickness: 8 mm sec. No endometrial fluid. Minimal vascularity seen within endometrial canal on left without focal mass  or abnormal echogenicity.  Right ovary  Measurements: 3.3 x 1.8 x 3.2 cm.  Normal morphology without mass.  Left ovary  Measurements: 3.8 x 1.9 x 2.5 cm. Small collapsed cyst 1.8 x 1.2 x 1.7 cm. No additional mass.  Other findings  No free pelvic fluid or adnexal masses.    IMPRESSION: No definite focal uterine abnormalities identified to suggest retained products of conception.  Small collapsed cyst left ovary.     Electronically Signed   By: Ulyses Southward M.D.   On: 09/08/2013 15:08    Assessment and Plan  A:  Bleeding post 18 week termination      Good hemoglobin level      No retained products of conception per [place] evaluation  P:  Discussed findings       Discussed options for contraception       Will Rx OCPs.        Check BP in a month  - will follow up in Health Dept          Medication List         acetaminophen 500 MG tablet  Commonly known as:  TYLENOL  Take 1,000 mg by mouth every 6 (six) hours as needed for mild pain.     albuterol 108 (90 BASE) MCG/ACT inhaler  Commonly known as:  PROVENTIL HFA;VENTOLIN HFA  Inhale 2 puffs into the lungs every 6 (six) hours as needed. For shortness of breath     CLEAR EYES COMPLETE OP  Apply 1 drop to eye daily as needed (for dryness).     norethindrone-ethinyl estradiol 0.4-35 MG-MCG tablet  Commonly known as:  OVCON-35,BALZIVA,BRIELLYN  Take 1 tablet by mouth daily.     oxyCODONE-acetaminophen 5-325 MG per tablet  Commonly known as:  ROXICET  Take 1 tablet by mouth every 4 (four) hours as needed for severe pain.         [facility] 09/08/2013, 2:34 PM

## 2013-09-08 NOTE — MAU Note (Signed)
Patient presents with complaints of vaginal bleeding and abdominal pain X 1 month ago following a pregnancy termination.

## 2013-09-10 LAB — GC/CHLAMYDIA PROBE AMP
CT Probe RNA: NEGATIVE
GC Probe RNA: NEGATIVE

## 2013-09-16 NOTE — MAU Provider Note (Signed)
Attestation of Attending Supervision of Advanced Practitioner (CNM/NP): Evaluation and management procedures were performed by the Advanced Practitioner under my supervision and collaboration. I have reviewed the Advanced Practitioner's note and chart, and I agree with the management and plan.  Zaidin Blyden H. 12:28 PM   

## 2013-10-09 ENCOUNTER — Emergency Department (HOSPITAL_COMMUNITY)
Admission: EM | Admit: 2013-10-09 | Discharge: 2013-10-09 | Disposition: A | Discharge status: Home / Self Care | Payer: Medicaid Other | Attending: Emergency Medicine | Admitting: Emergency Medicine

## 2013-10-09 ENCOUNTER — Encounter (HOSPITAL_COMMUNITY): Payer: Self-pay | Admitting: Emergency Medicine

## 2013-10-09 DIAGNOSIS — F17200 Nicotine dependence, unspecified, uncomplicated: Secondary | ICD-10-CM | POA: Insufficient documentation

## 2013-10-09 DIAGNOSIS — IMO0002 Reserved for concepts with insufficient information to code with codable children: Secondary | ICD-10-CM | POA: Insufficient documentation

## 2013-10-09 DIAGNOSIS — O139 Gestational [pregnancy-induced] hypertension without significant proteinuria, unspecified trimester: Secondary | ICD-10-CM | POA: Insufficient documentation

## 2013-10-09 DIAGNOSIS — K0381 Cracked tooth: Secondary | ICD-10-CM | POA: Insufficient documentation

## 2013-10-09 DIAGNOSIS — Z881 Allergy status to other antibiotic agents status: Secondary | ICD-10-CM | POA: Insufficient documentation

## 2013-10-09 DIAGNOSIS — N12 Tubulo-interstitial nephritis, not specified as acute or chronic: Secondary | ICD-10-CM | POA: Insufficient documentation

## 2013-10-09 DIAGNOSIS — R569 Unspecified convulsions: Secondary | ICD-10-CM | POA: Insufficient documentation

## 2013-10-09 DIAGNOSIS — K029 Dental caries, unspecified: Secondary | ICD-10-CM | POA: Insufficient documentation

## 2013-10-09 DIAGNOSIS — J309 Allergic rhinitis, unspecified: Secondary | ICD-10-CM | POA: Insufficient documentation

## 2013-10-09 DIAGNOSIS — Z888 Allergy status to other drugs, medicaments and biological substances status: Secondary | ICD-10-CM | POA: Insufficient documentation

## 2013-10-09 DIAGNOSIS — K0889 Other specified disorders of teeth and supporting structures: Secondary | ICD-10-CM

## 2013-10-09 DIAGNOSIS — F411 Generalized anxiety disorder: Secondary | ICD-10-CM | POA: Insufficient documentation

## 2013-10-09 MED ORDER — HYDROCODONE-ACETAMINOPHEN 5-325 MG PO TABS: 1.0000 | ORAL_TABLET | ORAL | Status: DC | PRN

## 2013-10-09 MED ORDER — LIDOCAINE VISCOUS 2 % MT SOLN: OROMUCOSAL | Status: DC

## 2013-10-09 NOTE — ED Notes (Signed)
Pt unhappy with Vicodin prescription- PA aware.

## 2013-10-09 NOTE — ED Provider Notes (Signed)
CSN: [id number]     Arrival date & time 10/09/13  1618 History   First MD Initiated Contact with Patient 10/09/13 1735     Chief Complaint  Patient presents with  . Dental Pain   (Consider location/radiation/quality/duration/timing/severity/associated sxs/prior Treatment) The history is provided by the patient and medical records.   This is a 28 y.o. F  Presenting to the ED for dental pain.  Pt states every time she has attempted to eat today, she has felt small pieces of her left upper molar "breaking off".  States she tried eating soft foods like chicken noodle soup but more of the tooth broke off.  Denies any fevers or chills.  No numbness or paresthesias of face. States she has tried applying topical anbesol and orajel without noted improvement.  Pt does not currently have a dentist.   Past Medical History  Diagnosis Date  . Anxiety   . PIH (pregnancy induced hypertension)   . Bronchitis   . Seasonal allergies   . Pregnancy induced hypertension   . Seizure     pseudoseizures; stress related  . Pyelonephritis    Past Surgical History  Procedure Laterality Date  . Abortions    . Dilation and curettage of uterus      ? retained POC    Family History  Problem Relation Age of Onset  . Hypertension Mother   . Diabetes Maternal Aunt   . Hypertension Maternal Grandmother   . Diabetes Maternal Grandmother   . Heart disease Maternal Grandmother   . Hypertension Maternal Grandfather   . Heart disease Maternal Grandfather   . Anesthesia problems Neg Hx   . Hypotension Neg Hx   . Malignant hyperthermia Neg Hx   . Pseudochol deficiency Neg Hx   . Other Neg Hx   . Hearing loss Neg Hx    History  Substance Use Topics  . Smoking status: Current Some Day Smoker -- 7 years    Types: Cigarettes  . Smokeless tobacco: Never Used  . Alcohol Use: No   OB History   Grav Para Term Preterm Abortions TAB SAB Ect Mult Living   7 4 4  0 3 3 0 0 0 4     Review of Systems  HENT: Positive  for dental problem.   All other systems reviewed and are negative.    Allergies  Bactrim and Ibuprofen  Home Medications   Current Outpatient Rx  Name  Route  Sig  Dispense  Refill  . acetaminophen (TYLENOL) 500 MG tablet   Oral   Take 1,000 mg by mouth every 6 (six) hours as needed for mild pain.         Marland Kitchen. albuterol (PROVENTIL HFA;VENTOLIN HFA) 108 (90 BASE) MCG/ACT inhaler   Inhalation   Inhale 2 puffs into the lungs every 6 (six) hours as needed. For shortness of breath   1 Inhaler   2   . HYDROcodone-acetaminophen (NORCO/VICODIN) 5-325 MG per tablet   Oral   Take 1 tablet by mouth every 4 (four) hours as needed.   10 tablet   0   . Hyprom-Naphaz-Polysorb-Zn Sulf (CLEAR EYES COMPLETE OP)   Ophthalmic   Apply 1 drop to eye daily as needed (for dryness).         Marland Kitchen. lidocaine (XYLOCAINE) 2 % solution      Apply to affected tooth as needed for pain relief.   50 mL   0   . norethindrone-ethinyl estradiol (OVCON-35,BALZIVA,BRIELLYN) 0.4-35 MG-MCG tablet  Oral   Take 1 tablet by mouth daily.   1 Package   11   . oxyCODONE-acetaminophen (ROXICET) 5-325 MG per tablet   Oral   Take 1 tablet by mouth every 4 (four) hours as needed for severe pain.   10 tablet   0    BP 131/95  Pulse 89  Temp(Src) 98.7 F (37.1 C) (Oral)  Resp 16  SpO2 99%  LMP 09/13/2013  Physical Exam  Nursing note and vitals reviewed. Constitutional: She is oriented to person, place, and time. She appears well-developed and well-nourished.  HENT:  Head: Normocephalic and atraumatic.  Mouth/Throat: Uvula is midline, oropharynx is clear and moist and mucous membranes are normal. Abnormal dentition. Dental caries present. No dental abscesses or uvula swelling. No oropharyngeal exudate, posterior oropharyngeal edema, posterior oropharyngeal erythema or tonsillar abscesses.    Teeth largely in poor dentition, left upper molar broken with large cavity present, surrounding gingiva normal in  appearance without signs of dental abscess, handling secretions appropriately, no trismus  Eyes: Conjunctivae and EOM are normal. Pupils are equal, round, and reactive to light.  Neck: Normal range of motion.  Cardiovascular: Normal rate, regular rhythm and normal heart sounds.   Pulmonary/Chest: Effort normal and breath sounds normal. No respiratory distress. She has no wheezes.  Musculoskeletal: Normal range of motion.  Neurological: She is alert and oriented to person, place, and time.  Skin: Skin is warm and dry.  Psychiatric: She has a normal mood and affect.    ED Course  Procedures (including critical care time) Labs Review Labs Reviewed - No data to display Imaging Review No results found.  EKG Interpretation   None       MDM   1. Pain, dental    Dental pain due to broken tooth, no signs of dental abscess.  Rx vicodin and viscous lidocaine.  FU with dentist-- referrals provided.  At time of discharge, pt unhappy with Rx for vicodin-- first states it makes her itch, then states it doesn't work.  I have advised her to take benadryl with this if needed but i will not change her prescription to percocet.  Pt remains unhappy and states "ok, i just wanna go."  Pt discharged.  Garlon Hatchet, PA-C 10/09/13 1905

## 2013-10-09 NOTE — Discharge Instructions (Signed)
Take the prescribed medication as directed. °Follow-up with dentist-- referral and resource guide provided to help with this. °Return to the ED for new or worsening symptoms. ° ° °Emergency Department Resource Guide °1) Find a Doctor and Pay Out of Pocket °Although you won't have to find out who is covered by your insurance plan, it is a good idea to ask around and get recommendations. You will then need to call the office and see if the doctor you have chosen will accept you as a new patient and what types of options they offer for patients who are self-pay. Some doctors offer discounts or will set up payment plans for their patients who do not have insurance, but you will need to ask so you aren't surprised when you get to your appointment. ° °2) Contact Your Local Health Department °Not all health departments have doctors that can see patients for sick visits, but many do, so it is worth a call to see if yours does. If you don't know where your local health department is, you can check in your phone book. The CDC also has a tool to help you locate your state's health department, and many state websites also have listings of all of their local health departments. ° °3) Find a Walk-in Clinic °If your illness is not likely to be very severe or complicated, you may want to try a walk in clinic. These are popping up all over the country in pharmacies, drugstores, and shopping centers. They're usually staffed by nurse practitioners or physician assistants that have been trained to treat common illnesses and complaints. They're usually fairly quick and inexpensive. However, if you have serious medical issues or chronic medical problems, these are probably not your best option. ° °No Primary Care Doctor: °- Call [facility] at  832-8000 - they can help you locate a primary care doctor that  accepts your insurance, provides certain services, etc. °- Physician Referral Service- 1-800-533-3463 ° °Chronic Pain  Problems: °Organization         Address  Phone   Notes  °[facility]  (336) 297-2271 Patients need to be referred by their primary care doctor.  ° °Medication Assistance: °Organization         Address  Phone   Notes  °[place] [facility] [address] °[place], [place] [address] (336) 641-8030 --Must be a resident of [place] °-- Must have NO insurance coverage whatsoever (no Medicaid/ Medicare, etc.) °-- The pt. MUST have a primary care doctor that directs their care regularly and follows them in the community °  °[facility]  (866) 331-1348   °[employer]  (888) 892-1162   ° °Agencies that provide inexpensive medical care: °Organization         Address  Phone   Notes  °[facility]  (336) 832-8035   °[facility]    (336) 832-7272   °[facility] [address] °[place], [place] [address] (336) 832-4777   °[facility] [address], °[place] (336) 271-4999   °[facility]    (336) 373-0678   °[facility]    (336) 272-1050   °[facility] ° [address], [place] Phone:  (336) 832-4444, Fax:  (336) 832-4440 Hours of Operation:  9 am - 6 pm, M-F.  Also accepts Medicaid/Medicare and self-pay.  °[facility] ° [address], [place] Phone: (336) 832-3150, Fax: (336) 832-3151. Hours of Operation:  8:30 am - 5:30 pm, M-F.  Also accepts Medicaid and self-pay.  °[facility] [place] [address], [place] Phone: (336) 878-6027   °[facility] [address], [place], [place] (336)723-1848, Ext. 123 Mondays & Thursdays: 7-9 AM.  First 15 patients are seen on a first come, first serve basis. °  ° °Medicaid-accepting [place] Providers: ° °Organization         Address  Phone   Notes  °[facility] [address], [place] (336) 641-2100 Also  accepts self-pay patients.  °[facility] [address], [place] ° (336) 856-9996   °[facility] [address], [place] (336) 288-8857   °[facility] [address] [place] [address], [place] (336) 299-7000   °Veita Bland [address], [place]  ° (336) 373-1557 Only accepts [place] Access Medicaid patients after they have their name applied to their card.  ° °Self-Pay (no insurance) in [place]: ° °Organization         Address  Phone   Notes  °Sickle Cell Patients, [facility] [address], [place] (336) 832-1970   °[facility] [address], [place] (336) 832-4400   °[facility] ° [address], [place] (336) 992-4800   °[facility]/Dr. Osei-Bonsu ° [address] [place] [address], [place] or [address], [place] (336) 841-8500 Phone number for both [place] and [place] locations is the same.  °[facility] [address], [place] (336) 299-0000   °[facility] [address] [place] [address], [place] or [address] (336) 852-7530 °(336) 878-2260   °[facility] [address], [place] (336) 350-1642, phone; (336) 294-5005, fax Sees patients 1st and 3rd Saturday of every month.  Must not qualify for public or private insurance (i.e. Medicaid, Medicare, [place] Health Choice, Veterans' Benefits) • Household income should be no more than 200% of the poverty level •The clinic cannot treat you if you are pregnant or think you are pregnant • Sexually transmitted diseases are not treated at the clinic.  ° ° °Dental Care: °Organization         Address  Phone  Notes  °[facility] [facility] [address], [place] (336) 641-6152 Accepts children up to age 21 who are enrolled in Medicaid or [place] Health Choice; pregnant  women with a Medicaid card; and children who have applied for Medicaid or [place] Health Choice, but were declined, whose parents can pay a reduced fee at time of service.  °[facility] [place]  [address], [place] (336) 641-7733 Accepts children up to age 21 who are enrolled in Medicaid or [place] Health Choice; pregnant women with a Medicaid card; and children who have applied for Medicaid or [place] Health Choice, but were declined, whose parents can pay a reduced fee at time of service.  °[facility] ° [address],  (336) 641-4533 Patients are seen by appointment only. Walk-ins are not accepted. [facility] will see patients 18 years of age and older. °Monday - Tuesday (8am-5pm) °Most Wednesdays (8:30-5pm) °$30 per visit, cash only  °[facility] ° [address], [place] (336) 641-4533 Patients are seen by appointment only. Walk-ins are not accepted. [facility] will see patients 18 years of age and older. °One Wednesday Evening (Monthly: Volunteer Based).  $30 per visit, cash only  °[facility]  (919) 537-3737 for adults; Children under age 4, call Graduate Pediatric Dentistry at ([phone]) [phone]. Children aged 4-14, please call (919) [phone] to request a pediatric application. ° Dental services are provided in all areas of dental care including fillings, crowns and bridges, complete and partial dentures, implants, gum treatment, root canals, and extractions. Preventive care is also provided. Treatment is provided to both adults and children. °Patients are selected via a lottery and there is often a waiting list. °  °[facility] [address], °[place] ° (336) 763-8833 [email or web address] °  °[facility] [address], [place], [place] (336)723-1848, Ext. 123 Second and Fourth Thursday of each month, opens at 6:30 AM; Clinic ends at 9 AM.  Patients are  seen on a first-come first-served basis, and a limited number are seen during each clinic.  ° °[facility] ° [address], [place], [place] (336) 723-7904   Eligibility Requirements °You must have lived in [place], [place], or [place] counties for at least the last three months. °  You cannot be eligible for state or federal sponsored healthcare insurance, including Veterans Administration, Medicaid, or Medicare. °  You generally cannot be eligible for healthcare insurance through your employer.  °  How to apply: °Eligibility screenings are held every Tuesday and Wednesday afternoon from 1:00 pm until 4:00 pm. You do not need an appointment for the interview!  °[facility] [address], [place], [place] 336-631-2330   °[facility]  336-342-8273   °[facility]  336-703-3100   °[facility]  336-570-6415   ° °Behavioral Health Resources in the Community: °Intensive Outpatient Programs °Organization         Address  Phone  Notes  °[facility] [address], [place], [place] 336-878-6098   °[facility] [address], [place], [place] 336-832-9800   °[facility] [address], [place], [place] ° 336-882-2125   °[facility] [address],  °[place], [place] 1-800-853-5163 or 336-641-4981   °Substance Abuse Resources °Organization         Address  Phone  Notes  °[facility]  336-882-2125   °[facility]  336-784-9470   °[facility]  336-285-9073   °[facility]  336-845-3988   °Residential & Outpatient Substance Abuse Program  1-800-659-3381   °Psychological Services °Organization         Address  Phone  Notes  °[facility]  336- 832-9600   °[facility]  336- 378-7881   °[facility] [address], [place] 1-800-853-5163 or 336-641-4981   ° °Mobile Crisis  Teams °Organization         Address  Phone  Notes  °[facility], Mobile Crisis Care Unit  1-877-626-1772   °Assertive °[facility] ° [address] [place], [place] 336-834-9664   °Sharon DeEsch [address] °[place] 336-554-5454   ° °Self-Help/Support Groups °Organization         Address  Phone             Notes  °[facility] [place] -   variety of support groups  336- 373-1402 Call for more information  °Narcotics Anonymous (NA), [facility] [address], °[place]  2 meetings at this location  ° °Residential Treatment Programs °Organization         Address  Phone  Notes  °[facility] [address],    °[place]  1-866-801-8205   °[facility] ° [address], [place], [place] 704-293-8524   °[facility] [address], [place] 336-845-3988 Admissions: 8am-3pm M-F  °[facility] [address]    °[place], [place] 336-841-1104   °[facility] [address], [place], [place] 336-379-7146   °[facility] [address]  °[place], [place] 336-285-9073   °[facility] Intensive Outpatient [address], [place], [place] 336-852-3033   °[facility] [address]  °[place], [place] 1-877-615-2722 or 336-784-9470   °[facility] [address] [place], [place] 336-227-7417 Accepts Medicaid  °[facility] [address]  °[place] 1-800-659-3381 Substance Abuse/Addiction Treatment  ° °[place] Behavioral Health Resources °Organization         Address  Phone  Notes  °[facility]  (888) 581-9988   °Julie Brannon, PhD [address] [place], [place]   (336) 349-5553 or (336) 951-0000   °[facility]   [address] °[place], [place] (336) 349-4454   °[facility] [address], [place], [place] (336) 342-8316  Insurance/Medicaid/sponsorship through [facility]  °[facility] [address] [place]                                    [place], [place] (336) 342-8316 Therapy/tele-psych/case  °[facility] [address]  ° [place], [place] (336) 349-2233    °Dr. Arfeen  (336) 349-4544   °[facility] [place]  [employer] [place] [facility] 1) [address], [place] °2) [address], [place] °3)  [address], [place] (336) 349-3220 °(336) 342-7768 ° °(336) 342-8140   °[place] [facility] (336) 342-1394 or (336) 342-3537 (After Hours)    ° ° ° °

## 2013-10-09 NOTE — ED Notes (Signed)
Pt reports left side upper dental pain and broken tooth. Airway intact.

## 2013-10-09 NOTE — ED Provider Notes (Signed)
Medical screening examination/treatment/procedure(s) were performed by non-physician practitioner and as supervising physician I was immediately available for consultation/collaboration.  Flint MelterElliott L Kaiyana Bedore, MD 10/09/13 2325

## 2013-12-11 ENCOUNTER — Emergency Department (HOSPITAL_COMMUNITY)
Admission: EM | Admit: 2013-12-11 | Discharge: 2013-12-11 | Disposition: A | Discharge status: Home / Self Care | Payer: Medicaid Other | Attending: Emergency Medicine | Admitting: Emergency Medicine

## 2013-12-11 ENCOUNTER — Encounter (HOSPITAL_COMMUNITY): Payer: Self-pay | Admitting: Emergency Medicine

## 2013-12-11 DIAGNOSIS — R197 Diarrhea, unspecified: Secondary | ICD-10-CM

## 2013-12-11 DIAGNOSIS — Z3202 Encounter for pregnancy test, result negative: Secondary | ICD-10-CM | POA: Insufficient documentation

## 2013-12-11 DIAGNOSIS — F411 Generalized anxiety disorder: Secondary | ICD-10-CM | POA: Insufficient documentation

## 2013-12-11 DIAGNOSIS — R109 Unspecified abdominal pain: Secondary | ICD-10-CM | POA: Insufficient documentation

## 2013-12-11 DIAGNOSIS — F17200 Nicotine dependence, unspecified, uncomplicated: Secondary | ICD-10-CM | POA: Insufficient documentation

## 2013-12-11 DIAGNOSIS — Z79899 Other long term (current) drug therapy: Secondary | ICD-10-CM | POA: Insufficient documentation

## 2013-12-11 DIAGNOSIS — R319 Hematuria, unspecified: Secondary | ICD-10-CM | POA: Insufficient documentation

## 2013-12-11 LAB — CBC WITH DIFFERENTIAL/PLATELET
Basophils Absolute: 0 10*3/uL (ref 0.0–0.1)
Basophils Relative: 1 % (ref 0–1)
EOS ABS: 0.1 10*3/uL (ref 0.0–0.7)
Eosinophils Relative: 2 % (ref 0–5)
HCT: 41.6 % (ref 36.0–46.0)
Hemoglobin: 14 g/dL (ref 12.0–15.0)
LYMPHS ABS: 1.4 10*3/uL (ref 0.7–4.0)
Lymphocytes Relative: 27 % (ref 12–46)
MCH: 30.5 pg (ref 26.0–34.0)
MCHC: 33.7 g/dL (ref 30.0–36.0)
MCV: 90.6 fL (ref 78.0–100.0)
MONOS PCT: 7 % (ref 3–12)
Monocytes Absolute: 0.3 10*3/uL (ref 0.1–1.0)
NEUTROS ABS: 3.2 10*3/uL (ref 1.7–7.7)
NEUTROS PCT: 63 % (ref 43–77)
PLATELETS: 264 10*3/uL (ref 150–400)
RBC: 4.59 MIL/uL (ref 3.87–5.11)
RDW: 14.3 % (ref 11.5–15.5)
WBC: 5.1 10*3/uL (ref 4.0–10.5)

## 2013-12-11 LAB — URINE MICROSCOPIC-ADD ON

## 2013-12-11 LAB — COMPREHENSIVE METABOLIC PANEL
ALBUMIN: 3.7 g/dL (ref 3.5–5.2)
ALT: 10 U/L (ref 0–35)
AST: 14 U/L (ref 0–37)
Alkaline Phosphatase: 87 U/L (ref 39–117)
BUN: 9 mg/dL (ref 6–23)
CHLORIDE: 104 meq/L (ref 96–112)
CO2: 22 mEq/L (ref 19–32)
Calcium: 9.5 mg/dL (ref 8.4–10.5)
Creatinine, Ser: 0.58 mg/dL (ref 0.50–1.10)
GFR calc non Af Amer: >90 mL/min (ref >90)
GLUCOSE: 121 mg/dL — AB (ref 70–99)
Potassium: 3.6 mEq/L — ABNORMAL LOW (ref 3.7–5.3)
Sodium: 140 mEq/L (ref 137–147)
TOTAL PROTEIN: 7.7 g/dL (ref 6.0–8.3)
Total Bilirubin: <0.2 mg/dL — ABNORMAL LOW (ref 0.3–1.2)

## 2013-12-11 LAB — URINALYSIS, ROUTINE W REFLEX MICROSCOPIC
Bilirubin Urine: NEGATIVE
Glucose, UA: NEGATIVE mg/dL
KETONES UR: 15 mg/dL — AB
Leukocytes, UA: NEGATIVE
Nitrite: NEGATIVE
PH: 6 (ref 5.0–8.0)
Protein, ur: 30 mg/dL — AB
SPECIFIC GRAVITY, URINE: 1.033 — AB (ref 1.005–1.030)
Urobilinogen, UA: 0.2 mg/dL (ref 0.0–1.0)

## 2013-12-11 LAB — POC URINE PREG, ED: PREG TEST UR: NEGATIVE

## 2013-12-11 MED ORDER — DICYCLOMINE HCL 20 MG PO TABS: 20.0000 mg | ORAL_TABLET | Freq: Two times a day (BID) | ORAL | Status: DC

## 2013-12-11 MED ORDER — LOPERAMIDE HCL 2 MG PO CAPS
2.0000 mg | ORAL_CAPSULE | Freq: Once | ORAL | Status: AC
  Administered 2013-12-11: 2 mg via ORAL
  Filled 2013-12-11: qty 1

## 2013-12-11 MED ORDER — LOPERAMIDE HCL 2 MG PO CAPS: 2.0000 mg | ORAL_CAPSULE | Freq: Four times a day (QID) | ORAL | Status: DC | PRN

## 2013-12-11 MED ORDER — OXYCODONE-ACETAMINOPHEN 5-325 MG PO TABS
1.0000 | ORAL_TABLET | Freq: Once | ORAL | Status: AC
  Administered 2013-12-11: 1 via ORAL
  Filled 2013-12-11: qty 1

## 2013-12-11 MED ORDER — DICYCLOMINE HCL 10 MG PO CAPS
10.0000 mg | ORAL_CAPSULE | Freq: Once | ORAL | Status: AC
  Administered 2013-12-11: 10 mg via ORAL
  Filled 2013-12-11: qty 1

## 2013-12-11 MED ORDER — HYDROCODONE-ACETAMINOPHEN 5-325 MG PO TABS: 1.0000 | ORAL_TABLET | Freq: Four times a day (QID) | ORAL | Status: DC | PRN

## 2013-12-11 NOTE — Discharge Instructions (Signed)
Abdominal Pain, Adult °Many things can cause abdominal pain. Usually, abdominal pain is not caused by a disease and will improve without treatment. It can often be observed and treated at home. Your health care provider will do a physical exam and possibly order blood tests and X-rays to help determine the seriousness of your pain. However, in many cases, more time must pass before a clear cause of the pain can be found. Before that point, your health care provider may not know if you need more testing or further treatment. °HOME CARE INSTRUCTIONS  °Monitor your abdominal pain for any changes. The following actions may help to alleviate any discomfort you are experiencing: °· Only take over-the-counter or prescription medicines as directed by your health care provider. °· Do not take laxatives unless directed to do so by your health care provider. °· Try a clear liquid diet (broth, tea, or water) as directed by your health care provider. Slowly move to a bland diet as tolerated. °SEEK MEDICAL CARE IF: °· You have unexplained abdominal pain. °· You have abdominal pain associated with nausea or diarrhea. °· You have pain when you urinate or have a bowel movement. °· You experience abdominal pain that wakes you in the night. °· You have abdominal pain that is worsened or improved by eating food. °· You have abdominal pain that is worsened with eating fatty foods. °SEEK IMMEDIATE MEDICAL CARE IF:  °· Your pain does not go away within 2 hours. °· You have a fever. °· You keep throwing up (vomiting). °· Your pain is felt only in portions of the abdomen, such as the right side or the left lower portion of the abdomen. °· You pass bloody or black tarry stools. °MAKE SURE YOU: °· Understand these instructions.   °· Will watch your condition.   °· Will get help right away if you are not doing well or get worse.   °Document Released: 06/09/2005 Document Revised: 06/20/2013 Document Reviewed: 05/09/2013 °ExitCare® Patient  Information ©2014 [employer]. °Diarrhea °Diarrhea is frequent loose and watery bowel movements. It can cause you to feel weak and dehydrated. Dehydration can cause you to become tired and thirsty, have a dry mouth, and have decreased urination that often is dark yellow. Diarrhea is a sign of another problem, most often an infection that will not last long. In most cases, diarrhea typically lasts 2 3 days. However, it can last longer if it is a sign of something more serious. It is important to treat your diarrhea as directed by your caregive to lessen or prevent future episodes of diarrhea. °CAUSES  °Some common causes include: °· Gastrointestinal infections caused by viruses, bacteria, or parasites. °· Food poisoning or food allergies. °· Certain medicines, such as antibiotics, chemotherapy, and laxatives. °· Artificial sweeteners and fructose. °· Digestive disorders. °HOME CARE INSTRUCTIONS °· Ensure adequate fluid intake (hydration): have 1 cup (8 oz) of fluid for each diarrhea episode. Avoid fluids that contain simple sugars or sports drinks, fruit juices, whole milk products, and sodas. Your urine should be clear or pale yellow if you are drinking enough fluids. Hydrate with an oral rehydration solution that you can purchase at pharmacies, retail stores, and online. You can prepare an oral rehydration solution at home by mixing the following ingredients together: °·   tsp table salt. °· ¾ tsp baking soda. °·  tsp salt substitute containing potassium chloride. °· 1  tablespoons sugar. °· 1 L (34 oz) of water. °· Certain foods and beverages may increase the speed   at which food moves through the gastrointestinal (GI) tract. These foods and beverages should be avoided and include: °· Caffeinated and alcoholic beverages. °· High-fiber foods, such as raw fruits and vegetables, nuts, seeds, and whole grain breads and cereals. °· Foods and beverages sweetened with sugar alcohols, such as xylitol, sorbitol, and  mannitol. °· Some foods may be well tolerated and may help thicken stool including: °· Starchy foods, such as rice, toast, pasta, low-sugar cereal, oatmeal, grits, baked potatoes, crackers, and bagels. °· Bananas. °· Applesauce. °· Add probiotic-rich foods to help increase healthy bacteria in the GI tract, such as yogurt and fermented milk products. °· Wash your hands well after each diarrhea episode. °· Only take over-the-counter or prescription medicines as directed by your caregiver. °· Take a warm bath to relieve any burning or pain from frequent diarrhea episodes. °SEEK IMMEDIATE MEDICAL CARE IF:  °· You are unable to keep fluids down. °· You have persistent vomiting. °· You have blood in your stool, or your stools are black and tarry. °· You do not urinate in 6 8 hours, or there is only a small amount of very dark urine. °· You have abdominal pain that increases or localizes. °· You have weakness, dizziness, confusion, or lightheadedness. °· You have a severe headache. °· Your diarrhea gets worse or does not get better. °· You have a fever or persistent symptoms for more than 2 3 days. °· You have a fever and your symptoms suddenly get worse. °MAKE SURE YOU:  °· Understand these instructions. °· Will watch your condition. °· Will get help right away if you are not doing well or get worse. °Document Released: 08/20/2002 Document Revised: 08/16/2012 Document Reviewed: 05/07/2012 °ExitCare® Patient Information ©2014 [employer]. ° °

## 2013-12-11 NOTE — ED Notes (Signed)
States she is feeling better 

## 2013-12-11 NOTE — ED Notes (Addendum)
Multiple complaints. Having back pain, cold symptoms x 1 week. Having sharp abd pain and diarrhea for several days. Denies vomiiting. No acute distress noted at triage. Also reports severe abd cramps from being on her menstrual cycle, [facility].

## 2013-12-11 NOTE — ED Provider Notes (Signed)
CSN: [id number]     Arrival date & time 12/11/13  1718 History   First MD Initiated Contact with Patient 12/11/13 2159     Chief Complaint  Patient presents with  . Abdominal Pain  . Diarrhea     (Consider location/radiation/quality/duration/timing/severity/associated sxs/prior Treatment) Patient is a 28 y.o. female presenting with abdominal pain and diarrhea. The history is provided by the patient. No language interpreter was used.  Abdominal Pain Pain location:  Suprapubic Pain quality: cramping and gnawing   Pain radiates to:  Does not radiate Pain severity:  Moderate Onset quality:  Gradual Duration:  3 days Timing:  Intermittent Progression:  Waxing and waning Chronicity:  New Associated symptoms: diarrhea and hematuria   Associated symptoms: no chills, no fever, no hematemesis and no hematochezia   Diarrhea Quality:  Mucous and watery Severity:  Moderate Onset quality:  Sudden Number of episodes:  5 today Duration:  1 day Timing:  Intermittent Progression:  Improving Worsened by:  Liquids Associated symptoms: abdominal pain   Associated symptoms: no chills and no fever   Risk factors: no sick contacts, no suspicious food intake and no travel to endemic areas     Past Medical History  Diagnosis Date  . Anxiety   . PIH (pregnancy induced hypertension)   . Bronchitis   . Seasonal allergies   . Pregnancy induced hypertension   . Seizure     pseudoseizures; stress related  . Pyelonephritis    Past Surgical History  Procedure Laterality Date  . Abortions    . Dilation and curettage of uterus      ? retained POC    Family History  Problem Relation Age of Onset  . Hypertension Mother   . Diabetes Maternal Aunt   . Hypertension Maternal Grandmother   . Diabetes Maternal Grandmother   . Heart disease Maternal Grandmother   . Hypertension Maternal Grandfather   . Heart disease Maternal Grandfather   . Anesthesia problems Neg Hx   . Hypotension Neg Hx   .  Malignant hyperthermia Neg Hx   . Pseudochol deficiency Neg Hx   . Other Neg Hx   . Hearing loss Neg Hx    History  Substance Use Topics  . Smoking status: Current Some Day Smoker -- 7 years    Types: Cigarettes  . Smokeless tobacco: Never Used  . Alcohol Use: No   OB History   Grav Para Term Preterm Abortions TAB SAB Ect Mult Living   7 4 4  0 3 3 0 0 0 4     Review of Systems  Constitutional: Negative for fever and chills.  Gastrointestinal: Positive for abdominal pain and diarrhea. Negative for hematochezia and hematemesis.  Genitourinary: Positive for hematuria.  All other systems reviewed and are negative.      Allergies  Bactrim and Ibuprofen  Home Medications   Current Outpatient Rx  Name  Route  Sig  Dispense  Refill  . albuterol (PROVENTIL HFA;VENTOLIN HFA) 108 (90 BASE) MCG/ACT inhaler   Inhalation   Inhale 2 puffs into the lungs every 6 (six) hours as needed. For shortness of breath   1 Inhaler   2   . aspirin 81 MG chewable tablet   Oral   Chew 162 mg by mouth once.          BP 116/83  Pulse 70  Temp(Src) 98.8 F (37.1 C) (Oral)  Resp 18  SpO2 100%  LMP 12/10/2013 Physical Exam  Nursing note and vitals reviewed.  Constitutional: She is oriented to person, place, and time. She appears well-developed and well-nourished.  HENT:  Head: Normocephalic.  Eyes: Pupils are equal, round, and reactive to light.  Neck: Normal range of motion.  Cardiovascular: Normal rate and regular rhythm.   Pulmonary/Chest: Effort normal and breath sounds normal.  Abdominal: Soft. Bowel sounds are normal. There is tenderness. There is no rebound and no guarding.  Musculoskeletal: Normal range of motion.  Neurological: She is alert and oriented to person, place, and time.  Skin: Skin is warm and dry.  Psychiatric: She has a normal mood and affect. Her behavior is normal. Judgment and thought content normal.    ED Course  Procedures (including critical care  time) Labs Review Labs Reviewed  COMPREHENSIVE METABOLIC PANEL - Abnormal; Notable for the following:    Potassium 3.6 (*)    Glucose, Bld 121 (*)    Total Bilirubin <0.2 (*)    All other components within normal limits  URINALYSIS, ROUTINE W REFLEX MICROSCOPIC - Abnormal; Notable for the following:    APPearance CLOUDY (*)    Specific Gravity, Urine 1.033 (*)    Hgb urine dipstick LARGE (*)    Ketones, ur 15 (*)    Protein, ur 30 (*)    All other components within normal limits  URINE MICROSCOPIC-ADD ON - Abnormal; Notable for the following:    Squamous Epithelial / LPF FEW (*)    Bacteria, UA FEW (*)    All other components within normal limits  CBC WITH DIFFERENTIAL  POC URINE PREG, ED   Imaging Review No results found.   EKG Interpretation None     Diarrhea and abdominal cramping x 2 days.  Denies fever,chills.  No leukocytosis. Pregnancy test negative.  Blood in urine likely from current menstruation.  Pain improved after percocet.  Benign abdominal exam.  Rx for bentyl, imodium.  Return precautions discussed.  Follow-up with PCP if no improvement over next few days. MDM   Final diagnoses:  None    Diarrhea. Abdominal cramping.    Jimmye Norman, NP 12/12/13 [id number]

## 2013-12-11 NOTE — ED Notes (Signed)
Patient currently on menstrual period 

## 2013-12-12 NOTE — ED Provider Notes (Signed)
Medical screening examination/treatment/procedure(s) were performed by non-physician practitioner and as supervising physician I was immediately available for consultation/collaboration.   EKG Interpretation None        Audree CamelScott T Daci Stubbe, MD 12/12/13 1413

## 2014-03-05 ENCOUNTER — Ambulatory Visit: Payer: Medicaid Other | Admitting: Family Medicine

## 2014-03-14 ENCOUNTER — Ambulatory Visit: Payer: Medicaid Other | Admitting: Family Medicine

## 2014-03-21 ENCOUNTER — Inpatient Hospital Stay (HOSPITAL_COMMUNITY)
Admission: EM | Admit: 2014-03-21 | Discharge: 2014-03-23 | DRG: 872 | Disposition: A | Discharge status: Home / Self Care | Payer: Self-pay | Attending: Family Medicine | Admitting: Family Medicine

## 2014-03-21 ENCOUNTER — Emergency Department (HOSPITAL_COMMUNITY): Payer: Medicaid Other

## 2014-03-21 ENCOUNTER — Encounter (HOSPITAL_COMMUNITY): Payer: Self-pay | Admitting: Emergency Medicine

## 2014-03-21 DIAGNOSIS — R569 Unspecified convulsions: Secondary | ICD-10-CM

## 2014-03-21 DIAGNOSIS — K5900 Constipation, unspecified: Secondary | ICD-10-CM | POA: Diagnosis present

## 2014-03-21 DIAGNOSIS — Z881 Allergy status to other antibiotic agents status: Secondary | ICD-10-CM

## 2014-03-21 DIAGNOSIS — Z833 Family history of diabetes mellitus: Secondary | ICD-10-CM

## 2014-03-21 DIAGNOSIS — F17200 Nicotine dependence, unspecified, uncomplicated: Secondary | ICD-10-CM | POA: Diagnosis present

## 2014-03-21 DIAGNOSIS — A419 Sepsis, unspecified organism: Principal | ICD-10-CM | POA: Diagnosis present

## 2014-03-21 DIAGNOSIS — Z886 Allergy status to analgesic agent status: Secondary | ICD-10-CM

## 2014-03-21 DIAGNOSIS — Z8249 Family history of ischemic heart disease and other diseases of the circulatory system: Secondary | ICD-10-CM

## 2014-03-21 DIAGNOSIS — A498 Other bacterial infections of unspecified site: Secondary | ICD-10-CM | POA: Diagnosis present

## 2014-03-21 DIAGNOSIS — R000 Tachycardia, unspecified: Secondary | ICD-10-CM | POA: Diagnosis present

## 2014-03-21 DIAGNOSIS — N12 Tubulo-interstitial nephritis, not specified as acute or chronic: Secondary | ICD-10-CM | POA: Diagnosis present

## 2014-03-21 LAB — CBC WITH DIFFERENTIAL/PLATELET
Basophils Absolute: 0 10*3/uL (ref 0.0–0.1)
Basophils Relative: 0 % (ref 0–1)
Eosinophils Absolute: 0 10*3/uL (ref 0.0–0.7)
Eosinophils Relative: 1 % (ref 0–5)
HCT: 43 % (ref 36.0–46.0)
Hemoglobin: 14.2 g/dL (ref 12.0–15.0)
LYMPHS PCT: 21 % (ref 12–46)
Lymphs Abs: 1.7 10*3/uL (ref 0.7–4.0)
MCH: 30.9 pg (ref 26.0–34.0)
MCHC: 33 g/dL (ref 30.0–36.0)
MCV: 93.7 fL (ref 78.0–100.0)
Monocytes Absolute: 0.2 10*3/uL (ref 0.1–1.0)
Monocytes Relative: 2 % — ABNORMAL LOW (ref 3–12)
NEUTROS PCT: 76 % (ref 43–77)
Neutro Abs: 6.2 10*3/uL (ref 1.7–7.7)
PLATELETS: 260 10*3/uL (ref 150–400)
RBC: 4.59 MIL/uL (ref 3.87–5.11)
RDW: 13.2 % (ref 11.5–15.5)
WBC: 8.2 10*3/uL (ref 4.0–10.5)

## 2014-03-21 LAB — POC URINE PREG, ED: PREG TEST UR: NEGATIVE

## 2014-03-21 LAB — BASIC METABOLIC PANEL
Anion gap: 13 (ref 5–15)
BUN: 11 mg/dL (ref 6–23)
CO2: 26 mEq/L (ref 19–32)
Calcium: 9.7 mg/dL (ref 8.4–10.5)
Chloride: 101 mEq/L (ref 96–112)
Creatinine, Ser: 0.84 mg/dL (ref 0.50–1.10)
GFR calc Af Amer: >90 mL/min (ref >90)
GFR calc non Af Amer: >90 mL/min (ref >90)
Glucose, Bld: 98 mg/dL (ref 70–99)
POTASSIUM: 3.7 meq/L (ref 3.7–5.3)
SODIUM: 140 meq/L (ref 137–147)

## 2014-03-21 MED ORDER — SODIUM CHLORIDE 0.9 % IV SOLN
Freq: Once | INTRAVENOUS | Status: AC
  Administered 2014-03-21: 23:00:00 via INTRAVENOUS

## 2014-03-21 MED ORDER — ACETAMINOPHEN 325 MG PO TABS
650.0000 mg | ORAL_TABLET | Freq: Once | ORAL | Status: AC
  Administered 2014-03-21: 650 mg via ORAL
  Filled 2014-03-21: qty 2

## 2014-03-21 MED ORDER — IOHEXOL 300 MG/ML  SOLN
50.0000 mL | Freq: Once | INTRAMUSCULAR | Status: AC | PRN
  Administered 2014-03-21: 50 mL via ORAL

## 2014-03-21 MED ORDER — SODIUM CHLORIDE 0.9 % IV BOLUS (SEPSIS)
2000.0000 mL | Freq: Once | INTRAVENOUS | Status: AC
  Administered 2014-03-22: 1000 mL via INTRAVENOUS

## 2014-03-21 NOTE — ED Notes (Signed)
Pt arrives via [facility] complaining of UTI sx x 2 weeks that have progressed to flank pain; pt c/o foul smelling urine and frequency; pt states that she has been unable to see and MD due to no chid care

## 2014-03-22 ENCOUNTER — Encounter (HOSPITAL_COMMUNITY): Payer: Self-pay

## 2014-03-22 DIAGNOSIS — N12 Tubulo-interstitial nephritis, not specified as acute or chronic: Secondary | ICD-10-CM | POA: Diagnosis present

## 2014-03-22 LAB — URINALYSIS, ROUTINE W REFLEX MICROSCOPIC
BILIRUBIN URINE: NEGATIVE
Glucose, UA: NEGATIVE mg/dL
Ketones, ur: NEGATIVE mg/dL
NITRITE: POSITIVE — AB
PH: 6 (ref 5.0–8.0)
Protein, ur: NEGATIVE mg/dL
SPECIFIC GRAVITY, URINE: 1.012 (ref 1.005–1.030)
UROBILINOGEN UA: 0.2 mg/dL (ref 0.0–1.0)

## 2014-03-22 LAB — CBC WITH DIFFERENTIAL/PLATELET
BASOS ABS: 0 10*3/uL (ref 0.0–0.1)
Basophils Relative: 0 % (ref 0–1)
EOS ABS: 0 10*3/uL (ref 0.0–0.7)
Eosinophils Relative: 0 % (ref 0–5)
HEMATOCRIT: 34.8 % — AB (ref 36.0–46.0)
Hemoglobin: 11.3 g/dL — ABNORMAL LOW (ref 12.0–15.0)
Lymphocytes Relative: 18 % (ref 12–46)
Lymphs Abs: 2.2 10*3/uL (ref 0.7–4.0)
MCH: 30.5 pg (ref 26.0–34.0)
MCHC: 32.5 g/dL (ref 30.0–36.0)
MCV: 93.8 fL (ref 78.0–100.0)
MONO ABS: 0.8 10*3/uL (ref 0.1–1.0)
Monocytes Relative: 6 % (ref 3–12)
Neutro Abs: 9.3 10*3/uL — ABNORMAL HIGH (ref 1.7–7.7)
Neutrophils Relative %: 76 % (ref 43–77)
PLATELETS: 236 10*3/uL (ref 150–400)
RBC: 3.71 MIL/uL — ABNORMAL LOW (ref 3.87–5.11)
RDW: 13.3 % (ref 11.5–15.5)
WBC: 12.3 10*3/uL — ABNORMAL HIGH (ref 4.0–10.5)

## 2014-03-22 LAB — URINE MICROSCOPIC-ADD ON

## 2014-03-22 LAB — BASIC METABOLIC PANEL
Anion gap: 15 (ref 5–15)
BUN: 6 mg/dL (ref 6–23)
CO2: 22 meq/L (ref 19–32)
CREATININE: 0.68 mg/dL (ref 0.50–1.10)
Calcium: 8.3 mg/dL — ABNORMAL LOW (ref 8.4–10.5)
Chloride: 105 mEq/L (ref 96–112)
GFR calc Af Amer: >90 mL/min (ref >90)
GLUCOSE: 131 mg/dL — AB (ref 70–99)
Potassium: 4.1 mEq/L (ref 3.7–5.3)
Sodium: 142 mEq/L (ref 137–147)

## 2014-03-22 MED ORDER — DIPHENHYDRAMINE HCL 50 MG/ML IJ SOLN
INTRAMUSCULAR | Status: AC
  Administered 2014-03-22: 25 mg
  Filled 2014-03-22: qty 1

## 2014-03-22 MED ORDER — POLYETHYLENE GLYCOL 3350 17 G PO PACK
17.0000 g | PACK | Freq: Every day | ORAL | Status: DC
  Administered 2014-03-22: 17 g via ORAL
  Filled 2014-03-22 (×2): qty 1

## 2014-03-22 MED ORDER — IOHEXOL 300 MG/ML  SOLN
100.0000 mL | Freq: Once | INTRAMUSCULAR | Status: AC | PRN
  Administered 2014-03-22: 100 mL via INTRAVENOUS

## 2014-03-22 MED ORDER — KETOROLAC TROMETHAMINE 30 MG/ML IJ SOLN
30.0000 mg | Freq: Once | INTRAMUSCULAR | Status: AC
  Administered 2014-03-22: 30 mg via INTRAVENOUS

## 2014-03-22 MED ORDER — OXYCODONE-ACETAMINOPHEN 5-325 MG PO TABS
1.0000 | ORAL_TABLET | Freq: Once | ORAL | Status: AC
  Administered 2014-03-22: 1 via ORAL
  Filled 2014-03-22: qty 1

## 2014-03-22 MED ORDER — DEXTROSE 5 % IV SOLN: 1.0000 g | INTRAVENOUS | Status: DC

## 2014-03-22 MED ORDER — ONDANSETRON HCL 4 MG/2ML IJ SOLN: 4.0000 mg | Freq: Four times a day (QID) | INTRAMUSCULAR | Status: DC | PRN

## 2014-03-22 MED ORDER — IBUPROFEN 200 MG PO TABS
600.0000 mg | ORAL_TABLET | Freq: Once | ORAL | Status: DC
  Filled 2014-03-22: qty 3

## 2014-03-22 MED ORDER — TRAMADOL-ACETAMINOPHEN 37.5-325 MG PO TABS
1.0000 | ORAL_TABLET | Freq: Once | ORAL | Status: AC
  Administered 2014-03-22: 1 via ORAL
  Filled 2014-03-22: qty 1

## 2014-03-22 MED ORDER — HEPARIN SODIUM (PORCINE) 5000 UNIT/ML IJ SOLN
5000.0000 [IU] | Freq: Three times a day (TID) | INTRAMUSCULAR | Status: DC
  Filled 2014-03-22 (×7): qty 1

## 2014-03-22 MED ORDER — ONDANSETRON HCL 4 MG PO TABS: 4.0000 mg | ORAL_TABLET | Freq: Four times a day (QID) | ORAL | Status: DC | PRN

## 2014-03-22 MED ORDER — CIPROFLOXACIN IN D5W 400 MG/200ML IV SOLN
400.0000 mg | Freq: Once | INTRAVENOUS | Status: AC
  Administered 2014-03-22: 400 mg via INTRAVENOUS
  Filled 2014-03-22: qty 200

## 2014-03-22 MED ORDER — MORPHINE SULFATE 2 MG/ML IJ SOLN
1.0000 mg | Freq: Once | INTRAMUSCULAR | Status: DC
  Filled 2014-03-22: qty 1

## 2014-03-22 MED ORDER — KETOROLAC TROMETHAMINE 30 MG/ML IJ SOLN
30.0000 mg | Freq: Four times a day (QID) | INTRAMUSCULAR | Status: DC | PRN
  Administered 2014-03-22: 30 mg via INTRAVENOUS
  Filled 2014-03-22: qty 1
  Filled 2014-03-22: qty 2

## 2014-03-22 MED ORDER — DEXTROSE 5 % IV SOLN
1.0000 g | INTRAVENOUS | Status: DC
  Administered 2014-03-22 – 2014-03-23 (×2): 1 g via INTRAVENOUS
  Filled 2014-03-22 (×2): qty 10

## 2014-03-22 MED ORDER — ACETAMINOPHEN 650 MG RE SUPP: 650.0000 mg | Freq: Four times a day (QID) | RECTAL | Status: DC | PRN

## 2014-03-22 MED ORDER — ACETAMINOPHEN 325 MG PO TABS
650.0000 mg | ORAL_TABLET | Freq: Four times a day (QID) | ORAL | Status: DC | PRN
  Administered 2014-03-22 – 2014-03-23 (×3): 650 mg via ORAL
  Filled 2014-03-22 (×3): qty 2

## 2014-03-22 MED ORDER — SODIUM CHLORIDE 0.9 % IV SOLN
INTRAVENOUS | Status: DC
  Administered 2014-03-22 (×2): via INTRAVENOUS

## 2014-03-22 MED ORDER — KETOROLAC TROMETHAMINE 30 MG/ML IJ SOLN
INTRAMUSCULAR | Status: AC
  Filled 2014-03-22: qty 1

## 2014-03-22 MED ORDER — ACETAMINOPHEN 325 MG PO TABS
325.0000 mg | ORAL_TABLET | Freq: Once | ORAL | Status: AC
  Administered 2014-03-22: 325 mg via ORAL
  Filled 2014-03-22: qty 1

## 2014-03-22 MED ORDER — DEXTROSE 5 % IV SOLN
1.0000 g | INTRAVENOUS | Status: DC
  Filled 2014-03-22: qty 10

## 2014-03-22 NOTE — Progress Notes (Signed)
[facility] to 2w02 via [facility]  Pt A&Ox4, no distress at time.  Pt oriented to room and unit.  MD on call paged to notify of pts arrival.  VSS.  Call bell within reach. Will continue to monitor pt closely.

## 2014-03-22 NOTE — H&P (Signed)
Family Medicine [facility] History and Physical Service Pager: [phone]  Patient name: Kristi CromeBrittany S Scrima Medical record number: [id number] Date of birth: 10/19/1985 Age: 28 y.o. Gender: female  Primary Care Provider: Marikay AlarSonnenberg, Eric, MD Consultants: None Code Status: Full code  Chief Complaint: R flank pain, dysuria, urinary frequency  Assessment and Plan: Kristi Marshall is a 28 y.o. female presenting with R pyelonephritis. PMH is significant for recurrent UTIs (~3/year), pyelonephritis, and pseudoseizures.  #Sepsis 2/2 R Pyelonephritis: Meets 2/4 SIRS Criteria with fever and tachycardia, though [facility]  UA with small hgb, positive nitrites, small leukocytes, many bacteria on gram stain. Ct abd/pelvis consistent with R pyelonephritis, bladder wall thickening. Hx of multiple UTIs, most recent culture growing species e. Coli in June 2014 (resistant to amp and bactrim). Overall appears well clinically on exam currently. - f/u UCx - D/c Cipro and start Ceftriaxone 1g q24h - BCx x2 ordered - Ordered after administration of first dose of Cipro - mIVF: NS @ 125 mL/hr - tylenol 650mg  q4hr prn - Toradol 30mg  IV q6h prn for pain - zofran PRN  #Constipation: Large stool burden on CT, Miralax BID, Continue to monitor  #Hx of ?Seizure: not currently on medications - monitor clinically.   FEN/GI: Regular diet, NS @ 14225mL/hr Prophylaxis: Subq heparin  Disposition: Admit to floor for Obs, pending culture results and transition to PO Abx.  History of Present Illness: Kristi CromeBrittany S Klimaszewski is a 28 y.o. female presenting with R flank pain, dysuria, and urinary frequency.  PMH significant for recurrent UTIs (~3/year), pyelonephritis, and pseudoseizures.    Patient reports dysuria and urinary frequency x1.5 weeks, with progressive worsening of symptoms. Developed right flank pain over past several days to the point of needing to go to the ED.  She  reports that pain starts in R flank and radiates to back and occasionally to pelvis.  Nothing seems to make the pain better or worse. She reports having up to 3 UTIs a year, and one episode of pyelonephritis in 2013 which she compares to the current episode. Overall [facility] as compared to presentation in ED. Denies nausea/vomiting, CP, SOB, +constipation, no blood in urine, no LE swelling.  In ED: Fever 103.25F, tachycardic to 150s, received 2L NS, Toradol, Cipro x1  Review Of Systems: Per HPI with the following additions: Positive for constipation Otherwise 12 point review of systems was performed and was unremarkable.  Patient Active Problem List   Diagnosis Date Noted  . Pyelonephritis 03/22/2014  . Menorrhagia 07/31/2012  . Dysmenorrhea 07/31/2012  . Single episode of elevated blood pressure 07/31/2012  . Seizure-like activity 06/20/2012  . UNSPECIFIED VAGINITIS AND VULVOVAGINITIS 04/28/2010  . THYROMEGALY 12/17/2009  . ALLERGIC RHINITIS 12/17/2009  . TOBACCO USER 05/24/2009  . SEIZURE DISORDER 12/20/2008   Past Medical History: Past Medical History  Diagnosis Date  . Anxiety   . PIH (pregnancy induced hypertension)   . Bronchitis   . Seasonal allergies   . Pregnancy induced hypertension   . Seizure     pseudoseizures; stress related  . Pyelonephritis    Past Surgical History: Past Surgical History  Procedure Laterality Date  . Abortions    . Dilation and curettage of uterus      ? retained POC    Social History: History  Substance Use Topics  . Smoking status: Current Some Day Smoker -- 7 years    Types: Cigarettes  . Smokeless tobacco:  Never Used  . Alcohol Use: No   Additional social history: none  Please also refer to relevant sections of EMR.  Family History: Family History  Problem Relation Age of Onset  . Hypertension Mother   . Diabetes Maternal Aunt   . Hypertension Maternal Grandmother   . Diabetes Maternal  Grandmother   . Heart disease Maternal Grandmother   . Hypertension Maternal Grandfather   . Heart disease Maternal Grandfather   . Anesthesia problems Neg Hx   . Hypotension Neg Hx   . Malignant hyperthermia Neg Hx   . Pseudochol deficiency Neg Hx   . Other Neg Hx   . Hearing loss Neg Hx    Allergies and Medications: Allergies  Allergen Reactions  . Bactrim [Sulfamethoxazole-Trimethoprim] Nausea And Vomiting and Other (See Comments)    high fevers.   . Ibuprofen Itching   No current facility-administered medications on file prior to encounter.   Current Outpatient Prescriptions on File Prior to Encounter  Medication Sig Dispense Refill  . albuterol (PROVENTIL HFA;VENTOLIN HFA) 108 (90 BASE) MCG/ACT inhaler Inhale 2 puffs into the lungs every 6 (six) hours as needed. For shortness of breath  1 Inhaler  2    Objective: BP 128/75  Pulse 155  Temp(Src) 102.2 F (39 C) (Oral)  Resp 18  Ht 5\' 4"  (1.626 m)  SpO2 95%  LMP 03/14/2014 Exam: General: Young AAF in bed in NAD, nontoxic in appearance HEENT: NCAT, mildly dry MM, OP clear, PERRL, EOMI Cardiovascular: Tachycardic, no m/r/g Respiratory: CTAB, no w/r/c, normal WOB Abdomen: Soft, ND, TTP in RUQ and R flank, NABS, +CVA tenderness on R Extremities: No edema, no calf tenderness Skin: No rashes, Intact Neuro: alert & Oriented, strength 5/5 in b/l UEs and LEs  Labs and Imaging: CBC BMET   Recent Labs Lab 03/21/14 2235  WBC 8.2  HGB 14.2  HCT 43.0  PLT 260    Recent Labs Lab 03/21/14 2235  NA 140  K 3.7  CL 101  CO2 26  BUN 11  CREATININE 0.84  GLUCOSE 98  CALCIUM 9.7      UA (7/10): Neg Glucose/bili/ketones/protein, +nitrite, small leukocytes, 11-20 WBC, many bacteria  UPT: Negative  CT Abd/Pelvis (7/10):  Bladder wall thickening with suggestion of urothelial thickening on  the right hand hazy appearance to the right renal sinus probably  representing right-sided pyelonephritis and  cystitis.   Shirlee Latch, MD 03/22/2014, 1:42 AM PGY-1, [place] [facility] FPTS Intern pager: [phone], text pages welcome  I have seen the patient and agree with Dr. Penelope Coop excellent note above. My changes are noted in green.  Tawni Carnes, MD 03/22/2014, 4:04 AM PGY-2, [facility] Family Medicine FPTS Intern Pager: [phone], text pages welcome

## 2014-03-22 NOTE — ED Notes (Signed)
Report given to [facility] 

## 2014-03-22 NOTE — ED Provider Notes (Signed)
Medical screening examination/treatment/procedure(s) were performed by non-physician practitioner and as supervising physician I was immediately available for consultation/collaboration.   EKG Interpretation   Date/Time:  Thursday March 21 2014 22:30:15 EDT Ventricular Rate:  137 PR Interval:    QRS Duration: 80 QT Interval:  355 QTC Calculation: 536 R Axis:   63 Text Interpretation:  Sinus tachycardia RSR' in V1 or V2, right VCD or RVH  Nonspecific T abnormalities, lateral leads Prolonged QT interval Confirmed  by Norlene CampbellTTER  MD, Devani Odonnel ([id number]) on 03/22/2014 1:19:43 AM       Olivia Mackielga M Shyonna Carlin, MD 03/22/14 [id number]

## 2014-03-22 NOTE — ED Provider Notes (Signed)
CSN: [id number]     Arrival date & time 03/21/14  2129 History   First MD Initiated Contact with Patient 03/21/14 2245     No chief complaint on file.    (Consider location/radiation/quality/duration/timing/severity/associated sxs/prior Treatment) HPI Patient presents to the emergency department with 2 weeks worth of right flank pain with urinary frequency and malodorous urine.  The patient, states, that she's had urinary tract infections in the past with pyelonephritis.  The patient, states, that nothing seems make her condition, better or worse.  She, states she's had fever for the last 12 hours.  She denies chest pain, shortness of breath, nausea, vomiting, diarrhea, weakness, dizziness, rash, cough, or syncope.  The patient, states, that she did not take any medications prior to arrival.  Patient, states, that her symptoms have been persistent Past Medical History  Diagnosis Date  . Anxiety   . PIH (pregnancy induced hypertension)   . Bronchitis   . Seasonal allergies   . Pregnancy induced hypertension   . Seizure     pseudoseizures; stress related  . Pyelonephritis    Past Surgical History  Procedure Laterality Date  . Abortions    . Dilation and curettage of uterus      ? retained POC    Family History  Problem Relation Age of Onset  . Hypertension Mother   . Diabetes Maternal Aunt   . Hypertension Maternal Grandmother   . Diabetes Maternal Grandmother   . Heart disease Maternal Grandmother   . Hypertension Maternal Grandfather   . Heart disease Maternal Grandfather   . Anesthesia problems Neg Hx   . Hypotension Neg Hx   . Malignant hyperthermia Neg Hx   . Pseudochol deficiency Neg Hx   . Other Neg Hx   . Hearing loss Neg Hx    History  Substance Use Topics  . Smoking status: Current Some Day Smoker -- 7 years    Types: Cigarettes  . Smokeless tobacco: Never Used  . Alcohol Use: No   OB History   Grav Para Term Preterm Abortions TAB SAB Ect Mult Living   7 4 4   0 3 3 0 0 0 4     Review of Systems  All other systems negative except as documented in the HPI. All pertinent positives and negatives as reviewed in the HPI.  Allergies  Bactrim and Ibuprofen  Home Medications   Prior to Admission medications   Medication Sig Start Date End Date Taking? Authorizing Provider  acetaminophen (TYLENOL) 325 MG tablet Take 650 mg by mouth every 6 (six) hours as needed (pain).   Yes Historical Provider, MD  albuterol (PROVENTIL HFA;VENTOLIN HFA) 108 (90 BASE) MCG/ACT inhaler Inhale 2 puffs into the lungs every 6 (six) hours as needed. For shortness of breath 05/21/12  Yes Antionette Char, MD   BP 128/75  Pulse 155  Temp(Src) 102.2 F (39 C) (Oral)  Resp 18  Ht 5\' 4"  (1.626 m)  SpO2 95%  LMP 03/14/2014 Physical Exam  Nursing note and vitals reviewed. Constitutional: She is oriented to person, place, and time. She appears well-developed and well-nourished. No distress.  HENT:  Head: Normocephalic and atraumatic.  Mouth/Throat: Oropharynx is clear and moist.  Eyes: Pupils are equal, round, and reactive to light.  Neck: Normal range of motion. Neck supple.  Cardiovascular: Regular rhythm and normal heart sounds.  Tachycardia present.  Exam reveals no gallop and no friction rub.   No murmur heard. Pulmonary/Chest: Effort normal and breath sounds normal. No  respiratory distress. She has no wheezes.  Abdominal: Soft. Bowel sounds are normal. She exhibits no distension. There is tenderness. There is no rebound and no guarding.  Musculoskeletal: She exhibits no edema.  Neurological: She is alert and oriented to person, place, and time. She exhibits normal muscle tone. Coordination normal.  Skin: Skin is warm and dry. No rash noted. No erythema.    ED Course  Procedures (including critical care time) Labs Review Labs Reviewed  URINALYSIS, ROUTINE W REFLEX MICROSCOPIC - Abnormal; Notable for the following:    Hgb urine dipstick TRACE (*)    Nitrite  POSITIVE (*)    Leukocytes, UA SMALL (*)    All other components within normal limits  CBC WITH DIFFERENTIAL - Abnormal; Notable for the following:    Monocytes Relative 2 (*)    All other components within normal limits  URINE MICROSCOPIC-ADD ON - Abnormal; Notable for the following:    Bacteria, UA MANY (*)    All other components within normal limits  URINE CULTURE  BASIC METABOLIC PANEL  POC URINE PREG, ED    Imaging Review Ct Abdomen Pelvis W Contrast  03/22/2014   CLINICAL DATA:  Bilateral flank pain, mostly on the right. UTI symptoms for 2 weeks.  EXAM: CT ABDOMEN AND PELVIS WITH CONTRAST  TECHNIQUE: Multidetector CT imaging of the abdomen and pelvis was performed using the standard protocol following bolus administration of intravenous contrast.  CONTRAST:  50mL OMNIPAQUE IOHEXOL 300 MG/ML SOLN, 100mL OMNIPAQUE IOHEXOL 300 MG/ML SOLN  COMPARISON:  None.  FINDINGS: Lung bases are clear.  The liver, spleen, gallbladder, pancreas, adrenal glands, abdominal aorta, inferior vena cava, and retroperitoneal lymph nodes are unremarkable. Kidneys are symmetrical in size and shape and the nephrograms are homogeneous bilaterally but there appears to be mild hazy appearance to the renal sinus on the right with mild thickening and enhancement of the urothelium of the right renal pelvis and ureter. Bladder wall is thickened. These changes suggest cystitis with possible right pyelonephritis. No hydronephrosis or solid mass in the kidneys. The stomach, small bowel, and colon are not abnormally distended. Stool fills the colon. No free air or free fluid in the abdomen.  Pelvis: Small amount of free fluid in the pelvis is likely to be physiologic. Uterus and ovaries are not enlarged. No findings to suggest diverticulitis. The appendix is normal. No destructive bone lesions.  IMPRESSION: Bladder wall thickening with suggestion of urothelial thickening on the right hand hazy appearance to the right renal sinus  probably representing right-sided pyelonephritis and cystitis.   Electronically Signed   By: Burman NievesWilliam  Stevens M.D.   On: 03/22/2014 01:06    Patient be admitted to the hospital for further care and evaluation.  Patient continues to have fever and tachycardia despite IV fluids and antipyretics   Carlyle DollyChristopher W Maelyn Berrey, PA-C 03/22/14 0130

## 2014-03-22 NOTE — ED Notes (Signed)
Pt states ibuprofen causes itching but it's only mild.  Thayer Ohmhris EDPA notified and ordered toradol

## 2014-03-22 NOTE — H&P (Signed)
Seen and examined.  Chart reviewed.  Discussed with Dr. Waynetta SandyWight.  Agree with his documentation and management.  Briefly, 28 yo female with hx of frequent UTIs.  Came to ER after UTI sx for ~1 week then developed fever and Right flank pain.   No hx to suggest she is immunocompromised.  This morning, very sleepy.  States no pain and no nausea - just very tired. Imp: uncomplicated pyleonephritis.  Because of tachycardia and high fever, agree with admit.  Appears to be responding promptly to Rx.  Cultures pending.  No obstruction.  Potential prompt DC if tolerating PO and stable VS.

## 2014-03-22 NOTE — Progress Notes (Signed)
UR Completed Leyna Vanderkolk Graves-Bigelow, RN,BSN [phone]  

## 2014-03-22 NOTE — Care Management Note (Unsigned)
    Page 1 of 1   03/22/2014     3:57:35 PM CARE MANAGEMENT NOTE 03/22/2014  Patient:  Belva CromeBROOKS,Akyra S   Account Number:  [id number]  Date Initiated:  03/22/2014  Documentation initiated by:  Etha Stambaugh  Subjective/Objective Assessment:   Pt adm on 03/21/14 with recurrent pyelonephritis.  PTA, pt independent of ADLs.     Action/Plan:   Will follow for dc needs as pt progresses.   Anticipated DC Date:  03/23/2014   Anticipated DC Plan:  HOME/SELF CARE      DC Planning Services  CM consult      Choice offered to / List presented to:             Status of service:  In process, will continue to follow Medicare Important Message given?   (If response is "NO", the following Medicare IM given date fields will be blank) Date Medicare IM given:   Medicare IM given by:   Date Additional Medicare IM given:   Additional Medicare IM given by:    Discharge Disposition:    Per UR Regulation:  Reviewed for med. necessity/level of care/duration of stay  If discussed at Long Length of Stay Meetings, dates discussed:    Comments:

## 2014-03-23 DIAGNOSIS — R569 Unspecified convulsions: Secondary | ICD-10-CM

## 2014-03-23 DIAGNOSIS — K5900 Constipation, unspecified: Secondary | ICD-10-CM

## 2014-03-23 LAB — CBC WITH DIFFERENTIAL/PLATELET
BASOS ABS: 0 10*3/uL (ref 0.0–0.1)
BASOS PCT: 0 % (ref 0–1)
EOS PCT: 2 % (ref 0–5)
Eosinophils Absolute: 0.1 10*3/uL (ref 0.0–0.7)
HEMATOCRIT: 33.1 % — AB (ref 36.0–46.0)
Hemoglobin: 10.8 g/dL — ABNORMAL LOW (ref 12.0–15.0)
LYMPHS PCT: 41 % (ref 12–46)
Lymphs Abs: 2.7 10*3/uL (ref 0.7–4.0)
MCH: 30.6 pg (ref 26.0–34.0)
MCHC: 32.6 g/dL (ref 30.0–36.0)
MCV: 93.8 fL (ref 78.0–100.0)
MONO ABS: 0.6 10*3/uL (ref 0.1–1.0)
Monocytes Relative: 9 % (ref 3–12)
Neutro Abs: 3.1 10*3/uL (ref 1.7–7.7)
Neutrophils Relative %: 48 % (ref 43–77)
PLATELETS: 230 10*3/uL (ref 150–400)
RBC: 3.53 MIL/uL — ABNORMAL LOW (ref 3.87–5.11)
RDW: 13.5 % (ref 11.5–15.5)
WBC: 6.6 10*3/uL (ref 4.0–10.5)

## 2014-03-23 LAB — BASIC METABOLIC PANEL
ANION GAP: 11 (ref 5–15)
BUN: 8 mg/dL (ref 6–23)
CALCIUM: 8.4 mg/dL (ref 8.4–10.5)
CO2: 22 meq/L (ref 19–32)
Chloride: 108 mEq/L (ref 96–112)
Creatinine, Ser: 0.58 mg/dL (ref 0.50–1.10)
GFR calc Af Amer: >90 mL/min (ref >90)
GLUCOSE: 102 mg/dL — AB (ref 70–99)
Potassium: 4.4 mEq/L (ref 3.7–5.3)
SODIUM: 141 meq/L (ref 137–147)

## 2014-03-23 MED ORDER — CEPHALEXIN 500 MG PO CAPS: 500.0000 mg | ORAL_CAPSULE | Freq: Four times a day (QID) | ORAL | Status: DC

## 2014-03-23 NOTE — Discharge Instructions (Signed)
You were admitted for an infection in your kidney.  We are treating you with antibiotics.  Please take these as prescribed until they run out.  Call your doctor if you have fever > 100.4, severe pain, or cannot keep any food or liquids down.    Pyelonephritis, Adult Pyelonephritis is a kidney infection. In general, there are 2 main types of pyelonephritis:  Infections that come on quickly without any warning (acute pyelonephritis).  Infections that persist for a long period of time (chronic pyelonephritis). CAUSES  Two main causes of pyelonephritis are:  Bacteria traveling from the bladder to the kidney. This is a problem especially in pregnant women. The urine in the bladder can become filled with bacteria from multiple causes, including:  Inflammation of the prostate gland (prostatitis).  Sexual intercourse in females.  Bladder infection (cystitis).  Bacteria traveling from the bloodstream to the tissue part of the kidney. Problems that may increase your risk of getting a kidney infection include:  Diabetes.  Kidney stones or bladder stones.  Cancer.  Catheters placed in the bladder.  Other abnormalities of the kidney or ureter. SYMPTOMS   Abdominal pain.  Pain in the side or flank area.  Fever.  Chills.  Upset stomach.  Blood in the urine (dark urine).  Frequent urination.  Strong or persistent urge to urinate.  Burning or stinging when urinating. DIAGNOSIS  Your caregiver may diagnose your kidney infection based on your symptoms. A urine sample may also be taken. TREATMENT  In general, treatment depends on how severe the infection is.   If the infection is mild and caught early, your caregiver may treat you with oral antibiotics and send you home.  If the infection is more severe, the bacteria may have gotten into the bloodstream. This will require intravenous (IV) antibiotics and a hospital stay. Symptoms may include:  High fever.  Severe flank  pain.  Shaking chills.  Even after a hospital stay, your caregiver may require you to be on oral antibiotics for a period of time.  Other treatments may be required depending upon the cause of the infection. HOME CARE INSTRUCTIONS   Take your antibiotics as directed. Finish them even if you start to feel better.  Make an appointment to have your urine checked to make sure the infection is gone.  Drink enough fluids to keep your urine clear or pale yellow.  Take medicines for the bladder if you have urgency and frequency of urination as directed by your caregiver. SEEK IMMEDIATE MEDICAL CARE IF:   You have a fever or persistent symptoms for more than 2-3 days.  You have a fever and your symptoms suddenly get worse.  You are unable to take your antibiotics or fluids.  You develop shaking chills.  You experience extreme weakness or fainting.  There is no improvement after 2 days of treatment. MAKE SURE YOU:  Understand these instructions.  Will watch your condition.  Will get help right away if you are not doing well or get worse. Document Released: 08/30/2005 Document Revised: 02/29/2012 Document Reviewed: 02/03/2011 [facility] Patient Information 2015 [employer] [place]. This information is not intended to replace advice given to you by your health care provider. Make sure you discuss any questions you have with your health care provider.

## 2014-03-23 NOTE — Progress Notes (Signed)
Called MD about urine culture preliminary results.  MD ordered Keflex.  I called pharmacy and pt to make aware of need to take antibiotics.  Pt verbalized understanding of need and will follow up with MD Monday. Thomas HoffBurton, Krisa Blattner McClintock, RN

## 2014-03-23 NOTE — Progress Notes (Signed)
Pt given discharge instructions, medication lists, follow up appointments, and when to call the doctor.  Pt verbalizes understanding. Sonal Dorwart McClintock, RN  

## 2014-03-24 LAB — URINE CULTURE: Colony Count: >=100000

## 2014-03-25 NOTE — Discharge Summary (Signed)
Family Medicine [facility] Discharge Summary  Patient name: Kristi Marshall Medical record number: [id number] Date of birth: 1985-11-06 Age: 28 y.o. Gender: female Date of Admission: 03/21/2014  Date of Discharge: 03/23/2014 Admitting Physician: Uvaldo RisingKyle J Fletke, MD  Primary Care Provider: Marikay AlarSonnenberg, Eric, MD Consultants: None  Indication for Hospitalization: Right pyelonephritis  Discharge Diagnoses/Problem List:  Right pyelonephritis  Sepsis  Constipation   Disposition: Discharged home   Discharge Condition: Stable  Discharge Exam:  Filed Vitals:   03/23/14 0400  BP: 123/76  Pulse: 74  Temp: 98 F (36.7 C)  Resp: 20  General: Young [place] female in bed in NAD, nontoxic in appearance  HEENT: NCAT, MMM, OP clear, PERRL, EOMI  Cardiovascular: RRR, no m/r/g noted. 2+ radial and DP pulses bilaterally   Respiratory: CTAB, no w/r/c, normal WOB  Abdomen: Soft, ND, TTP in RUQ and R flank, NABS, +CVA tenderness on R  Extremities: No edema, no calf tenderness   Skin: No rashes, Intact  Neuro: alert & Oriented, strength 5/5 in b/l UEs and LEs    Brief Hospital Course:  Patient presented with tachycardia and a fever of 102.2 in the setting of right pyelonephritis. One dose of  Ceftriaxone 1g on admission. Pain controlled, patient able to tolerate PO intake, and discharged on Keflex.   Issues for Follow Up:  -Follow up urinary symptoms as pt has a h/o UTIs - Pt has a history of seizure per her record, not currently on medication. Monitor clinically. - Consider repeat EKG in the future: On admission Sinus tachycardia with a long QTc  Significant Procedures: None  Significant Labs and Imaging:   Recent Labs Lab 03/21/14 2235 03/22/14 0545 03/23/14 0406  WBC 8.2 12.3* 6.6  HGB 14.2 11.3* 10.8*  HCT 43.0 34.8* 33.1*  PLT 260 236 230    Recent Labs Lab 03/21/14 2235 03/22/14 0545 03/23/14 0406  NA 140 142 141  K 3.7 4.1 4.4  CL 101 105 108   CO2 26 22 22   GLUCOSE 98 131* 102*  BUN 11 6 8   CREATININE 0.84 0.68 0.58  CALCIUM 9.7 8.3* 8.4   Urinalysis    Component Value Date/Time   COLORURINE YELLOW 03/21/2014 2323   APPEARANCEUR CLEAR 03/21/2014 2323   LABSPEC 1.012 03/21/2014 2323   PHURINE 6.0 03/21/2014 2323   GLUCOSEU NEGATIVE 03/21/2014 2323   HGBUR TRACE* 03/21/2014 2323   BILIRUBINUR NEGATIVE 03/21/2014 2323   KETONESUR NEGATIVE 03/21/2014 2323   PROTEINUR NEGATIVE 03/21/2014 2323   UROBILINOGEN 0.2 03/21/2014 2323   NITRITE POSITIVE* 03/21/2014 2323   LEUKOCYTESUR SMALL* 03/21/2014 2323   Urine culture: E.coli that was pan-sensitive Blood cultures: NGTD  Ct Abdomen Pelvis W Contrast  03/22/2014   CLINICAL DATA:  Bilateral flank pain, mostly on the right. UTI symptoms for 2 weeks.  EXAM: CT ABDOMEN AND PELVIS WITH CONTRAST  TECHNIQUE: Multidetector CT imaging of the abdomen and pelvis was performed using the standard protocol following bolus administration of intravenous contrast.  CONTRAST:  50mL OMNIPAQUE IOHEXOL 300 MG/ML SOLN, 100mL OMNIPAQUE IOHEXOL 300 MG/ML SOLN  COMPARISON:  None.  FINDINGS: Lung bases are clear.  The liver, spleen, gallbladder, pancreas, adrenal glands, abdominal aorta, inferior vena cava, and retroperitoneal lymph nodes are unremarkable. Kidneys are symmetrical in size and shape and the nephrograms are homogeneous bilaterally but there appears to be mild hazy appearance to the renal sinus on the right with mild thickening and enhancement of the urothelium of the right renal pelvis and ureter.  Bladder wall is thickened. These changes suggest cystitis with possible right pyelonephritis. No hydronephrosis or solid mass in the kidneys. The stomach, small bowel, and colon are not abnormally distended. Stool fills the colon. No free air or free fluid in the abdomen.  Pelvis: Small amount of free fluid in the pelvis is likely to be physiologic. Uterus and ovaries are not enlarged. No findings to suggest diverticulitis. The  appendix is normal. No destructive bone lesions.  IMPRESSION: Bladder wall thickening with suggestion of urothelial thickening on the right hand hazy appearance to the right renal sinus probably representing right-sided pyelonephritis and cystitis.   Electronically Signed   By: Burman Nieves M.D.   On: 03/22/2014 01:06    Results/Tests Pending at Time of Discharge:  Final blood cultures   Discharge Medications:    Medication List         acetaminophen 325 MG tablet  Commonly known as:  TYLENOL  Take 650 mg by mouth every 6 (six) hours as needed (pain).     albuterol 108 (90 BASE) MCG/ACT inhaler  Commonly known as:  PROVENTIL HFA;VENTOLIN HFA  Inhale 2 puffs into the lungs every 6 (six) hours as needed. For shortness of breath     cephALEXin 500 MG capsule  Commonly known as:  KEFLEX  Take 1 capsule (500 mg total) by mouth 4 (four) times daily.        Discharge Instructions: Please refer to Patient Instructions section of EMR for full details.  Patient was counseled important signs and symptoms that should prompt return to medical care, changes in medications, dietary instructions, activity restrictions, and follow up appointments.   Follow-Up Appointments:     Follow-up Information   Follow up with Marikay Alar, MD. Call in 1 week. (For hospital follow-up. Call the clinic on Monday to make appointment.)    Specialty:  Family Medicine   Contact information:   [address] [place] [address] [phone]       Joanna Puff, MD 03/25/2014, 3:35 PM PGY-1, [facility] Family Medicine

## 2014-03-25 NOTE — Discharge Summary (Signed)
I agree with the discharge summary as documented.   Verdis Bassette MD  

## 2014-03-28 LAB — CULTURE, BLOOD (ROUTINE X 2)
CULTURE: NO GROWTH
Culture: NO GROWTH

## 2014-05-14 ENCOUNTER — Encounter (HOSPITAL_COMMUNITY): Payer: Self-pay | Admitting: *Deleted

## 2014-05-14 ENCOUNTER — Inpatient Hospital Stay (HOSPITAL_COMMUNITY): Payer: Medicaid Other

## 2014-05-14 ENCOUNTER — Inpatient Hospital Stay (HOSPITAL_COMMUNITY)
Admission: AD | Admit: 2014-05-14 | Discharge: 2014-05-14 | Disposition: A | Discharge status: Home / Self Care | Payer: Self-pay | Source: Ambulatory Visit | Attending: Family Medicine | Admitting: Family Medicine

## 2014-05-14 DIAGNOSIS — M545 Low back pain, unspecified: Secondary | ICD-10-CM | POA: Insufficient documentation

## 2014-05-14 DIAGNOSIS — A5901 Trichomonal vulvovaginitis: Secondary | ICD-10-CM | POA: Insufficient documentation

## 2014-05-14 DIAGNOSIS — O99330 Smoking (tobacco) complicating pregnancy, unspecified trimester: Secondary | ICD-10-CM | POA: Insufficient documentation

## 2014-05-14 DIAGNOSIS — O26899 Other specified pregnancy related conditions, unspecified trimester: Secondary | ICD-10-CM

## 2014-05-14 DIAGNOSIS — O98819 Other maternal infectious and parasitic diseases complicating pregnancy, unspecified trimester: Secondary | ICD-10-CM | POA: Insufficient documentation

## 2014-05-14 DIAGNOSIS — A5903 Trichomonal cystitis and urethritis: Secondary | ICD-10-CM

## 2014-05-14 DIAGNOSIS — R109 Unspecified abdominal pain: Secondary | ICD-10-CM | POA: Insufficient documentation

## 2014-05-14 DIAGNOSIS — M549 Dorsalgia, unspecified: Secondary | ICD-10-CM

## 2014-05-14 DIAGNOSIS — O99891 Other specified diseases and conditions complicating pregnancy: Secondary | ICD-10-CM | POA: Insufficient documentation

## 2014-05-14 DIAGNOSIS — O9989 Other specified diseases and conditions complicating pregnancy, childbirth and the puerperium: Principal | ICD-10-CM

## 2014-05-14 LAB — URINALYSIS, ROUTINE W REFLEX MICROSCOPIC
BILIRUBIN URINE: NEGATIVE
Glucose, UA: NEGATIVE mg/dL
Hgb urine dipstick: NEGATIVE
KETONES UR: NEGATIVE mg/dL
NITRITE: NEGATIVE
PROTEIN: NEGATIVE mg/dL
Specific Gravity, Urine: <1.005 — ABNORMAL LOW (ref 1.005–1.030)
UROBILINOGEN UA: 2 mg/dL — AB (ref 0.0–1.0)
pH: 6.5 (ref 5.0–8.0)

## 2014-05-14 LAB — URINE MICROSCOPIC-ADD ON

## 2014-05-14 LAB — HIV ANTIBODY (ROUTINE TESTING W REFLEX): HIV: NONREACTIVE

## 2014-05-14 LAB — POCT PREGNANCY, URINE: Preg Test, Ur: POSITIVE — AB

## 2014-05-14 LAB — CBC
HEMATOCRIT: 37.4 % (ref 36.0–46.0)
HEMOGLOBIN: 12.9 g/dL (ref 12.0–15.0)
MCH: 31.6 pg (ref 26.0–34.0)
MCHC: 34.5 g/dL (ref 30.0–36.0)
MCV: 91.7 fL (ref 78.0–100.0)
Platelets: 237 10*3/uL (ref 150–400)
RBC: 4.08 MIL/uL (ref 3.87–5.11)
RDW: 13.1 % (ref 11.5–15.5)
WBC: 7 10*3/uL (ref 4.0–10.5)

## 2014-05-14 LAB — HCG, QUANTITATIVE, PREGNANCY: hCG, Beta Chain, Quant, S: 9554 m[IU]/mL — ABNORMAL HIGH (ref <5)

## 2014-05-14 MED ORDER — CYCLOBENZAPRINE HCL 10 MG PO TABS
5.0000 mg | ORAL_TABLET | Freq: Two times a day (BID) | ORAL | Status: DC | PRN
Start: 2014-05-14 — End: 2014-12-27

## 2014-05-14 MED ORDER — CYCLOBENZAPRINE HCL 10 MG PO TABS
10.0000 mg | ORAL_TABLET | Freq: Once | ORAL | Status: AC
  Administered 2014-05-14: 10 mg via ORAL
  Filled 2014-05-14: qty 1

## 2014-05-14 MED ORDER — CYCLOBENZAPRINE HCL 10 MG PO TABS: 5.0000 mg | ORAL_TABLET | Freq: Two times a day (BID) | ORAL | Status: DC | PRN

## 2014-05-14 MED ORDER — METRONIDAZOLE 500 MG PO TABS
2000.0000 mg | ORAL_TABLET | Freq: Once | ORAL | Status: AC
  Administered 2014-05-14: 2000 mg via ORAL
  Filled 2014-05-14: qty 4

## 2014-05-14 NOTE — MAU Note (Signed)
Pain started about 3 wks, has gotten worse.  Pain in lower abd, through to back.  Had missed period, 2 +HPT. Thinks pain is related.

## 2014-05-14 NOTE — Discharge Instructions (Signed)
Back Exercises Back exercises help treat and prevent back injuries. The goal is to increase your strength in your belly (abdominal) and back muscles. These exercises can also help with flexibility. Start these exercises when told by your doctor. HOME CARE Back exercises include: Pelvic Tilt.  Lie on your back with your knees bent. Tilt your pelvis until the lower part of your back is against the floor. Hold this position 5 to 10 sec. Repeat this exercise 5 to 10 times. Knee to Chest.  Pull 1 knee up against your chest and hold for 20 to 30 seconds. Repeat this with the other knee. This may be done with the other leg straight or bent, whichever feels better. Then, pull both knees up against your chest. Sit-Ups or Curl-Ups.  Bend your knees 90 degrees. Start with tilting your pelvis, and do a partial, slow sit-up. Only lift your upper half 30 to 45 degrees off the floor. Take at least 2 to 3 seonds for each sit-up. Do not do sit-ups with your knees out straight. If partial sit-ups are difficult, simply do the above but with only tightening your belly (abdominal) muscles and holding it as told. Hip-Lift.  Lie on your back with your knees flexed 90 degrees. Push down with your feet and shoulders as you raise your hips 2 inches off the floor. Hold for 10 seconds, repeat 5 to 10 times. Back Arches.  Lie on your stomach. Prop yourself up on bent elbows. Slowly press on your hands, causing an arch in your low back. Repeat 3 to 5 times. Shoulder-Lifts.  Lie face down with arms beside your body. Keep hips and belly pressed to floor as you slowly lift your head and shoulders off the floor. Do not overdo your exercises. Be careful in the beginning. Exercises may cause you some mild back discomfort. If the pain lasts for more than 15 minutes, stop the exercises until you see your doctor. Improvement with exercise for back problems is slow.  Document Released: 10/02/2010 Document Revised: 11/22/2011  Document Reviewed: 07/01/2011 [facility] Patient Information 2015 [place], [place]. This information is not intended to replace advice given to you by your health care provider. Make sure you discuss any questions you have with your health care provider.    Trichomonas Test The trichomonas test is done to diagnose trichomoniasis, an infection caused by an organism called Trichomonas. Trichomoniasis is a sexually transmitted infection (STI). In women, it causes vaginal infections. In men, it can cause the tube that carries urine (urethra) to become inflamed (urethritis). You may have this test as a part of a routine screening for STIs or if you have symptoms of trichomoniasis. To perform the test, your health care provider will take a sample of discharge. The sample is taken from the vagina or cervix in women and from the urethra in men. A urine sample can also be used for testing. RESULTS It is your responsibility to obtain your test results. Ask the lab or department performing the test when and how you will get your results. Contact your health care provider to discuss any questions you have about your results.  Meaning of Negative Test Results A negative test means you do not have trichomoniasis. Follow your health care provider's directions about any follow-up testing.  Meaning of Positive Test Results A positive test result means you have an active infection that needs to be treated with antibiotic medicine. All your current sexual partners must also be treated or it is likely you  will get reinfected.  If your test is positive, your health care provider will start you on medicine and may advise you to:  Not have sexual intercourse until your infection has cleared up.  Use a latex condom properly every time you have sexual intercourse.  Limit the number of sexual partners you have. The more partners you have, the greater your risk of contracting trichomoniasis or another STI.  Tell all  sexual partners about your infection so they can also be treated and to prevent reinfection. Document Released: 10/02/2004 Document Revised: 01/14/2014 Document Reviewed: 09/11/2013 [facility] Patient Information 2015 [place], [place]. This information is not intended to replace advice given to you by your health care provider. Make sure you discuss any questions you have with your health care provider.

## 2014-05-14 NOTE — MAU Provider Note (Signed)
History     CSN: [id number]  Arrival date and time: 05/14/14 4098   None     Chief Complaint  Patient presents with  . Abdominal Pain   HPI  Pt, Kristi Marshall [redacted]w[redacted]d, is a 79 AA female who presents today with lower abdominal pain and pack pain for the past 3 weeks. Pt states that the abdominal pain in the lower abdomen is crampy and comes and goes. Pt reports that she has had this pain during her previous pregnancies and is not as bad as her current back pain. Pt states that her back pain is much more severe and is sometimes precipitated by her abdominal cramps. Lower back pain is rated as a 10/10 currently and a 14/10 at its worst. Pt feels spasms with pain and says it is sharp and stabbing in quality. Pt reports a distant history of a fall down the stairs and states that her back has not been the same since. 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 Has taken ibuprofen with no relief. The only thing that really helps is lying flat on her carpeted floor.  Denies abdominal surgeries and recent trauma to abdomen and back.  OB History   Grav Para Term Preterm Abortions TAB SAB Ect Mult Living   0 3 3 0 0 0 4      Past Medical History  Diagnosis Date  . Anxiety   . PIH (pregnancy induced hypertension)   . Bronchitis   . Seasonal allergies   . Pregnancy induced hypertension   . Seizure     pseudoseizures; stress related  . Pyelonephritis     Past Surgical History  Procedure Laterality Date  . Abortions    . Dilation and curettage of uterus      ? retained POC     Family History  Problem Relation Age of Onset  . Hypertension Mother   . Diabetes Maternal Aunt   . Hypertension Maternal Grandmother   . Diabetes Maternal Grandmother   . Heart disease Maternal Grandmother   . Hypertension Maternal Grandfather   . Heart disease Maternal Grandfather   . Anesthesia problems Neg Hx   . Hypotension Neg Hx   .  Malignant hyperthermia Neg Hx   . Pseudochol deficiency Neg Hx   . Other Neg Hx   . Hearing loss Neg Hx     History  Substance Use Topics  . Smoking status: Current Some Day Smoker -- 7 years    Types: Cigarettes  . Smokeless tobacco: Never Used  . Alcohol Use: No    Allergies:  Allergies  Allergen Reactions  . Bactrim [Sulfamethoxazole-Trimethoprim] Nausea And Vomiting and Other (See Comments)    high fevers.   . Ibuprofen Itching    Prescriptions prior to admission  Medication Sig Dispense Refill  . DiphenhydrAMINE HCl (BENADRYL ALLERGY PO) Take 5 mLs by mouth daily as needed (allergies).      . Ibuprofen (IBU PO) Take 2 tablets by mouth daily as needed (headache).      Marland Kitchen albuterol (PROVENTIL HFA;VENTOLIN HFA) 108 (90 BASE) MCG/ACT inhaler Inhale 2 puffs into the lungs every 6 (six) hours as needed. For shortness of breath  1 Inhaler  2    Review of Systems  Constitutional: Negative for fever, chills, malaise/fatigue and diaphoresis.  Eyes: Negative for blurred vision and double vision.  Respiratory: Negative for cough, shortness of breath and wheezing.   Cardiovascular:  Negative for chest pain and palpitations.  Gastrointestinal: Positive for heartburn and abdominal pain. Negative for nausea, vomiting, diarrhea, constipation and blood in stool.  Genitourinary: Negative for dysuria and hematuria.  Neurological: Positive for dizziness. Negative for weakness and headaches.   Physical Exam   Blood pressure 133/76, pulse 119, temperature 98.2 F (36.8 C), temperature source Oral, resp. rate 18, height 5' 2.5" (1.588 m), weight 80.74 kg (178 lb), last menstrual period 03/25/2014.  Physical Exam  Constitutional: She is oriented to person, place, and time. She appears well-developed and well-nourished.  HENT:  Head: Normocephalic and atraumatic.  Cardiovascular: Regular rhythm, normal heart sounds and intact distal pulses.  Tachycardia present.   No murmur  heard. Respiratory: Effort normal and breath sounds normal. No respiratory distress. She has no wheezes.  GI: Soft. Bowel sounds are normal. She exhibits no distension. There is tenderness in the right lower quadrant. There is no guarding.  Genitourinary: There is no lesion on the right labia. There is no lesion on the left labia. Uterus is not tender. Cervix exhibits motion tenderness. Cervix exhibits no friability. Right adnexum displays no tenderness. Left adnexum displays no tenderness.  Musculoskeletal:       Lumbar back: She exhibits tenderness.  Neurological: She is alert and oriented to person, place, and time.  Skin: Skin is warm.  Psychiatric: She has a normal mood and affect. Her behavior is normal.    MAU Course  Procedures  MDM Considered ectopic pregnancy, back strain, compression fracture, PID, UTI.   Assessment and Plan  1. Back pain of pregnancy  Patient was given Flexeril  PO and reports adequate relief.  Patient sent home with back stretches, script for Flexeril.  Also recommended using tylenol and heating pad.  Instructed her to follow up with PCP.  2. Trichomoniasis  Given Flagyl .  GC/Chlamydia swab obtained as high chance of co-infection.  Advised that partner needs to be treated as well, prior to restarting sexual intercourse.    Azucena Cecil, PA-S2 05/14/2014, 10:23 AM   Patient seen and examined in conjunction with Mr Madelyn Brunner, [place].  I took the history and have edited the above note as needed.   Candelaria Celeste, DO Attending Physician Faculty Practice, [facility]

## 2014-05-15 LAB — GC/CHLAMYDIA PROBE AMP
CT Probe RNA: NEGATIVE
GC Probe RNA: NEGATIVE

## 2014-07-15 ENCOUNTER — Encounter (HOSPITAL_COMMUNITY): Payer: Self-pay | Admitting: *Deleted

## 2014-08-09 ENCOUNTER — Encounter (HOSPITAL_COMMUNITY): Payer: Self-pay | Admitting: *Deleted

## 2014-08-09 ENCOUNTER — Inpatient Hospital Stay (HOSPITAL_COMMUNITY)
Admission: AD | Admit: 2014-08-09 | Discharge: 2014-08-12 | DRG: 774 | Disposition: A | Discharge status: Home / Self Care | Payer: Medicaid Other | Source: Ambulatory Visit | Attending: Obstetrics & Gynecology | Admitting: Obstetrics & Gynecology

## 2014-08-09 DIAGNOSIS — G40909 Epilepsy, unspecified, not intractable, without status epilepticus: Secondary | ICD-10-CM | POA: Diagnosis present

## 2014-08-09 DIAGNOSIS — O99334 Smoking (tobacco) complicating childbirth: Secondary | ICD-10-CM | POA: Diagnosis present

## 2014-08-09 DIAGNOSIS — Z833 Family history of diabetes mellitus: Secondary | ICD-10-CM

## 2014-08-09 DIAGNOSIS — N12 Tubulo-interstitial nephritis, not specified as acute or chronic: Secondary | ICD-10-CM | POA: Diagnosis present

## 2014-08-09 DIAGNOSIS — F17210 Nicotine dependence, cigarettes, uncomplicated: Secondary | ICD-10-CM | POA: Diagnosis present

## 2014-08-09 DIAGNOSIS — O99352 Diseases of the nervous system complicating pregnancy, second trimester: Secondary | ICD-10-CM | POA: Diagnosis present

## 2014-08-09 DIAGNOSIS — O2302 Infections of kidney in pregnancy, second trimester: Secondary | ICD-10-CM | POA: Diagnosis present

## 2014-08-09 DIAGNOSIS — Z8249 Family history of ischemic heart disease and other diseases of the circulatory system: Secondary | ICD-10-CM

## 2014-08-09 DIAGNOSIS — Z3A18 18 weeks gestation of pregnancy: Secondary | ICD-10-CM | POA: Diagnosis present

## 2014-08-09 LAB — COMPREHENSIVE METABOLIC PANEL
ALK PHOS: 76 U/L (ref 39–117)
ALT: 7 U/L (ref 0–35)
AST: 9 U/L (ref 0–37)
Albumin: 2.9 g/dL — ABNORMAL LOW (ref 3.5–5.2)
Anion gap: 12 (ref 5–15)
BUN: 7 mg/dL (ref 6–23)
CO2: 21 meq/L (ref 19–32)
Calcium: 9 mg/dL (ref 8.4–10.5)
Chloride: 101 mEq/L (ref 96–112)
Creatinine, Ser: 0.48 mg/dL — ABNORMAL LOW (ref 0.50–1.10)
GFR calc Af Amer: >90 mL/min (ref >90)
GFR calc non Af Amer: >90 mL/min (ref >90)
Glucose, Bld: 96 mg/dL (ref 70–99)
Potassium: 3.9 mEq/L (ref 3.7–5.3)
SODIUM: 134 meq/L — AB (ref 137–147)
TOTAL PROTEIN: 6.6 g/dL (ref 6.0–8.3)
Total Bilirubin: <0.2 mg/dL — ABNORMAL LOW (ref 0.3–1.2)

## 2014-08-09 LAB — CBC
HCT: 32.9 % — ABNORMAL LOW (ref 36.0–46.0)
HEMOGLOBIN: 11.1 g/dL — AB (ref 12.0–15.0)
MCH: 31.4 pg (ref 26.0–34.0)
MCHC: 33.7 g/dL (ref 30.0–36.0)
MCV: 93.2 fL (ref 78.0–100.0)
PLATELETS: 256 10*3/uL (ref 150–400)
RBC: 3.53 MIL/uL — ABNORMAL LOW (ref 3.87–5.11)
RDW: 12.7 % (ref 11.5–15.5)
WBC: 15 10*3/uL — ABNORMAL HIGH (ref 4.0–10.5)

## 2014-08-09 LAB — URINE MICROSCOPIC-ADD ON

## 2014-08-09 LAB — URINALYSIS, ROUTINE W REFLEX MICROSCOPIC
Bilirubin Urine: NEGATIVE
Glucose, UA: NEGATIVE mg/dL
Ketones, ur: NEGATIVE mg/dL
NITRITE: POSITIVE — AB
PH: 7 (ref 5.0–8.0)
Protein, ur: NEGATIVE mg/dL
SPECIFIC GRAVITY, URINE: 1.015 (ref 1.005–1.030)
Urobilinogen, UA: 0.2 mg/dL (ref 0.0–1.0)

## 2014-08-09 LAB — WET PREP, GENITAL
CLUE CELLS WET PREP: NONE SEEN
TRICH WET PREP: NONE SEEN
Yeast Wet Prep HPF POC: NONE SEEN

## 2014-08-09 LAB — HIV ANTIBODY (ROUTINE TESTING W REFLEX): HIV: NONREACTIVE

## 2014-08-09 MED ORDER — OXYCODONE-ACETAMINOPHEN 5-325 MG PO TABS
1.0000 | ORAL_TABLET | Freq: Once | ORAL | Status: AC
  Administered 2014-08-09: 1 via ORAL
  Filled 2014-08-09: qty 1

## 2014-08-09 MED ORDER — CEFTRIAXONE SODIUM IN DEXTROSE 20 MG/ML IV SOLN
1.0000 g | Freq: Two times a day (BID) | INTRAVENOUS | Status: DC
  Administered 2014-08-09 – 2014-08-12 (×6): 1 g via INTRAVENOUS
  Filled 2014-08-09 (×5): qty 50

## 2014-08-09 MED ORDER — SODIUM CHLORIDE 0.9 % IV SOLN
INTRAVENOUS | Status: DC
  Administered 2014-08-09 – 2014-08-12 (×5): via INTRAVENOUS

## 2014-08-09 MED ORDER — LACTATED RINGERS IV SOLN
Freq: Once | INTRAVENOUS | Status: AC
  Administered 2014-08-09: 16:00:00 via INTRAVENOUS

## 2014-08-09 MED ORDER — ACETAMINOPHEN 325 MG PO TABS
650.0000 mg | ORAL_TABLET | ORAL | Status: DC | PRN
  Administered 2014-08-09 – 2014-08-10 (×4): 650 mg via ORAL
  Filled 2014-08-09 (×4): qty 2

## 2014-08-09 MED ORDER — PROMETHAZINE HCL 25 MG PO TABS
25.0000 mg | ORAL_TABLET | Freq: Once | ORAL | Status: AC
  Administered 2014-08-09: 25 mg via ORAL
  Filled 2014-08-09 (×2): qty 1

## 2014-08-09 MED ORDER — DOCUSATE SODIUM 100 MG PO CAPS
100.0000 mg | ORAL_CAPSULE | Freq: Every day | ORAL | Status: DC
  Administered 2014-08-10 – 2014-08-11 (×2): 100 mg via ORAL
  Filled 2014-08-09 (×3): qty 1

## 2014-08-09 MED ORDER — CALCIUM CARBONATE ANTACID 500 MG PO CHEW: 2.0000 | CHEWABLE_TABLET | ORAL | Status: DC | PRN

## 2014-08-09 MED ORDER — LACTATED RINGERS IV SOLN
INTRAVENOUS | Status: DC
  Filled 2014-08-09 (×2): qty 1000

## 2014-08-09 MED ORDER — PRENATAL MULTIVITAMIN CH
1.0000 | ORAL_TABLET | Freq: Every day | ORAL | Status: DC
  Administered 2014-08-10 – 2014-08-11 (×2): 1 via ORAL
  Filled 2014-08-09 (×2): qty 1

## 2014-08-09 MED ORDER — ZOLPIDEM TARTRATE 5 MG PO TABS: 5.0000 mg | ORAL_TABLET | Freq: Every evening | ORAL | Status: DC | PRN

## 2014-08-09 NOTE — H&P (Signed)
FACULTY PRACTICE ANTEPARTUM ADMISSION HISTORY AND PHYSICAL NOTE   History of Present Illness: Kristi Marshall is a 28 y.o. [phone] at [redacted]w[redacted]d admitted for pyelonephritis Patient reports the fetal movement as active. Patient reports uterine contraction  activity as none. Patient reports  vaginal bleeding as none. Patient describes fluid per vagina as None. Fetal presentation is unsure.  Patient Active Problem List   Diagnosis Date Noted  . Pyelonephritis affecting pregnancy in second trimester, antepartum 08/09/2014  . Pyelonephritis 03/22/2014  . Menorrhagia 07/31/2012  . Dysmenorrhea 07/31/2012  . Single episode of elevated blood pressure 07/31/2012  . Seizure-like activity 06/20/2012  . THYROMEGALY 12/17/2009  . ALLERGIC RHINITIS 12/17/2009  . TOBACCO USER 05/24/2009  . SEIZURE DISORDER 12/20/2008     Past Medical History  Diagnosis Date  . Anxiety   . PIH (pregnancy induced hypertension)   . Bronchitis   . Seasonal allergies   . Pregnancy induced hypertension   . Seizure     pseudoseizures; stress related  . Pyelonephritis      Past Surgical History  Procedure Laterality Date  . Abortions    . Dilation and curettage of uterus      ? retained POC      OB History    Gravida Para Term Preterm AB TAB SAB Ectopic Multiple Living   8 4 4  0 3 3 0 0 0 4      History   Social History  . Marital Status: Single    Spouse Name: N/A    Number of Children: N/A  . Years of Education: N/A   Social History Main Topics  . Smoking status: Current Some Day Smoker -- 7 years    Types: Cigarettes  . Smokeless tobacco: Never Used  . Alcohol Use: No  . Drug Use: No  . Sexual Activity: Yes    Birth Control/ Protection: Condom     Comment: planning for depo   Other Topics Concern  . None   Social History Narrative    Family History  Problem Relation Age of Onset  . Hypertension Mother   . Diabetes Maternal Aunt   . Hypertension Maternal Grandmother   .  Diabetes Maternal Grandmother   . Heart disease Maternal Grandmother   . Hypertension Maternal Grandfather   . Heart disease Maternal Grandfather   . Anesthesia problems Neg Hx   . Hypotension Neg Hx   . Malignant hyperthermia Neg Hx   . Pseudochol deficiency Neg Hx   . Other Neg Hx   . Hearing loss Neg Hx     Allergies  Allergen Reactions  . Bactrim [Sulfamethoxazole-Trimethoprim] Nausea And Vomiting and Other (See Comments)    high fevers.   . Ibuprofen Itching    Prescriptions prior to admission  Medication Sig Dispense Refill Last Dose  . albuterol (PROVENTIL HFA;VENTOLIN HFA) 108 (90 BASE) MCG/ACT inhaler Inhale 2 puffs into the lungs every 6 (six) hours as needed. For shortness of breath 1 Inhaler 2 rescue  . cyclobenzaprine (FLEXERIL) 10 MG tablet Take 0.5-1 tablets (5-10 mg total) by mouth 2 (two) times daily as needed for muscle spasms. 20 tablet 0 prn  . DiphenhydrAMINE HCl (BENADRYL ALLERGY PO) Take 5 mLs by mouth daily as needed (allergies).   prn    . cefTRIAXone (ROCEPHIN)  IV  1 g Intravenous Q12H  . docusate sodium  100 mg Oral Daily  . [START ON 08/10/2014] prenatal multivitamin  1 tablet Oral Q1200   I have reviewed the patient's current  medications. Prior to Admission:  Prescriptions prior to admission  Medication Sig Dispense Refill Last Dose  . albuterol (PROVENTIL HFA;VENTOLIN HFA) 108 (90 BASE) MCG/ACT inhaler Inhale 2 puffs into the lungs every 6 (six) hours as needed. For shortness of breath 1 Inhaler 2 rescue  . cyclobenzaprine (FLEXERIL) 10 MG tablet Take 0.5-1 tablets (5-10 mg total) by mouth 2 (two) times daily as needed for muscle spasms. 20 tablet 0 prn  . DiphenhydrAMINE HCl (BENADRYL ALLERGY PO) Take 5 mLs by mouth daily as needed (allergies).   prn   Scheduled: . cefTRIAXone (ROCEPHIN)  IV  1 g Intravenous Q12H  . docusate sodium  100 mg Oral Daily  . [START ON 08/10/2014] prenatal multivitamin  1 tablet Oral Q1200   Continuous:   Review  of Systems - General ROS: positive for  - chills, fatigue, fever and malaise Psychological ROS: negative Respiratory ROS: no cough, shortness of breath, or wheezing Cardiovascular ROS: no chest pain or dyspnea on exertion Gastrointestinal ROS: positive for - appetite loss and nausea/vomiting Genito-Urinary ROS: positive for - dysuria Musculoskeletal ROS: negative Neurological ROS: no TIA or stroke symptoms Dermatological ROS: negative  Vitals:  BP 124/66 mmHg  Pulse 112  Temp(Src) 99.3 F (37.4 C) (Oral)  Resp 20  Wt 187 lb (84.823 kg)  LMP 03/25/2014 Physical Examination:  General appearance - alert, well appearing, and in no distress Mental status - alert, oriented to person, place, and time Eyes - pupils equal and reactive, extraocular eye movements intact Heart - normal rate, regular rhythm, normal S1, S2, no murmurs, rubs, clicks or gallops Abdomen - soft, nontender, nondistended, no masses or organomegaly Back exam - +CVA TTP Neurological - alert, oriented, normal speech, no focal findings or movement disorder noted Musculoskeletal - no joint tenderness, deformity or swelling Extremities - peripheral pulses normal, no pedal edema, no clubbing or cyanosis Abdomen: gravid   Labs:  Results for orders placed or performed during the hospital encounter of 08/09/14 (from the past 24 hour(s))  Urinalysis, Routine w reflex microscopic   Collection Time: 08/09/14  2:15 PM  Result Value Ref Range   Color, Urine YELLOW YELLOW   APPearance HAZY (A) CLEAR   Specific Gravity, Urine 1.015 1.005 - 1.030   pH 7.0 5.0 - 8.0   Glucose, UA NEGATIVE NEGATIVE mg/dL   Hgb urine dipstick MODERATE (A) NEGATIVE   Bilirubin Urine NEGATIVE NEGATIVE   Ketones, ur NEGATIVE NEGATIVE mg/dL   Protein, ur NEGATIVE NEGATIVE mg/dL   Urobilinogen, UA 0.2 0.0 - 1.0 mg/dL   Nitrite POSITIVE (A) NEGATIVE   Leukocytes, UA SMALL (A) NEGATIVE  Urine microscopic-add on   Collection Time: 08/09/14  2:15 PM   Result Value Ref Range   Squamous Epithelial / LPF FEW (A) RARE   WBC, UA 11-20 <3 WBC/hpf   RBC / HPF 0-2 <3 RBC/hpf   Bacteria, UA MANY (A) RARE  CBC   Collection Time: 08/09/14  3:10 PM  Result Value Ref Range   WBC 15.0 (H) 4.0 - 10.5 K/uL   RBC 3.53 (L) 3.87 - 5.11 MIL/uL   Hemoglobin 11.1 (L) 12.0 - 15.0 g/dL   HCT 16.132.9 (L) 09.636.0 - 04.546.0 %   MCV 93.2 78.0 - 100.0 fL   MCH 31.4 26.0 - 34.0 pg   MCHC 33.7 30.0 - 36.0 g/dL   RDW 40.912.7 81.111.5 - 91.415.5 %   Platelets 256 150 - 400 K/uL  Comprehensive metabolic panel   Collection Time: 08/09/14  3:10 PM  Result Value Ref Range   Sodium 134 (L) 137 - 147 mEq/L   Potassium 3.9 3.7 - 5.3 mEq/L   Chloride 101 96 - 112 mEq/L   CO2 21 19 - 32 mEq/L   Glucose, Bld 96 70 - 99 mg/dL   BUN 7 6 - 23 mg/dL   Creatinine, Ser 0.980.48 (L) 0.50 - 1.10 mg/dL   Calcium 9.0 8.4 - 11.910.5 mg/dL   Total Protein 6.6 6.0 - 8.3 g/dL   Albumin 2.9 (L) 3.5 - 5.2 g/dL   AST 9 0 - 37 U/L   ALT 7 0 - 35 U/L   Alkaline Phosphatase 76 39 - 117 U/L   Total Bilirubin <0.2 (L) 0.3 - 1.2 mg/dL   GFR calc non Af Amer >90 >90 mL/min   GFR calc Af Amer >90 >90 mL/min   Anion gap 12 5 - 15  Wet prep, genital   Collection Time: 08/09/14  4:23 PM  Result Value Ref Range   Yeast Wet Prep HPF POC NONE SEEN NONE SEEN   Trich, Wet Prep NONE SEEN NONE SEEN   Clue Cells Wet Prep HPF POC NONE SEEN NONE SEEN   WBC, Wet Prep HPF POC FEW (A) NONE SEEN    Imaging Studies: No results found.   Assessment and Plan:  Patient is 28 y.o. [id number] 5261w2d here with pyelonephritis  Patient Active Problem List   Diagnosis Date Noted  . Pyelonephritis affecting pregnancy in second trimester, antepartum 08/09/2014  . Pyelonephritis 03/22/2014  . Menorrhagia 07/31/2012  . Dysmenorrhea 07/31/2012  . Single episode of elevated blood pressure 07/31/2012  . Seizure-like activity 06/20/2012  . THYROMEGALY 12/17/2009  . ALLERGIC RHINITIS 12/17/2009  . TOBACCO USER 05/24/2009  . SEIZURE  DISORDER 12/20/2008    Rocephin 1g q12h, q8h fetal heart tones via doppler, urine culture, phenergan prn G/c done blood cultures not done although would be ideal, will not order as pt has already received antibiotics.  Perry MountACOSTA,Symon Norwood ROCIO, MD OB fellow Faculty Practice, [facility]

## 2014-08-09 NOTE — MAU Note (Signed)
Yesterday she had got real sick. Know it is a problem with her kidneys.  There is a foul smell to her urine, sometimes it comes through her mouth.   Yesterday was nauseated, had a bad HA.  Fever of 101.  + rt CVA tenderness.

## 2014-08-09 NOTE — MAU Provider Note (Signed)
History     CSN: [id number]  Arrival date and time: 08/09/14 1409   First Provider Initiated Contact with Patient 08/09/14 1456      Chief Complaint  Patient presents with  . Flank Pain   HPI Comments: Belva CromeBrittany S Sirico 28 y.o. [id number] 4380w2d presents to MAU with right flank pain, foul smelling urine, fever yesterday, nausea and headache. Her symptoms have been ongoing since Wednesday. She is not receiving prenatal care as yet. She has taken no medications.    Flank Pain Associated symptoms include a fever.      Past Medical History  Diagnosis Date  . Anxiety   . PIH (pregnancy induced hypertension)   . Bronchitis   . Seasonal allergies   . Pregnancy induced hypertension   . Seizure     pseudoseizures; stress related  . Pyelonephritis     Past Surgical History  Procedure Laterality Date  . Abortions    . Dilation and curettage of uterus      ? retained POC     Family History  Problem Relation Age of Onset  . Hypertension Mother   . Diabetes Maternal Aunt   . Hypertension Maternal Grandmother   . Diabetes Maternal Grandmother   . Heart disease Maternal Grandmother   . Hypertension Maternal Grandfather   . Heart disease Maternal Grandfather   . Anesthesia problems Neg Hx   . Hypotension Neg Hx   . Malignant hyperthermia Neg Hx   . Pseudochol deficiency Neg Hx   . Other Neg Hx   . Hearing loss Neg Hx     History  Substance Use Topics  . Smoking status: Current Some Day Smoker -- 7 years    Types: Cigarettes  . Smokeless tobacco: Never Used  . Alcohol Use: No    Allergies:  Allergies  Allergen Reactions  . Bactrim [Sulfamethoxazole-Trimethoprim] Nausea And Vomiting and Other (See Comments)    high fevers.   . Ibuprofen Itching    Prescriptions prior to admission  Medication Sig Dispense Refill Last Dose  . albuterol (PROVENTIL HFA;VENTOLIN HFA) 108 (90 BASE) MCG/ACT inhaler Inhale 2 puffs into the lungs every 6 (six) hours as needed. For  shortness of breath 1 Inhaler 2 rescue  . cyclobenzaprine (FLEXERIL) 10 MG tablet Take 0.5-1 tablets (5-10 mg total) by mouth 2 (two) times daily as needed for muscle spasms. 20 tablet 0 prn  . DiphenhydrAMINE HCl (BENADRYL ALLERGY PO) Take 5 mLs by mouth daily as needed (allergies).   prn    Review of Systems  Constitutional: Positive for fever and chills.  HENT: Negative.   Eyes: Negative.   Respiratory: Negative.   Cardiovascular: Negative.   Gastrointestinal: Positive for nausea.  Genitourinary: Positive for flank pain.       Urine odor  Skin: Negative.   Neurological: Negative.   Psychiatric/Behavioral: Negative.    Physical Exam   Blood pressure 124/66, pulse 112, temperature 99.3 F (37.4 C), temperature source Oral, resp. rate 20, weight 84.823 kg (187 lb), last menstrual period 03/25/2014.  Physical Exam  Constitutional: She is oriented to person, place, and time. She appears well-developed and well-nourished. She appears distressed.  Appears uncomfortable   HENT:  Head: Normocephalic and atraumatic.  Eyes: Pupils are equal, round, and reactive to light.  Cardiovascular: Normal rate and normal heart sounds.   tachycardia  Respiratory: Effort normal and breath sounds normal.  GI: Soft. There is tenderness.  Genitourinary:  Genital:external negative Vaginal:small amount white discharge Cervix:closed/ thick Bimanual: tender  over bladder/ gravid   Neurological: She is alert and oriented to person, place, and time.  Skin: Skin is warm.  Psychiatric: She has a normal mood and affect. Her behavior is normal. Judgment and thought content normal.   Results for orders placed or performed during the hospital encounter of 08/09/14 (from the past 24 hour(s))  Urinalysis, Routine w reflex microscopic     Status: Abnormal   Collection Time: 08/09/14  2:15 PM  Result Value Ref Range   Color, Urine YELLOW YELLOW   APPearance HAZY (A) CLEAR   Specific Gravity, Urine 1.015  1.005 - 1.030   pH 7.0 5.0 - 8.0   Glucose, UA NEGATIVE NEGATIVE mg/dL   Hgb urine dipstick MODERATE (A) NEGATIVE   Bilirubin Urine NEGATIVE NEGATIVE   Ketones, ur NEGATIVE NEGATIVE mg/dL   Protein, ur NEGATIVE NEGATIVE mg/dL   Urobilinogen, UA 0.2 0.0 - 1.0 mg/dL   Nitrite POSITIVE (A) NEGATIVE   Leukocytes, UA SMALL (A) NEGATIVE  Urine microscopic-add on     Status: Abnormal   Collection Time: 08/09/14  2:15 PM  Result Value Ref Range   Squamous Epithelial / LPF FEW (A) RARE   WBC, UA 11-20 <3 WBC/hpf   RBC / HPF 0-2 <3 RBC/hpf   Bacteria, UA MANY (A) RARE  CBC     Status: Abnormal   Collection Time: 08/09/14  3:10 PM  Result Value Ref Range   WBC 15.0 (H) 4.0 - 10.5 K/uL   RBC 3.53 (L) 3.87 - 5.11 MIL/uL   Hemoglobin 11.1 (L) 12.0 - 15.0 g/dL   HCT 32.432.9 (L) 40.136.0 - 02.746.0 %   MCV 93.2 78.0 - 100.0 fL   MCH 31.4 26.0 - 34.0 pg   MCHC 33.7 30.0 - 36.0 g/dL   RDW 25.312.7 66.411.5 - 40.315.5 %   Platelets 256 150 - 400 K/uL  Comprehensive metabolic panel     Status: Abnormal   Collection Time: 08/09/14  3:10 PM  Result Value Ref Range   Sodium 134 (L) 137 - 147 mEq/L   Potassium 3.9 3.7 - 5.3 mEq/L   Chloride 101 96 - 112 mEq/L   CO2 21 19 - 32 mEq/L   Glucose, Bld 96 70 - 99 mg/dL   BUN 7 6 - 23 mg/dL   Creatinine, Ser 4.740.48 (L) 0.50 - 1.10 mg/dL   Calcium 9.0 8.4 - 25.910.5 mg/dL   Total Protein 6.6 6.0 - 8.3 g/dL   Albumin 2.9 (L) 3.5 - 5.2 g/dL   AST 9 0 - 37 U/L   ALT 7 0 - 35 U/L   Alkaline Phosphatase 76 39 - 117 U/L   Total Bilirubin <0.2 (L) 0.3 - 1.2 mg/dL   GFR calc non Af Amer >90 >90 mL/min   GFR calc Af Amer >90 >90 mL/min   Anion gap 12 5 - 15  Wet prep, genital     Status: Abnormal   Collection Time: 08/09/14  4:23 PM  Result Value Ref Range   Yeast Wet Prep HPF POC NONE SEEN NONE SEEN   Trich, Wet Prep NONE SEEN NONE SEEN   Clue Cells Wet Prep HPF POC NONE SEEN NONE SEEN   WBC, Wet Prep HPF POC FEW (A) NONE SEEN   .Marland Kitchen.  MAU Course   Procedures  MDM  Phenergan/ percocet for pain and nausea CBC, CMET IV LR   Spoke with Dr Despina HiddenEure who advised admitting  Assessment and Plan   A: Pyelonephritis P: Admit/ Dr Loreta AveAcosta  called  Carolynn Serve 08/09/2014, 5:10 PM

## 2014-08-09 NOTE — MAU Note (Signed)
Pt declines phenergan @ this time, states she is not nauseated.

## 2014-08-09 NOTE — Plan of Care (Signed)
Problem: Phase I Progression Outcomes Goal: Bowel movement every 2 days Outcome: Completed/Met Date Met:  08/09/14 Goal: No significant worsening bleeding/cervix change/vag drainage No significant worsening in vaginal bleeding, cervical change, or vaginal drainage. Outcome: Completed/Met Date Met:  08/09/14 Goal: Maintains reassuring Fetal Heart Rate Outcome: Completed/Met Date Met:  08/09/14 Goal: Pain controlled with appropriate interventions Outcome: Completed/Met Date Met:  08/09/14 Goal: OOB as tolerated unless otherwise ordered Outcome: Completed/Met Date Met:  08/09/14 Goal: Initial discharge plan identified Outcome: Completed/Met Date Met:  08/09/14 Goal: Hemodynamically stable Outcome: Completed/Met Date Met:  08/09/14

## 2014-08-10 DIAGNOSIS — O99352 Diseases of the nervous system complicating pregnancy, second trimester: Secondary | ICD-10-CM

## 2014-08-10 DIAGNOSIS — N12 Tubulo-interstitial nephritis, not specified as acute or chronic: Secondary | ICD-10-CM

## 2014-08-10 DIAGNOSIS — O99334 Smoking (tobacco) complicating childbirth: Secondary | ICD-10-CM

## 2014-08-10 LAB — CBC
HCT: 29.7 % — ABNORMAL LOW (ref 36.0–46.0)
Hemoglobin: 10.3 g/dL — ABNORMAL LOW (ref 12.0–15.0)
MCH: 32.5 pg (ref 26.0–34.0)
MCHC: 34.7 g/dL (ref 30.0–36.0)
MCV: 93.7 fL (ref 78.0–100.0)
PLATELETS: 219 10*3/uL (ref 150–400)
RBC: 3.17 MIL/uL — ABNORMAL LOW (ref 3.87–5.11)
RDW: 13 % (ref 11.5–15.5)
WBC: 12.4 10*3/uL — AB (ref 4.0–10.5)

## 2014-08-10 LAB — GC/CHLAMYDIA PROBE AMP
CT PROBE, AMP APTIMA: NEGATIVE
GC Probe RNA: NEGATIVE

## 2014-08-10 MED ORDER — OXYCODONE-ACETAMINOPHEN 5-325 MG PO TABS
1.0000 | ORAL_TABLET | Freq: Once | ORAL | Status: AC
  Administered 2014-08-10: 1 via ORAL
  Filled 2014-08-10: qty 1

## 2014-08-10 MED ORDER — OXYCODONE HCL 5 MG PO TABS
5.0000 mg | ORAL_TABLET | ORAL | Status: DC | PRN
  Administered 2014-08-10 (×2): 5 mg via ORAL
  Administered 2014-08-10: 10 mg via ORAL
  Administered 2014-08-10: 5 mg via ORAL
  Administered 2014-08-11 (×4): 10 mg via ORAL
  Administered 2014-08-12: 5 mg via ORAL
  Filled 2014-08-10 (×2): qty 1
  Filled 2014-08-10: qty 2
  Filled 2014-08-10: qty 1
  Filled 2014-08-10 (×2): qty 2
  Filled 2014-08-10: qty 1
  Filled 2014-08-10 (×2): qty 2

## 2014-08-10 NOTE — Progress Notes (Signed)
Patient ID: Kristi CromeBrittany S Pottinger, female   DOB: August 11, 1986, 28 y.o.   MRN: [id number] FACULTY PRACTICE ANTEPARTUM(COMPREHENSIVE) NOTE  Kristi Marshall is a 28 y.o. [id number] with Estimated Date of Delivery: 01/08/15   By  midtrimester ultrasound 6233w3d  who is admitted for pyelonephritis.    Fetal presentation is unsure. Length of Stay:  1  Days  Date of admission:08/09/2014  Subjective: No change in her pain symptoms Patient reports the fetal movement as +. Patient reports uterine contraction  activity as none. Patient reports  vaginal bleeding as none. Patient describes fluid per vagina as None.  Vitals:  Blood pressure 115/63, pulse 107, temperature 98.8 F (37.1 C), temperature source Oral, resp. rate 16, height 5\' 4"  (1.626 m), weight 188 lb 8 oz (85.503 kg), last menstrual period 03/25/2014, SpO2 97 %. Filed Vitals:   08/09/14 1857 08/09/14 2101 08/10/14 0157 08/10/14 0527  BP: 112/55 130/67 129/70 115/63  Pulse: 100 97 102 107  Temp: 98.5 F (36.9 C) 98.6 F (37 C) 99.6 F (37.6 C) 98.8 F (37.1 C)  TempSrc: Oral Oral Oral Oral  Resp:  18 18 16   Height:    5\' 4"  (1.626 m)  Weight:    188 lb 8 oz (85.503 kg)  SpO2: 100% 98% 100% 97%   Physical Examination:  Back exam - tenderness noted +CVAT R>>L Fundal Height:   Pelvic Exam:  normal external genitalia, vulva, vagina, cervix, uterus and adnexa Cervical Exam: Not evaluated. and found to be not evaluated/ / and fetal presentation is . Extremities: extremities normal, atraumatic, no cyanosis or edema with DTRs  Membranes:intact  Fetal Monitoring: na  Labs:  Results for orders placed or performed during the hospital encounter of 08/09/14 (from the past 24 hour(s))  Urinalysis, Routine w reflex microscopic   Collection Time: 08/09/14  2:15 PM  Result Value Ref Range   Color, Urine YELLOW YELLOW   APPearance HAZY (A) CLEAR   Specific Gravity, Urine 1.015 1.005 - 1.030   pH 7.0 5.0 - 8.0   Glucose, UA NEGATIVE NEGATIVE  mg/dL   Hgb urine dipstick MODERATE (A) NEGATIVE   Bilirubin Urine NEGATIVE NEGATIVE   Ketones, ur NEGATIVE NEGATIVE mg/dL   Protein, ur NEGATIVE NEGATIVE mg/dL   Urobilinogen, UA 0.2 0.0 - 1.0 mg/dL   Nitrite POSITIVE (A) NEGATIVE   Leukocytes, UA SMALL (A) NEGATIVE  Urine microscopic-add on   Collection Time: 08/09/14  2:15 PM  Result Value Ref Range   Squamous Epithelial / LPF FEW (A) RARE   WBC, UA 11-20 <3 WBC/hpf   RBC / HPF 0-2 <3 RBC/hpf   Bacteria, UA MANY (A) RARE  CBC   Collection Time: 08/09/14  3:10 PM  Result Value Ref Range   WBC 15.0 (H) 4.0 - 10.5 K/uL   RBC 3.53 (L) 3.87 - 5.11 MIL/uL   Hemoglobin 11.1 (L) 12.0 - 15.0 g/dL   HCT 14.732.9 (L) 82.936.0 - 56.246.0 %   MCV 93.2 78.0 - 100.0 fL   MCH 31.4 26.0 - 34.0 pg   MCHC 33.7 30.0 - 36.0 g/dL   RDW 13.012.7 86.511.5 - 78.415.5 %   Platelets 256 150 - 400 K/uL  Comprehensive metabolic panel   Collection Time: 08/09/14  3:10 PM  Result Value Ref Range   Sodium 134 (L) 137 - 147 mEq/L   Potassium 3.9 3.7 - 5.3 mEq/L   Chloride 101 96 - 112 mEq/L   CO2 21 19 - 32 mEq/L   Glucose, Bld 96  70 - 99 mg/dL   BUN 7 6 - 23 mg/dL   Creatinine, Ser 1.610.48 (L) 0.50 - 1.10 mg/dL   Calcium 9.0 8.4 - 09.610.5 mg/dL   Total Protein 6.6 6.0 - 8.3 g/dL   Albumin 2.9 (L) 3.5 - 5.2 g/dL   AST 9 0 - 37 U/L   ALT 7 0 - 35 U/L   Alkaline Phosphatase 76 39 - 117 U/L   Total Bilirubin <0.2 (L) 0.3 - 1.2 mg/dL   GFR calc non Af Amer >90 >90 mL/min   GFR calc Af Amer >90 >90 mL/min   Anion gap 12 5 - 15  HIV antibody   Collection Time: 08/09/14  3:22 PM  Result Value Ref Range   HIV 1&2 Ab, 4th Generation NONREACTIVE NONREACTIVE  Wet prep, genital   Collection Time: 08/09/14  4:23 PM  Result Value Ref Range   Yeast Wet Prep HPF POC NONE SEEN NONE SEEN   Trich, Wet Prep NONE SEEN NONE SEEN   Clue Cells Wet Prep HPF POC NONE SEEN NONE SEEN   WBC, Wet Prep HPF POC FEW (A) NONE SEEN  CBC   Collection Time: 08/10/14  5:02 AM  Result Value Ref Range    WBC 12.4 (H) 4.0 - 10.5 K/uL   RBC 3.17 (L) 3.87 - 5.11 MIL/uL   Hemoglobin 10.3 (L) 12.0 - 15.0 g/dL   HCT 04.529.7 (L) 40.936.0 - 81.146.0 %   MCV 93.7 78.0 - 100.0 fL   MCH 32.5 26.0 - 34.0 pg   MCHC 34.7 30.0 - 36.0 g/dL   RDW 91.413.0 78.211.5 - 95.615.5 %   Platelets 219 150 - 400 K/uL    Imaging Studies:      Medications:  Scheduled . cefTRIAXone (ROCEPHIN)  IV  1 g Intravenous Q12H  . docusate sodium  100 mg Oral Daily  . prenatal multivitamin  1 tablet Oral Q1200   I have reviewed the patient's current medications.  ASSESSMENT: [id number] 5247w3d Estimated Date of Delivery: 01/08/15  Patient Active Problem List   Diagnosis Date Noted  . Pyelonephritis affecting pregnancy in second trimester, antepartum 08/09/2014  . Pyelonephritis 03/22/2014  . Menorrhagia 07/31/2012  . Dysmenorrhea 07/31/2012  . Single episode of elevated blood pressure 07/31/2012  . Seizure-like activity 06/20/2012  . THYROMEGALY 12/17/2009  . ALLERGIC RHINITIS 12/17/2009  . TOBACCO USER 05/24/2009  . SEIZURE DISORDER 12/20/2008    PLAN: Rocephin for projected 72-96 hours Home Keflex for additional 10 days Consider nightly suppression thereafter  EURE,LUTHER H 08/10/2014,7:45 AM

## 2014-08-10 NOTE — Plan of Care (Signed)
Problem: Phase I Progression Outcomes Goal: Contractions < 5-6/hour Outcome: Completed/Met Date Met:  08/10/14 Goal: Other Phase I Outcomes/Goals Outcome: Not Applicable Date Met:  16/83/72

## 2014-08-11 DIAGNOSIS — O2302 Infections of kidney in pregnancy, second trimester: Secondary | ICD-10-CM

## 2014-08-11 DIAGNOSIS — N12 Tubulo-interstitial nephritis, not specified as acute or chronic: Secondary | ICD-10-CM

## 2014-08-11 LAB — CULTURE, OB URINE: Special Requests: NORMAL

## 2014-08-11 NOTE — Plan of Care (Signed)
Problem: Consults Goal: Antepartum Patient Education Outcome: Progressing  Problem: Discharge Progression Outcomes Goal: Pain controlled with appropriate interventions Outcome: Completed/Met Date Met:  08/11/14 Goal: Hemodynamically stable Outcome: Completed/Met Date Met:  08/11/14

## 2014-08-11 NOTE — Progress Notes (Signed)
Patient ID: Kristi Marshall, female   DOB: 11-Dec-1985, 28 y.o.   MRN: [id number] [place]) Kristi  ANALYSSE Marshall is a 28 y.o. [id number] at 27w4dwith Estimated Date of Delivery: 01/08/15  by  midtrimester ultrasound who is admitted for pyelonephritis.    Fetal presentation is unsure.  Length of Stay:  2  Days  Date of admission:08/09/2014  Subjective: Pain is improved Patient reports the fetal movement as active. Patient reports uterine contraction activity as none. Patient reports  vaginal bleeding as none. Patient describes fluid per vagina as None.  Vitals:  Blood pressure 100/48, pulse 90, temperature 98.8 F (37.1 C), temperature source Oral, resp. rate 17, height _0  (1.626 m), weight 187 lb 12 oz (85.163 kg), last menstrual period 03/25/2014, SpO2 100 %. Filed Vitals:   08/10/14 1746 08/10/14 2222 08/11/14 0201 08/11/14 0623  BP: 111/71 105/48 92/43 100/48  Pulse: 101 97 93 90  Temp: 99.1 F (37.3 C) 97.7 F (36.5 C) 98.2 F (36.8 C) 98.8 F (37.1 C)  TempSrc: Oral Oral Oral Oral  Resp: _1 Height:      Weight:    187 lb 12 oz (85.163 kg)  SpO2: 100% 100% 99% 100%   Physical Examination: Gen: NAD Back exam: mild R CVAT Pelvic Exam:  normal external genitalia, vulva, vagina, cervix, uterus and adnexa Cervical Exam: Not evaluated.. Extremities: extremities normal, atraumatic, no cyanosis or edema with DTRs  Membranes:intact  Fetal Monitoring: 160 bpm  Labs:  Results for orders placed or performed during the hospital encounter of 08/09/14 (from the past 72 hour(s))  Urinalysis, Routine w reflex microscopic     Status: Abnormal   Collection Time: 08/09/14  2:15 PM  Result Value Ref Range   Color, Urine YELLOW YELLOW   APPearance HAZY (A) CLEAR   Specific Gravity, Urine 1.015 1.005 - 1.030   pH 7.0 5.0 - 8.0   Glucose, UA NEGATIVE NEGATIVE mg/dL   Hgb urine dipstick MODERATE (A) NEGATIVE   Bilirubin Urine NEGATIVE  NEGATIVE   Ketones, ur NEGATIVE NEGATIVE mg/dL   Protein, ur NEGATIVE NEGATIVE mg/dL   Urobilinogen, UA 0.2 0.0 - 1.0 mg/dL   Nitrite POSITIVE (A) NEGATIVE   Leukocytes, UA SMALL (A) NEGATIVE  Urine microscopic-add on     Status: Abnormal   Collection Time: 08/09/14  2:15 PM  Result Value Ref Range   Squamous Epithelial / LPF FEW (A) RARE   WBC, UA 11-20 <3 WBC/hpf   RBC / HPF 0-2 <3 RBC/hpf   Bacteria, UA MANY (A) RARE  Culture, OB Urine     Status: None (Preliminary result)   Collection Time: 08/09/14  2:35 PM  Result Value Ref Range   Specimen Description OB CLEAN CATCH    Special Requests Normal    Culture  Setup Time      08/09/2014 21:39 Performed at [facility]     >=100,000 COLONIES/ML [facility] at [facility] NO GROUP B STREP (S.AGALACTIAE) ISOLATED                                                              Culture based screening of  vaginal/anorectal swabs at  35 to [redacted] weeks gestation is required to rule out the carriage of Group B Streptococcus. Performed at [facility]    Report Status PENDING   CBC     Status: Abnormal   Collection Time: 08/09/14  3:10 PM  Result Value Ref Range   WBC 15.0 (H) 4.0 - 10.5 K/uL   RBC 3.53 (L) 3.87 - 5.11 MIL/uL   Hemoglobin 11.1 (L) 12.0 - 15.0 g/dL   HCT 32.9 (L) 36.0 - 46.0 %   MCV 93.2 78.0 - 100.0 fL   MCH 31.4 26.0 - 34.0 pg   MCHC 33.7 30.0 - 36.0 g/dL   RDW 12.7 11.5 - 15.5 %   Platelets 256 150 - 400 K/uL  Comprehensive metabolic panel     Status: Abnormal   Collection Time: 08/09/14  3:10 PM  Result Value Ref Range   Sodium 134 (L) 137 - 147 mEq/L   Potassium 3.9 3.7 - 5.3 mEq/L   Chloride 101 96 - 112 mEq/L   CO2 21 19 - 32 mEq/L   Glucose, Bld 96 70 - 99 mg/dL   BUN 7 6 - 23 mg/dL   Creatinine, Ser 0.48 (L) 0.50 - 1.10 mg/dL   Calcium 9.0 8.4 - 10.5 mg/dL   Total Protein 6.6 6.0 - 8.3 g/dL   Albumin 2.9 (L) 3.5 - 5.2 g/dL    AST 9 0 - 37 U/L   ALT 7 0 - 35 U/L   Alkaline Phosphatase 76 39 - 117 U/L   Total Bilirubin <0.2 (L) 0.3 - 1.2 mg/dL   GFR calc non Af Amer >90 >90 mL/min   GFR calc Af Amer >90 >90 mL/min    Comment: (Kristi) The eGFR has been calculated using the CKD EPI equation. This calculation has not been validated in all clinical situations. eGFR's persistently <90 mL/min signify possible Chronic Kidney Disease.    Anion gap 12 5 - 15  HIV antibody     Status: None   Collection Time: 08/09/14  3:22 PM  Result Value Ref Range   HIV 1&2 Ab, 4th Generation NONREACTIVE NONREACTIVE    Comment: (Kristi) A NONREACTIVE HIV Ag/Ab result does not exclude HIV infection since the time frame for seroconversion is variable. If acute HIV infection is suspected, a HIV-1 RNA Qualitative TMA test is recommended. HIV-1/2 Antibody Diff         Not indicated. HIV-1 RNA, Qual TMA           Not indicated. PLEASE Kristi: This information has been disclosed to you from records whose confidentiality may be protected by state law. If your state requires such protection, then the state law prohibits you from making any further disclosure of the information without the specific written consent of the person to whom it pertains, or as otherwise permitted by law. A general authorization for the release of medical or other information is NOT sufficient for this purpose. The performance of this assay has not been clinically validated in patients less than 31 years old. Performed at [facility] prep, genital     Status: Abnormal   Collection Time: 08/09/14  4:23 PM  Result Value Ref Range   Yeast Wet Prep HPF POC NONE SEEN NONE SEEN   Trich, Wet Prep NONE SEEN NONE SEEN   Clue Cells Wet Prep HPF POC NONE SEEN NONE SEEN   WBC, Wet Prep HPF POC FEW (A) NONE SEEN    Comment:  MANY BACTERIA SEEN  GC/Chlamydia Probe Amp (multiple spec sources)     Status: None   Collection Time: 08/09/14  4:23 PM  Result  Value Ref Range   CT Probe RNA NEGATIVE NEGATIVE   GC Probe RNA NEGATIVE NEGATIVE    Comment: (Kristi)                                                                                       **Normal Reference Range: Negative**      Assay performed using the Gen-Probe APTIMA COMBO2 (R) Assay. Acceptable specimen types for this assay include APTIMA Swabs (Unisex, endocervical, urethral, or vaginal), first void urine, and ThinPrep liquid based cytology samples. Performed at [facility]   CBC     Status: Abnormal   Collection Time: 08/10/14  5:02 AM  Result Value Ref Range   WBC 12.4 (H) 4.0 - 10.5 K/uL   RBC 3.17 (L) 3.87 - 5.11 MIL/uL   Hemoglobin 10.3 (L) 12.0 - 15.0 g/dL   HCT 29.7 (L) 36.0 - 46.0 %   MCV 93.7 78.0 - 100.0 fL   MCH 32.5 26.0 - 34.0 pg   MCHC 34.7 30.0 - 36.0 g/dL   RDW 13.0 11.5 - 15.5 %   Platelets 219 150 - 400 K/uL    Medications:  Scheduled . cefTRIAXone (ROCEPHIN)  IV  1 g Intravenous Q12H  . docusate sodium  100 mg Oral Daily  . prenatal multivitamin  1 tablet Oral Q1200  I have reviewed the patient's current medications.  ASSESSMENT: [id number]  Estimated Date of Delivery: 01/08/15  Patient Active Problem List   Diagnosis Date Noted  . Pyelonephritis affecting pregnancy in second trimester, antepartum 08/09/2014  . Pyelonephritis 03/22/2014  . Menorrhagia 07/31/2012  . Dysmenorrhea 07/31/2012  . Single episode of elevated blood pressure 07/31/2012  . Seizure-like activity 06/20/2012  . THYROMEGALY 12/17/2009  . ALLERGIC RHINITIS 12/17/2009  . TOBACCO USER 05/24/2009  . SEIZURE DISORDER 12/20/2008    PLAN: Rocephin for at least additional 24 hours; will switch to oral antibiotic based on speciation results; will also need suppression therapy after this treatment regimen given recurrent UTI/pyelonephritis. Routine antenatal care  Allyn Bertoni A, MD 08/11/2014,8:12 AM

## 2014-08-11 NOTE — Plan of Care (Signed)
Problem: Consults Goal: Nutrition Consult-if indicated Outcome: Not Applicable Date Met:  38/33/38

## 2014-08-12 MED ORDER — CEPHALEXIN 500 MG PO CAPS: 500.0000 mg | ORAL_CAPSULE | Freq: Four times a day (QID) | ORAL | Status: DC

## 2014-08-12 NOTE — Progress Notes (Signed)
Pt discharge home with friend... Discharge instructions reviewed with pt and she verbalized understanding... Condition stable... No equipment... Ambulated to car with Selinda MichaelsE. Midge Momon, RN.

## 2014-08-12 NOTE — Discharge Instructions (Signed)
Pyelonephritis, Adult °Pyelonephritis is a kidney infection. In general, there are 2 main types of pyelonephritis: °· Infections that come on quickly without any warning (acute pyelonephritis). °· Infections that persist for a long period of time (chronic pyelonephritis). °CAUSES  °Two main causes of pyelonephritis are: °· Bacteria traveling from the bladder to the kidney. This is a problem especially in pregnant women. The urine in the bladder can become filled with bacteria from multiple causes, including: °¨ Inflammation of the prostate gland (prostatitis). °¨ Sexual intercourse in females. °¨ Bladder infection (cystitis). °· Bacteria traveling from the bloodstream to the tissue part of the kidney. °Problems that may increase your risk of getting a kidney infection include: °· Diabetes. °· Kidney stones or bladder stones. °· Cancer. °· Catheters placed in the bladder. °· Other abnormalities of the kidney or ureter. °SYMPTOMS  °· Abdominal pain. °· Pain in the side or flank area. °· Fever. °· Chills. °· Upset stomach. °· Blood in the urine (dark urine). °· Frequent urination. °· Strong or persistent urge to urinate. °· Burning or stinging when urinating. °DIAGNOSIS  °Your caregiver may diagnose your kidney infection based on your symptoms. A urine sample may also be taken. °TREATMENT  °In general, treatment depends on how severe the infection is.  °· If the infection is mild and caught early, your caregiver may treat you with oral antibiotics and send you home. °· If the infection is more severe, the bacteria may have gotten into the bloodstream. This will require intravenous (IV) antibiotics and a hospital stay. Symptoms may include: °¨ High fever. °¨ Severe flank pain. °¨ Shaking chills. °· Even after a hospital stay, your caregiver may require you to be on oral antibiotics for a period of time. °· Other treatments may be required depending upon the cause of the infection. °HOME CARE INSTRUCTIONS  °· Take your  antibiotics as directed. Finish them even if you start to feel better. °· Make an appointment to have your urine checked to make sure the infection is gone. °· Drink enough fluids to keep your urine clear or pale yellow. °· Take medicines for the bladder if you have urgency and frequency of urination as directed by your caregiver. °SEEK IMMEDIATE MEDICAL CARE IF:  °· You have a fever or persistent symptoms for more than 2-3 days. °· You have a fever and your symptoms suddenly get worse. °· You are unable to take your antibiotics or fluids. °· You develop shaking chills. °· You experience extreme weakness or fainting. °· There is no improvement after 2 days of treatment. °MAKE SURE YOU: °· Understand these instructions. °· Will watch your condition. °· Will get help right away if you are not doing well or get worse. °Document Released: 08/30/2005 Document Revised: 02/29/2012 Document Reviewed: 02/03/2011 °Exit[employer]® Patient Information ©2015 [employer]. This information is not intended to replace advice given to you by your health care provider. Make sure you discuss any questions you have with your health care provider. ° °

## 2014-08-12 NOTE — Discharge Summary (Signed)
Physician Discharge Summary  Patient ID: Kristi CromeBrittany S Marshall MRN: [id number] DOB/AGE: November 03, 1985 27 y.o.  Admit date: 08/09/2014 Discharge date: 08/12/2014  Admission Diagnoses: 5959w5d  NO PNC Pyelonephritis  Discharge Diagnoses:  Active Problems:   Pyelonephritis affecting pregnancy in second trimester, antepartum   Discharged Condition: good  Hospital Course: unremarkable, excellent response to antibiotics  Consults: None  Significant Diagnostic Studies:   Treatments: antibiotics: ceftriaxone  Discharge Exam: Blood pressure 110/60, pulse 91, temperature 98 F (36.7 C), temperature source Oral, resp. rate 18, height 5\' 4"  (1.626 m), weight 188 lb (85.276 kg), last menstrual period 03/25/2014, SpO2 99 %. General appearance: alert, cooperative and no distress Back: negative, no CVAT GI: soft, non-tender; bowel sounds normal; no masses,  no organomegaly  Disposition: 01-Home or Self Care  Discharge Instructions    Diet - low sodium heart healthy    Complete by:  As directed      Increase activity slowly    Complete by:  As directed             Medication List    TAKE these medications        albuterol 108 (90 BASE) MCG/ACT inhaler  Commonly known as:  PROVENTIL HFA;VENTOLIN HFA  Inhale 2 puffs into the lungs every 6 (six) hours as needed. For shortness of breath     BENADRYL ALLERGY PO  Take 5 mLs by mouth daily as needed (allergies).     cephALEXin 500 MG capsule  Commonly known as:  KEFLEX  Take 1 capsule (500 mg total) by mouth 4 (four) times daily.     cyclobenzaprine 10 MG tablet  Commonly known as:  FLEXERIL  Take 0.5-1 tablets (5-10 mg total) by mouth 2 (two) times daily as needed for muscle spasms.           Follow-up Information    [facility]. Schedule an appointment as soon as possible for a visit in 2 weeks.   Why:  prenatal care   Contact information:   [address] [place]  [phone]      Signed: Lazaro ArmsURE,Alexsus Papadopoulos H 08/12/2014, 7:33 AM

## 2014-08-15 ENCOUNTER — Telehealth: Payer: Self-pay | Admitting: *Deleted

## 2014-08-15 NOTE — Telephone Encounter (Signed)
Called [name] again and informed her our doctor feels she needs to be evaluated at [facility] to make sure pyelonephritis is not worsening.  Explained is evaluation, but could be readmitted. She states she will come once she gets someone to watch her kids for a few hours.

## 2014-08-15 NOTE — Telephone Encounter (Signed)
Kristi Marshall called and left a message that she just got discharged from the hospital 08/12/14 and has made her appointment for 09/04/14.States was told could call and talk to someone because the pain is still bad- states taking tylenol but can only take so much and pain is unbearable. States she knows she will have pain until infection is gone.   Per chart had pyelonephritis, pregnant [redacted] weeks now. Called Kristi Marshall and she states in hospital pain was 5-9 but was taking percocet with better relief. States she went home she is just taking tylenol and is a little more active because she is taking care of her family and the pain is up to a 9 and tylenol isn't relieving it. States not really worse pain, just not getting relief.  Verified she is taking her antibiotic. Informed her i would talk with doctor and call her back.  [facility] [address]. Also has not had anatamy scan. Yet.

## 2014-08-15 NOTE — Telephone Encounter (Signed)
Discussed with Dr. Jolayne Pantheronstant- instructed to have [name] come to [facility]U for evaluation. Called [name] and left message for her to come to [facility]U for evaluation and to call and let me know she got this message.

## 2014-09-04 ENCOUNTER — Encounter: Payer: Medicaid Other | Admitting: Obstetrics and Gynecology

## 2014-09-09 ENCOUNTER — Ambulatory Visit (INDEPENDENT_AMBULATORY_CARE_PROVIDER_SITE_OTHER): Payer: Medicaid Other | Admitting: Family Medicine

## 2014-09-09 ENCOUNTER — Encounter: Payer: Self-pay | Admitting: Family Medicine

## 2014-09-09 VITALS — BP 114/74 | HR 107 | Temp 98.3°F | Wt 191.9 lb

## 2014-09-09 DIAGNOSIS — O0943 Supervision of pregnancy with grand multiparity, third trimester: Secondary | ICD-10-CM

## 2014-09-09 DIAGNOSIS — O09292 Supervision of pregnancy with other poor reproductive or obstetric history, second trimester: Secondary | ICD-10-CM

## 2014-09-09 DIAGNOSIS — O09299 Supervision of pregnancy with other poor reproductive or obstetric history, unspecified trimester: Secondary | ICD-10-CM | POA: Insufficient documentation

## 2014-09-09 LAB — POCT URINALYSIS DIP (DEVICE)
BILIRUBIN URINE: NEGATIVE
Glucose, UA: NEGATIVE mg/dL
KETONES UR: NEGATIVE mg/dL
Leukocytes, UA: NEGATIVE
Nitrite: NEGATIVE
PH: 7.5 (ref 5.0–8.0)
PROTEIN: NEGATIVE mg/dL
SPECIFIC GRAVITY, URINE: 1.02 (ref 1.005–1.030)
Urobilinogen, UA: 0.2 mg/dL (ref 0.0–1.0)

## 2014-09-09 MED ORDER — ASPIRIN EC 81 MG PO TBEC: 81.0000 mg | DELAYED_RELEASE_TABLET | Freq: Every day | ORAL | Status: DC

## 2014-09-09 MED ORDER — CYCLOBENZAPRINE HCL 5 MG PO TABS: 5.0000 mg | ORAL_TABLET | Freq: Three times a day (TID) | ORAL | Status: DC | PRN

## 2014-09-09 NOTE — Progress Notes (Signed)
Initial visit. Some initial labs already drawn.  C/o of intermittent pelvic pressure.

## 2014-09-09 NOTE — Progress Notes (Signed)
Subjective:    Belva CromeBrittany S Rayborn is a [id number] 3050w5d being seen today for her first obstetrical visit.  Her obstetrical history is significant for pre-eclampsia. Patient unsure intend to breast feed. Pregnancy history fully reviewed.  Hx of pyelonephritis current pregnancy  Patient reports backache, heartburn and nausea.  Filed Vitals:   09/09/14 1507  BP: 114/74  Pulse: 107  Temp: 98.3 F (36.8 C)  Weight: 191 lb 14.4 oz (87.045 kg)    HISTORY: OB History  Gravida Para Term Preterm AB SAB TAB Ectopic Multiple Living  8 4 4  0 3 0 3 0 0 4    # Outcome Date GA Lbr Len/2nd Weight Sex Delivery Anes PTL Lv  8 Current           7 TAB 07/2013             Comments: System Generated. Please review and update pregnancy details.  6 Term 05/19/12 5532w3d 178:06 / 00:26 6 lb 12.8 oz (3.085 kg) Genella MechM Vag-Spont EPI  Y  5 TAB 2010          4 Term 11/21/07 2625w0d  6 lb 8 oz (2.948 kg) F Vag-Spont None N Y  3 TAB 2008          2 Term 03/04/05 6125w0d  7 lb 9 oz (3.43 kg) F Vag-Spont None N Y  1 Term 11/02/03   6 lb 9 oz (2.977 kg) F Vag-Spont None Y Y     Comments: Pre-eclampsia, pt was induced.      Past Medical History  Diagnosis Date  . Anxiety   . PIH (pregnancy induced hypertension)   . Bronchitis   . Seasonal allergies   . Pregnancy induced hypertension   . Seizure     pseudoseizures; stress related  . Pyelonephritis    Past Surgical History  Procedure Laterality Date  . Abortions    . Dilation and curettage of uterus      ? retained POC    Family History  Problem Relation Age of Onset  . Hypertension Mother   . Diabetes Maternal Aunt   . Hypertension Maternal Grandmother   . Diabetes Maternal Grandmother   . Heart disease Maternal Grandmother   . Hypertension Maternal Grandfather   . Heart disease Maternal Grandfather   . Anesthesia problems Neg Hx   . Hypotension Neg Hx   . Malignant hyperthermia Neg Hx   . Pseudochol deficiency Neg Hx   . Other Neg Hx   . Hearing loss  Neg Hx      Exam     Skin: normal coloration and turgor, no rashes    Neurologic: oriented, normal   Extremities: normal strength, tone, and muscle mass   HEENT PERRLA   Mouth/Teeth mucous membranes moist, pharynx normal without lesions   Neck supple   Cardiovascular: Regular rate   Respiratory:  appears well, vitals normal, no respiratory distress, acyanotic, normal RR, ear and throat exam is normal, neck free of mass or lymphadenopathy, chest clear, no wheezing, crepitations, rhonchi, normal symmetric air entry   Abdomen: soft, non-tender; bowel sounds normal; no masses,  no organomegaly      Assessment:    Pregnancy: [id number] Patient Active Problem List   Diagnosis Date Noted  . Pyelonephritis affecting pregnancy in second trimester, antepartum 08/09/2014  . Pyelonephritis 03/22/2014  . Menorrhagia 07/31/2012  . Dysmenorrhea 07/31/2012  . Single episode of elevated blood pressure 07/31/2012  . Seizure-like activity 06/20/2012  . THYROMEGALY 12/17/2009  .  ALLERGIC RHINITIS 12/17/2009  . TOBACCO USER 05/24/2009  . SEIZURE DISORDER 12/20/2008        Plan:     Initial labs drawn. Prenatal vitamins. Problem list reviewed and updated. Genetic Screening discussed quad: requested.  Ultrasound discussed; fetal survey: requested.  Follow up in 4 weeks. 50% of 25 min visit spent on counseling and coordination of care.  Pap: declines today, needs pap as last pap (normal) 10/2011 ASA 81mg  daily rx flexeril for muscle spasm   Glema Takaki ROCIO 09/09/2014

## 2014-09-10 LAB — PRENATAL PROFILE (SOLSTAS)
Antibody Screen: NEGATIVE
Basophils Absolute: 0 K/uL (ref 0.0–0.1)
Basophils Relative: 0 % (ref 0–1)
Eosinophils Absolute: 0.4 K/uL (ref 0.0–0.7)
Eosinophils Relative: 3 % (ref 0–5)
HCT: 33.6 % — ABNORMAL LOW (ref 36.0–46.0)
HIV 1&2 Ab, 4th Generation: NONREACTIVE
Hemoglobin: 11.1 g/dL — ABNORMAL LOW (ref 12.0–15.0)
Hepatitis B Surface Ag: NEGATIVE
Lymphocytes Relative: 23 % (ref 12–46)
Lymphs Abs: 2.9 K/uL (ref 0.7–4.0)
MCH: 31.2 pg (ref 26.0–34.0)
MCHC: 33 g/dL (ref 30.0–36.0)
MCV: 94.4 fL (ref 78.0–100.0)
MPV: 9.9 fL (ref 9.4–12.4)
Monocytes Absolute: 0.6 K/uL (ref 0.1–1.0)
Monocytes Relative: 5 % (ref 3–12)
Neutro Abs: 8.8 K/uL — ABNORMAL HIGH (ref 1.7–7.7)
Neutrophils Relative %: 69 % (ref 43–77)
Platelets: 300 K/uL (ref 150–400)
RBC: 3.56 MIL/uL — ABNORMAL LOW (ref 3.87–5.11)
RDW: 13.5 % (ref 11.5–15.5)
Rh Type: POSITIVE
Rubella: 1.27 {index} — ABNORMAL HIGH (ref <0.90)
WBC: 12.8 K/uL — ABNORMAL HIGH (ref 4.0–10.5)

## 2014-09-10 LAB — AFP, QUAD SCREEN
AFP: 80.9 ng/mL
Age Alone: 1:941 {titer}
Curr Gest Age: 22.5 wks.days
Down Syndrome Scr Risk Est: 1:836 {titer}
HCG, Total: 14.77 [IU]/mL
INH: 298.4 pg/mL
Interpretation-AFP: NEGATIVE
MoM for AFP: 1.03
MoM for INH: 1.48
MoM for hCG: 1
Open Spina bifida: NEGATIVE
Osb Risk: 1:23800 {titer}
Tri 18 Scr Risk Est: NEGATIVE
Trisomy 18 (Edward) Syndrome Interp.: 1:3450 {titer}
uE3 Mom: 0.5
uE3 Value: 1.28 ng/mL

## 2014-09-11 LAB — HEMOGLOBINOPATHY EVALUATION
HGB A: 97.4 % (ref 96.8–97.8)
Hemoglobin Other: 0 %
Hgb A2 Quant: 2.6 % (ref 2.2–3.2)
Hgb F Quant: 0 % (ref 0.0–2.0)
Hgb S Quant: 0 %

## 2014-09-11 LAB — CULTURE, OB URINE
COLONY COUNT: NO GROWTH
ORGANISM ID, BACTERIA: NO GROWTH

## 2014-09-12 LAB — OPIATES/OPIOIDS (LC/MS-MS)
CODEINE URINE: NEGATIVE ng/mL (ref <50)
HYDROMORPHONE: NEGATIVE ng/mL (ref <50)
Hydrocodone: NEGATIVE ng/mL (ref <50)
Morphine Urine: 209 ng/mL — AB (ref <50)
NORHYDROCODONE, UR: NEGATIVE ng/mL (ref <50)
NOROXYCODONE, UR: 2534 ng/mL — AB (ref <50)
Oxycodone, ur: 1099 ng/mL — AB (ref <50)
Oxymorphone: 2567 ng/mL — AB (ref <50)

## 2014-09-12 LAB — OXYCODONE, URINE (LC/MS-MS)
Noroxycodone, Ur: 2534 ng/mL — AB (ref <50)
Oxycodone, ur: 1099 ng/mL — AB (ref <50)
Oxymorphone: 2567 ng/mL — AB (ref <50)

## 2014-09-12 LAB — BENZODIAZEPINES (GC/LC/MS), URINE
Alprazolam metabolite (GC/LC/MS), ur confirm: NEGATIVE ng/mL (ref <25)
Clonazepam metabolite (GC/LC/MS), ur confirm: NEGATIVE ng/mL (ref <25)
Flurazepam metabolite (GC/LC/MS), ur confirm: NEGATIVE ng/mL (ref <50)
Lorazepam (GC/LC/MS), ur confirm: NEGATIVE ng/mL (ref <50)
Midazolam (GC/LC/MS), ur confirm: NEGATIVE ng/mL (ref <50)
Nordiazepam (GC/LC/MS), ur confirm: 182 ng/mL — AB (ref <50)
OXAZEPAMU: 275 ng/mL — AB (ref <50)
TEMAZEPAMU: 399 ng/mL — AB (ref <50)
Triazolam metabolite (GC/LC/MS), ur confirm: NEGATIVE ng/mL (ref <50)

## 2014-09-13 NOTE — L&D Delivery Note (Signed)
Delivery Note At 11:37 AM a viable female was delivered via  (Presentation: ROA).  APGAR: 7, 7, 9; weight Pending.   Placenta status: Spontaneous spontaneous, intact.  Cord:  with the following complications: None.  Cord pH: NA  Moderate MSF noted at delivery. Infant vigorous w/ stimulation, but would become dusky when stimulation ceased. Mouth bulb suctioned. To warmer. NICU called. Baby to CN.   UDS resulted immediately after delivery. Pos cocaine and Benzos. Nursery notified.   Anesthesia:  Epidural Episiotomy:  None Lacerations:  none Suture Repair: NA Est. Blood Loss (mL):    Mom to postpartum.  Baby to Nursery. Placenta to: BS Feeding: Bottle Circ: NA Contraception: Interval BTL.  Dorathy KinsmanSMITH, Rashika Bettes 01/09/2015, 12:05 PM

## 2014-09-16 ENCOUNTER — Encounter: Payer: Self-pay | Admitting: *Deleted

## 2014-09-17 LAB — PRESCRIPTION MONITORING PROFILE (19 PANEL)
Amphetamine/Meth: NEGATIVE ng/mL
BARBITURATE SCREEN, URINE: NEGATIVE ng/mL
Buprenorphine, Urine: NEGATIVE ng/mL
CANNABINOID SCRN UR: NEGATIVE ng/mL
CREATININE, URINE: 48.31 mg/dL (ref >20.0)
Carisoprodol, Urine: NEGATIVE ng/mL
Cocaine Metabolites: NEGATIVE ng/mL
Fentanyl, Ur: NEGATIVE ng/mL
MDMA URINE: NEGATIVE ng/mL
METHADONE SCREEN, URINE: NEGATIVE ng/mL
Meperidine, Ur: NEGATIVE ng/mL
Methaqualone: NEGATIVE ng/mL
Nitrites, Initial: NEGATIVE ug/mL
PHENCYCLIDINE, UR: NEGATIVE ng/mL
Propoxyphene: NEGATIVE ng/mL
Tapentadol, urine: NEGATIVE ng/mL
Tramadol Scrn, Ur: NEGATIVE ng/mL
Zolpidem, Urine: NEGATIVE ng/mL
pH, Initial: 7.7 pH (ref 4.5–8.9)

## 2014-09-18 ENCOUNTER — Ambulatory Visit (HOSPITAL_COMMUNITY)
Admission: RE | Admit: 2014-09-18 | Discharge: 2014-09-18 | Disposition: A | Discharge status: Home / Self Care | Payer: Medicaid Other | Source: Ambulatory Visit | Attending: Family Medicine | Admitting: Family Medicine

## 2014-09-18 DIAGNOSIS — O0943 Supervision of pregnancy with grand multiparity, third trimester: Secondary | ICD-10-CM

## 2014-09-18 DIAGNOSIS — O09292 Supervision of pregnancy with other poor reproductive or obstetric history, second trimester: Secondary | ICD-10-CM | POA: Insufficient documentation

## 2014-09-18 DIAGNOSIS — Z3A24 24 weeks gestation of pregnancy: Secondary | ICD-10-CM | POA: Insufficient documentation

## 2014-09-18 DIAGNOSIS — Z36 Encounter for antenatal screening of mother: Secondary | ICD-10-CM | POA: Insufficient documentation

## 2014-09-18 DIAGNOSIS — Z3689 Encounter for other specified antenatal screening: Secondary | ICD-10-CM | POA: Insufficient documentation

## 2014-10-07 ENCOUNTER — Encounter: Payer: Medicaid Other | Admitting: Advanced Practice Midwife

## 2014-10-07 ENCOUNTER — Encounter: Payer: Self-pay | Admitting: Obstetrics & Gynecology

## 2014-11-12 ENCOUNTER — Encounter: Payer: Self-pay | Admitting: General Practice

## 2014-12-10 ENCOUNTER — Inpatient Hospital Stay (HOSPITAL_COMMUNITY)
Admission: AD | Admit: 2014-12-10 | Discharge: 2014-12-10 | Disposition: A | Discharge status: Home / Self Care | Payer: Medicaid Other | Source: Ambulatory Visit | Attending: Family Medicine | Admitting: Family Medicine

## 2014-12-10 ENCOUNTER — Encounter (HOSPITAL_COMMUNITY): Payer: Self-pay | Admitting: *Deleted

## 2014-12-10 ENCOUNTER — Telehealth: Payer: Self-pay | Admitting: General Practice

## 2014-12-10 DIAGNOSIS — N949 Unspecified condition associated with female genital organs and menstrual cycle: Secondary | ICD-10-CM

## 2014-12-10 DIAGNOSIS — O4703 False labor before 37 completed weeks of gestation, third trimester: Secondary | ICD-10-CM

## 2014-12-10 DIAGNOSIS — Z3A37 37 weeks gestation of pregnancy: Secondary | ICD-10-CM | POA: Diagnosis not present

## 2014-12-10 DIAGNOSIS — O99891 Other specified diseases and conditions complicating pregnancy: Secondary | ICD-10-CM

## 2014-12-10 DIAGNOSIS — R1030 Lower abdominal pain, unspecified: Secondary | ICD-10-CM | POA: Diagnosis not present

## 2014-12-10 DIAGNOSIS — O9989 Other specified diseases and conditions complicating pregnancy, childbirth and the puerperium: Secondary | ICD-10-CM | POA: Diagnosis not present

## 2014-12-10 DIAGNOSIS — M549 Dorsalgia, unspecified: Secondary | ICD-10-CM

## 2014-12-10 DIAGNOSIS — R1084 Generalized abdominal pain: Secondary | ICD-10-CM

## 2014-12-10 LAB — URINALYSIS, ROUTINE W REFLEX MICROSCOPIC
Bilirubin Urine: NEGATIVE
Glucose, UA: NEGATIVE mg/dL
Hgb urine dipstick: NEGATIVE
KETONES UR: NEGATIVE mg/dL
Leukocytes, UA: NEGATIVE
NITRITE: NEGATIVE
PH: 5.5 (ref 5.0–8.0)
PROTEIN: NEGATIVE mg/dL
SPECIFIC GRAVITY, URINE: 1.01 (ref 1.005–1.030)
UROBILINOGEN UA: 0.2 mg/dL (ref 0.0–1.0)

## 2014-12-10 NOTE — MAU Provider Note (Signed)
History     CSN: [id number]  Arrival date and time: 12/10/14 1819   None     Chief Complaint  Patient presents with  . Abdominal Pain   HPI  Patient is 29 y.o. [id number] 8558w6d here with complaints of abdominal pain and back pain.  Has not tried anything, no alleviating factors.  Usually worse with standing or activity.  +FM, denies LOF, VB, contractions, vaginal discharge.   Past Medical History  Diagnosis Date  . Anxiety   . PIH (pregnancy induced hypertension)   . Bronchitis   . Seasonal allergies   . Pregnancy induced hypertension   . Seizure     pseudoseizures; stress related  . Pyelonephritis     Past Surgical History  Procedure Laterality Date  . Abortions    . Dilation and curettage of uterus      ? retained POC     Family History  Problem Relation Age of Onset  . Hypertension Mother   . Diabetes Maternal Aunt   . Hypertension Maternal Grandmother   . Diabetes Maternal Grandmother   . Heart disease Maternal Grandmother   . Hypertension Maternal Grandfather   . Heart disease Maternal Grandfather   . Anesthesia problems Neg Hx   . Hypotension Neg Hx   . Malignant hyperthermia Neg Hx   . Pseudochol deficiency Neg Hx   . Other Neg Hx   . Hearing loss Neg Hx     History  Substance Use Topics  . Smoking status: Current Some Day Smoker -- 0.25 packs/day for 7 years    Types: Cigarettes  . Smokeless tobacco: Never Used  . Alcohol Use: No    Allergies:  Allergies  Allergen Reactions  . Bactrim [Sulfamethoxazole-Trimethoprim] Nausea And Vomiting and Other (See Comments)    high fevers.   . Ibuprofen Itching    Prescriptions prior to admission  Medication Sig Dispense Refill Last Dose  . acetaminophen (TYLENOL) 500 MG tablet Take 1,000 mg by mouth every 6 (six) hours as needed for mild pain.   12/09/2014 at Unknown time  . aspirin EC 81 MG tablet Take 1 tablet (81 mg total) by mouth daily. 30 tablet 5 Past Week at Unknown time  . diphenhydrAMINE  (BENADRYL) 25 MG tablet Take 25 mg by mouth every 6 (six) hours as needed for allergies.   Past Week at Unknown time  . Prenatal Vit-Fe Fumarate-FA (PRENATAL MULTIVITAMIN) TABS tablet Take 1 tablet by mouth daily at 12 noon.   12/10/2014 at Unknown time  . albuterol (PROVENTIL HFA;VENTOLIN HFA) 108 (90 BASE) MCG/ACT inhaler Inhale 2 puffs into the lungs every 6 (six) hours as needed. For shortness of breath 1 Inhaler 2 Rescue  . cephALEXin (KEFLEX) 500 MG capsule Take 1 capsule (500 mg total) by mouth 4 (four) times daily. (Patient not taking: Reported on 09/09/2014) 40 capsule 0 Completed Course at Unknown time  . cyclobenzaprine (FLEXERIL) 10 MG tablet Take 0.5-1 tablets (5-10 mg total) by mouth 2 (two) times daily as needed for muscle spasms. (Patient not taking: Reported on 09/09/2014) 20 tablet 0 Not Taking at Unknown time  . cyclobenzaprine (FLEXERIL) 5 MG tablet Take 1 tablet (5 mg total) by mouth 3 (three) times daily as needed for muscle spasms. (Patient not taking: Reported on 12/10/2014) 30 tablet 2 Not Taking at Unknown time    Review of Systems  Constitutional: Negative for fever and chills.  Respiratory: Negative for cough and shortness of breath.   Cardiovascular: Negative for chest pain  and leg swelling.  Gastrointestinal: Negative for heartburn, nausea, vomiting and diarrhea.  Genitourinary: Negative for dysuria, urgency, frequency and hematuria.  Musculoskeletal: Positive for back pain. Negative for falls.  Neurological:       No headache   Physical Exam   Blood pressure 115/71, pulse 97, temperature 98.2 F (36.8 C), temperature source Oral, resp. rate 18, height 5' 5.5" (1.664 m), weight 194 lb (87.998 kg), last menstrual period 03/25/2014.  Physical Exam  Constitutional: She is oriented to person, place, and time. She appears well-developed and well-nourished.  HENT:  Head: Normocephalic and atraumatic.  Eyes: Conjunctivae and EOM are normal.  Neck: Normal range of  motion.  Cardiovascular: Normal rate.   Respiratory: Effort normal. No respiratory distress.  GI: Soft. Bowel sounds are normal. She exhibits no distension. There is no tenderness.  Musculoskeletal: Normal range of motion. She exhibits tenderness (paraspinal muscle spasm). She exhibits no edema.  Neurological: She is alert and oriented to person, place, and time.  Skin: Skin is warm and dry. No erythema.    MAU Course  Procedures  MDM NST reactive, no contractions  Assessment and Plan  Patient is 29 y.o. [id number] [redacted]w[redacted]d reporting back pain and abdominal pain likely secondary to round ligament pain and muscle spasm - fetal kick counts reinforced - preterm labor precautions - warm compresses, tylenol prn   Kristi Marshall Kristi Marshall 12/10/2014, 8:33 PM

## 2014-12-10 NOTE — Telephone Encounter (Signed)
Patient called and left message stating she is a high risk clinic patient and is 8 months pregnant. Patient states she has a toothache and doesn't know what she should do and where she should go? Called patient, no answer- left message stating we are trying to return your call, please call us back at the clinics

## 2014-12-10 NOTE — MAU Note (Signed)
Pt requesting to go home. Dr. Loreta AveAcosta given report and will come and see patient. Patient also requesting something for pain.

## 2014-12-10 NOTE — Discharge Instructions (Signed)

## 2014-12-10 NOTE — MAU Note (Signed)
C/o "severe" vaginal pressure since last night;

## 2014-12-10 NOTE — MAU Note (Signed)
All this pain and pressure in lower abd, pelvis and back.

## 2014-12-11 ENCOUNTER — Telehealth: Payer: Self-pay | Admitting: *Deleted

## 2014-12-11 NOTE — Telephone Encounter (Signed)
Kristi Marshall called and left a message that she just got off the phone with someone from the health department for dentist office. States they gave her fax number and said that the clinic would need to fax a note that it is ok for them to see her. States the fax number is [phone]. Wants us to call her to let her know what is going on

## 2014-12-11 NOTE — Telephone Encounter (Signed)
Called pt and discussed her concern. She stated that she has a wisdom tooth which needs to be pulled after delivery of baby. Part of the tooth has broken off. She has pain there and also it is sharp and cutting the inside of her mouth. Pt went to [facility] last night for abdominal pain and was told that she can call the HD about her tooth pain. She was given a number on her discharge instructions. She also called and spoke with the after hours nurse last night and was told that [facility]. I advised pt that it would be best to be seen [facility] if possible. We can fax a letter to them regarding permission for care. If she can not get an appt soon, then she can [facility] or [facility] but the only thing they will be able to do is give her antibiotics and/or pain medicine.  Pt voiced understanding and will call back if she needs a dental referral letter faxed to [facility].

## 2014-12-11 NOTE — Telephone Encounter (Signed)
Called patient stating I am returning her phone call. Told patient she may come by the office for that dental letter or she can go to the dentist and sign a ROI and they will fax that to us then we can send the letter to them. Patient verbalized understanding and states she will try to come by the office. Patient states she was told that it may be a couple weeks till she can get in and her tooth really hurts and doesn't know if it is becoming infected or if she needs antibiotics. States lady at the dentist office told her to go to [facility] or [facility]. Told patient she can do that but she may also go to urgent care which is a cost savings to her and she wouldn't have to wait as long. Patient verbalized understanding and was appreciative. Patient had no other questions

## 2014-12-17 ENCOUNTER — Ambulatory Visit (INDEPENDENT_AMBULATORY_CARE_PROVIDER_SITE_OTHER): Payer: Self-pay | Admitting: Obstetrics and Gynecology

## 2014-12-17 VITALS — BP 129/87 | HR 87 | Temp 97.7°F | Wt 193.5 lb

## 2014-12-17 DIAGNOSIS — O09293 Supervision of pregnancy with other poor reproductive or obstetric history, third trimester: Secondary | ICD-10-CM

## 2014-12-17 LAB — POCT URINALYSIS DIP (DEVICE)
Glucose, UA: NEGATIVE mg/dL
Hgb urine dipstick: NEGATIVE
Leukocytes, UA: NEGATIVE
NITRITE: NEGATIVE
PH: 6 (ref 5.0–8.0)
Protein, ur: 30 mg/dL — AB
Urobilinogen, UA: 1 mg/dL (ref 0.0–1.0)

## 2014-12-17 LAB — CBC
HCT: 27.4 % — ABNORMAL LOW (ref 36.0–46.0)
Hemoglobin: 9.3 g/dL — ABNORMAL LOW (ref 12.0–15.0)
MCH: 29.8 pg (ref 26.0–34.0)
MCHC: 33.9 g/dL (ref 30.0–36.0)
MCV: 87.8 fL (ref 78.0–100.0)
MPV: 8.7 fL (ref 8.6–12.4)
Platelets: 372 10*3/uL (ref 150–400)
RBC: 3.12 MIL/uL — ABNORMAL LOW (ref 3.87–5.11)
RDW: 13.7 % (ref 11.5–15.5)
WBC: 10.3 10*3/uL (ref 4.0–10.5)

## 2014-12-17 LAB — RPR

## 2014-12-17 LAB — OB RESULTS CONSOLE GC/CHLAMYDIA
Chlamydia: NEGATIVE
Gonorrhea: NEGATIVE

## 2014-12-17 NOTE — Progress Notes (Signed)
Patient has not been seen since December. 1hr gtt and 28 week labs today along with GBS and cultures.  Reports occasional edema in ankles and right hand.

## 2014-12-17 NOTE — Progress Notes (Signed)
DNKAs. Seen [facility]U 12/10/14. C/o RLP and UCs> discussed relief measures and given labor precautions. Also get UDS and urine culture. Wants PPS> consent signed today.

## 2014-12-17 NOTE — Patient Instructions (Signed)
Third Trimester of Pregnancy The third trimester is from week 29 through week 42, months 7 through 9. The third trimester is a time when the fetus is growing rapidly. At the end of the ninth month, the fetus is about 20 inches in length and weighs 6-10 pounds.  BODY CHANGES Your body goes through many changes during pregnancy. The changes vary from woman to woman.   Your weight will continue to increase. You can expect to gain 25-35 pounds (11-16 kg) by the end of the pregnancy.  You may begin to get stretch marks on your hips, abdomen, and breasts.  You may urinate more often because the fetus is moving lower into your pelvis and pressing on your bladder.  You may develop or continue to have heartburn as a result of your pregnancy.  You may develop constipation because certain hormones are causing the muscles that push waste through your intestines to slow down.  You may develop hemorrhoids or swollen, bulging veins (varicose veins).  You may have pelvic pain because of the weight gain and pregnancy hormones relaxing your joints between the bones in your pelvis. Backaches may result from overexertion of the muscles supporting your posture.  You may have changes in your hair. These can include thickening of your hair, rapid growth, and changes in texture. Some women also have hair loss during or after pregnancy, or hair that feels dry or thin. Your hair will most likely return to normal after your baby is born.  Your breasts will continue to grow and be tender. A yellow discharge may leak from your breasts called colostrum.  Your belly button may stick out.  You may feel short of breath because of your expanding uterus.  You may notice the fetus "dropping," or moving lower in your abdomen.  You may have a bloody mucus discharge. This usually occurs a few days to a week before labor begins.  Your cervix becomes thin and soft (effaced) near your due date. WHAT TO EXPECT AT YOUR PRENATAL  EXAMS  You will have prenatal exams every 2 weeks until week 36. Then, you will have weekly prenatal exams. During a routine prenatal visit:  You will be weighed to make sure you and the fetus are growing normally.  Your blood pressure is taken.  Your abdomen will be measured to track your baby's growth.  The fetal heartbeat will be listened to.  Any test results from the previous visit will be discussed.  You may have a cervical check near your due date to see if you have effaced. At around 36 weeks, your caregiver will check your cervix. At the same time, your caregiver will also perform a test on the secretions of the vaginal tissue. This test is to determine if a type of bacteria, Group B streptococcus, is present. Your caregiver will explain this further. Your caregiver may ask you:  What your birth plan is.  How you are feeling.  If you are feeling the baby move.  If you have had any abnormal symptoms, such as leaking fluid, bleeding, severe headaches, or abdominal cramping.  If you have any questions. Other tests or screenings that may be performed during your third trimester include:  Blood tests that check for low iron levels (anemia).  Fetal testing to check the health, activity level, and growth of the fetus. Testing is done if you have certain medical conditions or if there are problems during the pregnancy. FALSE LABOR You may feel small, irregular contractions that   eventually go away. These are called Braxton Hicks contractions, or false labor. Contractions may last for hours, days, or even weeks before true labor sets in. If contractions come at regular intervals, intensify, or become painful, it is best to be seen by your caregiver.  SIGNS OF LABOR   Menstrual-like cramps.  Contractions that are 5 minutes apart or less.  Contractions that start on the top of the uterus and spread down to the lower abdomen and back.  A sense of increased pelvic pressure or back  pain.  A watery or bloody mucus discharge that comes from the vagina. If you have any of these signs before the 37th week of pregnancy, call your caregiver right away. You need to go to the hospital to get checked immediately. HOME CARE INSTRUCTIONS   Avoid all smoking, herbs, alcohol, and unprescribed drugs. These chemicals affect the formation and growth of the baby.  Follow your caregiver's instructions regarding medicine use. There are medicines that are either safe or unsafe to take during pregnancy.  Exercise only as directed by your caregiver. Experiencing uterine cramps is a good sign to stop exercising.  Continue to eat regular, healthy meals.  Wear a good support bra for breast tenderness.  Do not use hot tubs, steam rooms, or saunas.  Wear your seat belt at all times when driving.  Avoid raw meat, uncooked cheese, cat litter boxes, and soil used by cats. These carry germs that can cause birth defects in the baby.  Take your prenatal vitamins.  Try taking a stool softener (if your caregiver approves) if you develop constipation. Eat more high-fiber foods, such as fresh vegetables or fruit and whole grains. Drink plenty of fluids to keep your urine clear or pale yellow.  Take warm sitz baths to soothe any pain or discomfort caused by hemorrhoids. Use hemorrhoid cream if your caregiver approves.  If you develop varicose veins, wear support hose. Elevate your feet for 15 minutes, 3-4 times a day. Limit salt in your diet.  Avoid heavy lifting, wear low heal shoes, and practice good posture.  Rest a lot with your legs elevated if you have leg cramps or low back pain.  Visit your dentist if you have not gone during your pregnancy. Use a soft toothbrush to brush your teeth and be gentle when you floss.  A sexual relationship may be continued unless your caregiver directs you otherwise.  Do not travel far distances unless it is absolutely necessary and only with the approval  of your caregiver.  Take prenatal classes to understand, practice, and ask questions about the labor and delivery.  Make a trial run to the hospital.  Pack your hospital bag.  Prepare the baby's nursery.  Continue to go to all your prenatal visits as directed by your caregiver. SEEK MEDICAL CARE IF:  You are unsure if you are in labor or if your water has broken.  You have dizziness.  You have mild pelvic cramps, pelvic pressure, or nagging pain in your abdominal area.  You have persistent nausea, vomiting, or diarrhea.  You have a bad smelling vaginal discharge.  You have pain with urination. SEEK IMMEDIATE MEDICAL CARE IF:   You have a fever.  You are leaking fluid from your vagina.  You have spotting or bleeding from your vagina.  You have severe abdominal cramping or pain.  You have rapid weight loss or gain.  You have shortness of breath with chest pain.  You notice sudden or extreme swelling   of your face, hands, ankles, feet, or legs.  You have not felt your baby move in over an hour.  You have severe headaches that do not go away with medicine.  You have vision changes. Document Released: 08/24/2001 Document Revised: 09/04/2013 Document Reviewed: 10/31/2012 Exit[employer] Patient Information 2015 [employer]. This information is not intended to replace advice given to you by your health care provider. Make sure you discuss any questions you have with your health care provider.  

## 2014-12-18 LAB — GLUCOSE TOLERANCE, 1 HOUR (50G) W/O FASTING: GLUCOSE 1 HOUR GTT: 127 mg/dL (ref 70–140)

## 2014-12-18 LAB — GC/CHLAMYDIA PROBE AMP
CT PROBE, AMP APTIMA: NEGATIVE
GC PROBE AMP APTIMA: NEGATIVE

## 2014-12-18 LAB — HIV ANTIBODY (ROUTINE TESTING W REFLEX): HIV 1&2 Ab, 4th Generation: NONREACTIVE

## 2014-12-18 LAB — CULTURE, OB URINE
Colony Count: NO GROWTH
Organism ID, Bacteria: NO GROWTH

## 2014-12-19 LAB — CULTURE, BETA STREP (GROUP B ONLY)

## 2014-12-23 LAB — BENZODIAZEPINES (GC/LC/MS), URINE
ALPRAZOLAMU: NEGATIVE ng/mL (ref <25)
Clonazepam metabolite (GC/LC/MS), ur confirm: NEGATIVE ng/mL (ref <25)
Flurazepam metabolite (GC/LC/MS), ur confirm: NEGATIVE ng/mL (ref <50)
LORAZEPAMU: NEGATIVE ng/mL (ref <50)
MIDAZOLAMU: NEGATIVE ng/mL (ref <50)
Nordiazepam (GC/LC/MS), ur confirm: 185 ng/mL — AB (ref <50)
Oxazepam (GC/LC/MS), ur confirm: 637 ng/mL — AB (ref <50)
TEMAZEPAMU: 1305 ng/mL — AB (ref <50)
TRIAZOLAMU: NEGATIVE ng/mL (ref <50)

## 2014-12-23 LAB — OXYCODONE, URINE (LC/MS-MS)
Oxycodone, ur: 4654 ng/mL — AB (ref <50)
Oxymorphone: 2115 ng/mL — AB (ref <50)

## 2014-12-23 LAB — OPIATES/OPIOIDS (LC/MS-MS)
CODEINE URINE: NEGATIVE ng/mL (ref <50)
Hydrocodone: NEGATIVE ng/mL (ref <50)
Hydromorphone: NEGATIVE ng/mL (ref <50)
Morphine Urine: NEGATIVE ng/mL (ref <50)
NORHYDROCODONE, UR: NEGATIVE ng/mL (ref <50)
Noroxycodone, Ur: >10000 ng/mL — AB (ref <50)
OXYCODONE, UR: 4654 ng/mL — AB (ref <50)
OXYMORPHONE, URINE: 2115 ng/mL — AB (ref <50)

## 2014-12-23 LAB — COCAINE METABOLITE (GC/LC/MS), URINE: Benzoylecgonine GC/MS Conf: >75000 ng/mL — AB (ref <100)

## 2014-12-24 LAB — PRESCRIPTION MONITORING PROFILE (19 PANEL)
Amphetamine/Meth: NEGATIVE ng/mL
BUPRENORPHINE, URINE: NEGATIVE ng/mL
Barbiturate Screen, Urine: NEGATIVE ng/mL
CANNABINOID SCRN UR: NEGATIVE ng/mL
Carisoprodol, Urine: NEGATIVE ng/mL
Creatinine, Urine: 252.94 mg/dL (ref >20.0)
ECSTASY: NEGATIVE ng/mL
FENTANYL URINE: NEGATIVE ng/mL
Meperidine, Ur: NEGATIVE ng/mL
Methadone Screen, Urine: NEGATIVE ng/mL
Methaqualone: NEGATIVE ng/mL
Nitrites, Initial: NEGATIVE ug/mL
Phencyclidine, Ur: NEGATIVE ng/mL
Propoxyphene: NEGATIVE ng/mL
Tapentadol, urine: NEGATIVE ng/mL
Tramadol Scrn, Ur: NEGATIVE ng/mL
ZOLPIDEM, URINE: NEGATIVE ng/mL
pH, Initial: 6.5 pH (ref 4.5–8.9)

## 2014-12-24 NOTE — Progress Notes (Signed)
Opiates positive> address next visit.

## 2014-12-25 ENCOUNTER — Encounter: Payer: Self-pay | Admitting: Physician Assistant

## 2014-12-27 ENCOUNTER — Ambulatory Visit (INDEPENDENT_AMBULATORY_CARE_PROVIDER_SITE_OTHER): Payer: Self-pay | Admitting: Certified Nurse Midwife

## 2014-12-27 VITALS — BP 119/77 | HR 75 | Temp 97.7°F | Wt 196.7 lb

## 2014-12-27 DIAGNOSIS — Z3493 Encounter for supervision of normal pregnancy, unspecified, third trimester: Secondary | ICD-10-CM

## 2014-12-27 DIAGNOSIS — Z3A38 38 weeks gestation of pregnancy: Secondary | ICD-10-CM | POA: Insufficient documentation

## 2014-12-27 LAB — POCT URINALYSIS DIP (DEVICE)
Bilirubin Urine: NEGATIVE
Glucose, UA: NEGATIVE mg/dL
Ketones, ur: NEGATIVE mg/dL
Leukocytes, UA: NEGATIVE
Nitrite: NEGATIVE
Protein, ur: NEGATIVE mg/dL
Specific Gravity, Urine: 1.02 (ref 1.005–1.030)
Urobilinogen, UA: 0.2 mg/dL (ref 0.0–1.0)
pH: 5.5 (ref 5.0–8.0)

## 2014-12-27 NOTE — Progress Notes (Signed)
Trace hgb in urine.  Opiates positive in last urine screen, per Deirdre, need to address today.

## 2014-12-27 NOTE — Patient Instructions (Signed)
Braxton Hicks Contractions °Contractions of the uterus can occur throughout pregnancy. Contractions are not always a sign that you are in labor.  °WHAT ARE BRAXTON HICKS CONTRACTIONS?  °Contractions that occur before labor are called Braxton Hicks contractions, or false labor. Toward the end of pregnancy (32-34 weeks), these contractions can develop more often and may become more forceful. This is not true labor because these contractions do not result in opening (dilatation) and thinning of the cervix. They are sometimes difficult to tell apart from true labor because these contractions can be forceful and people have different pain tolerances. You should not feel embarrassed if you go to the hospital with false labor. Sometimes, the only way to tell if you are in true labor is for your health care provider to look for changes in the cervix. °If there are no prenatal problems or other health problems associated with the pregnancy, it is completely safe to be sent home with false labor and await the onset of true labor. °HOW CAN YOU TELL THE DIFFERENCE BETWEEN TRUE AND FALSE LABOR? °False Labor °· The contractions of false labor are usually shorter and not as hard as those of true labor.   °· The contractions are usually irregular.   °· The contractions are often felt in the front of the lower abdomen and in the groin.   °· The contractions may go away when you walk around or change positions while lying down.   °· The contractions get weaker and are shorter lasting as time goes on.   °· The contractions do not usually become progressively stronger, regular, and closer together as with true labor.   °True Labor °· Contractions in true labor last 30-70 seconds, become very regular, usually become more intense, and increase in frequency.   °· The contractions do not go away with walking.   °· The discomfort is usually felt in the top of the uterus and spreads to the lower abdomen and low back.   °· True labor can be  determined by your health care provider with an exam. This will show that the cervix is dilating and getting thinner.   °WHAT TO REMEMBER °· Keep up with your usual exercises and follow other instructions given by your health care provider.   °· Take medicines as directed by your health care provider.   °· Keep your regular prenatal appointments.   °· Eat and drink lightly if you think you are going into labor.   °· If Braxton Hicks contractions are making you uncomfortable:   °¨ Change your position from lying down or resting to walking, or from walking to resting.   °¨ Sit and rest in a tub of warm water.   °¨ Drink 2-3 glasses of water. Dehydration may cause these contractions.   °¨ Do slow and deep breathing several times an hour.   °WHEN SHOULD I SEEK IMMEDIATE MEDICAL CARE? °Seek immediate medical care if: °· Your contractions become stronger, more regular, and closer together.   °· You have fluid leaking or gushing from your vagina.   °· You have a fever.   °· You pass blood-tinged mucus.   °· You have vaginal bleeding.   °· You have continuous abdominal pain.   °· You have low back pain that you never had before.   °· You feel your baby's head pushing down and causing pelvic pressure.   °· Your baby is not moving as much as it used to.   °Document Released: 08/30/2005 Document Revised: 09/04/2013 Document Reviewed: 06/11/2013 °[employer]® Patient Information ©2015 [employer]. This information is not intended to replace advice given to you by your health care   provider. Make sure you discuss any questions you have with your health care provider. ° °

## 2015-01-01 ENCOUNTER — Inpatient Hospital Stay (HOSPITAL_COMMUNITY)
Admission: AD | Admit: 2015-01-01 | Discharge: 2015-01-01 | Disposition: A | Discharge status: Home / Self Care | Payer: Medicaid Other | Source: Ambulatory Visit | Attending: Obstetrics & Gynecology | Admitting: Obstetrics & Gynecology

## 2015-01-01 ENCOUNTER — Encounter (HOSPITAL_COMMUNITY): Payer: Self-pay

## 2015-01-01 DIAGNOSIS — Z3A39 39 weeks gestation of pregnancy: Secondary | ICD-10-CM | POA: Insufficient documentation

## 2015-01-01 DIAGNOSIS — O9989 Other specified diseases and conditions complicating pregnancy, childbirth and the puerperium: Secondary | ICD-10-CM

## 2015-01-01 DIAGNOSIS — N898 Other specified noninflammatory disorders of vagina: Secondary | ICD-10-CM

## 2015-01-01 LAB — OB RESULTS CONSOLE GBS: GBS: NEGATIVE

## 2015-01-01 LAB — POCT FERN TEST: POCT Fern Test: NEGATIVE

## 2015-01-01 NOTE — MAU Note (Signed)
States she felt a gush of fluid that wet her panties. Has not continued to leak and is not leaking now.

## 2015-01-01 NOTE — MAU Provider Note (Signed)
S: Kristi Marshall is a 29 y.o. [id number] at [id number] who presents today with leaking of fluid. She denies any VB. She confirms fetal movement. O: VSS, afebrile Abdomen: soft, non-tender, gravid External: no lesion Vagina: mod amount of thick, creamy, homogenous white discharge Cervix: pink, smooth, 2.5cm Uterus: AGA Results for orders placed or performed during the hospital encounter of 01/01/15 (from the past 24 hour(s))  OB RESULT CONSOLE Group B Strep     Status: None   Collection Time: 01/01/15 12:00 AM  Result Value Ref Range   GBS Negative   Fern Test     Status: None   Collection Time: 01/01/15  8:07 PM  Result Value Ref Range   POCT Fern Test Negative = intact amniotic membranes    A/P: Exam for ROM RN will report to attending MD

## 2015-01-01 NOTE — Discharge Instructions (Signed)
Braxton Hicks Contractions °Contractions of the uterus can occur throughout pregnancy. Contractions are not always a sign that you are in labor.  °WHAT ARE BRAXTON HICKS CONTRACTIONS?  °Contractions that occur before labor are called Braxton Hicks contractions, or false labor. Toward the end of pregnancy (32-34 weeks), these contractions can develop more often and may become more forceful. This is not true labor because these contractions do not result in opening (dilatation) and thinning of the cervix. They are sometimes difficult to tell apart from true labor because these contractions can be forceful and people have different pain tolerances. You should not feel embarrassed if you go to the hospital with false labor. Sometimes, the only way to tell if you are in true labor is for your health care provider to look for changes in the cervix. °If there are no prenatal problems or other health problems associated with the pregnancy, it is completely safe to be sent home with false labor and await the onset of true labor. °HOW CAN YOU TELL THE DIFFERENCE BETWEEN TRUE AND FALSE LABOR? °False Labor °· The contractions of false labor are usually shorter and not as hard as those of true labor.   °· The contractions are usually irregular.   °· The contractions are often felt in the front of the lower abdomen and in the groin.   °· The contractions may go away when you walk around or change positions while lying down.   °· The contractions get weaker and are shorter lasting as time goes on.   °· The contractions do not usually become progressively stronger, regular, and closer together as with true labor.   °True Labor °1. Contractions in true labor last 30-70 seconds, become very regular, usually become more intense, and increase in frequency.   °2. The contractions do not go away with walking.   °3. The discomfort is usually felt in the top of the uterus and spreads to the lower abdomen and low back.   °4. True labor can  be determined by your health care provider with an exam. This will show that the cervix is dilating and getting thinner.   °WHAT TO REMEMBER °· Keep up with your usual exercises and follow other instructions given by your health care provider.   °· Take medicines as directed by your health care provider.   °· Keep your regular prenatal appointments.   °· Eat and drink lightly if you think you are going into labor.   °· If Braxton Hicks contractions are making you uncomfortable:   °· Change your position from lying down or resting to walking, or from walking to resting.   °· Sit and rest in a tub of warm water.   °· Drink 2-3 glasses of water. Dehydration may cause these contractions.   °· Do slow and deep breathing several times an hour.   °WHEN SHOULD I SEEK IMMEDIATE MEDICAL CARE? °Seek immediate medical care if: °· Your contractions become stronger, more regular, and closer together.   °· You have fluid leaking or gushing from your vagina.   °· You have a fever.   °· You pass blood-tinged mucus.   °· You have vaginal bleeding.   °· You have continuous abdominal pain.   °· You have low back pain that you never had before.   °· You feel your baby's head pushing down and causing pelvic pressure.   °· Your baby is not moving as much as it used to.   °Document Released: 08/30/2005 Document Revised: 09/04/2013 Document Reviewed: 06/11/2013 °[employer]® Patient Information ©2015 [employer]. This information is not intended to replace advice given to you by your health care   provider. Make sure you discuss any questions you have with your health care provider. ° °Fetal Movement Counts °Patient Name: __________________________________________________ Patient Due Date: ____________________ °Performing a fetal movement count is highly recommended in high-risk pregnancies, but it is good for every pregnant woman to do. Your health care provider may ask you to start counting fetal movements at 28 weeks of the pregnancy. Fetal  movements often increase: °· After eating a full meal. °· After physical activity. °· After eating or drinking something sweet or cold. °· At rest. °Pay attention to when you feel the baby is most active. This will help you notice a pattern of your baby's sleep and wake cycles and what factors contribute to an increase in fetal movement. It is important to perform a fetal movement count at the same time each day when your baby is normally most active.  °HOW TO COUNT FETAL MOVEMENTS °5. Find a quiet and comfortable area to sit or lie down on your left side. Lying on your left side provides the best blood and oxygen circulation to your baby. °6. Write down the day and time on a sheet of paper or in a journal. °7. Start counting kicks, flutters, swishes, rolls, or jabs in a 2-hour period. You should feel at least 10 movements within 2 hours. °8. If you do not feel 10 movements in 2 hours, wait 2-3 hours and count again. Look for a change in the pattern or not enough counts in 2 hours. °SEEK MEDICAL CARE IF: °· You feel less than 10 counts in 2 hours, tried twice. °· There is no movement in over an hour. °· The pattern is changing or taking longer each day to reach 10 counts in 2 hours. °· You feel the baby is not moving as he or she usually does. °Date: ____________ Movements: ____________ Start time: ____________ Finish time: ____________  °Date: ____________ Movements: ____________ Start time: ____________ Finish time: ____________ °Date: ____________ Movements: ____________ Start time: ____________ Finish time: ____________ °Date: ____________ Movements: ____________ Start time: ____________ Finish time: ____________ °Date: ____________ Movements: ____________ Start time: ____________ Finish time: ____________ °Date: ____________ Movements: ____________ Start time: ____________ Finish time: ____________ °Date: ____________ Movements: ____________ Start time: ____________ Finish time: ____________ °Date: ____________  Movements: ____________ Start time: ____________ Finish time: ____________  °Date: ____________ Movements: ____________ Start time: ____________ Finish time: ____________ °Date: ____________ Movements: ____________ Start time: ____________ Finish time: ____________ °Date: ____________ Movements: ____________ Start time: ____________ Finish time: ____________ °Date: ____________ Movements: ____________ Start time: ____________ Finish time: ____________ °Date: ____________ Movements: ____________ Start time: ____________ Finish time: ____________ °Date: ____________ Movements: ____________ Start time: ____________ Finish time: ____________ °Date: ____________ Movements: ____________ Start time: ____________ Finish time: ____________  °Date: ____________ Movements: ____________ Start time: ____________ Finish time: ____________ °Date: ____________ Movements: ____________ Start time: ____________ Finish time: ____________ °Date: ____________ Movements: ____________ Start time: ____________ Finish time: ____________ °Date: ____________ Movements: ____________ Start time: ____________ Finish time: ____________ °Date: ____________ Movements: ____________ Start time: ____________ Finish time: ____________ °Date: ____________ Movements: ____________ Start time: ____________ Finish time: ____________ °Date: ____________ Movements: ____________ Start time: ____________ Finish time: ____________  °Date: ____________ Movements: ____________ Start time: ____________ Finish time: ____________ °Date: ____________ Movements: ____________ Start time: ____________ Finish time: ____________ °Date: ____________ Movements: ____________ Start time: ____________ Finish time: ____________ °Date: ____________ Movements: ____________ Start time: ____________ Finish time: ____________ °Date: ____________ Movements: ____________ Start time: ____________ Finish time: ____________ °Date: ____________ Movements: ____________ Start time:  ____________ Finish time: ____________ °Date: ____________ Movements:   ____________ Start time: ____________ Finish time: ____________  °Date: ____________ Movements: ____________ Start time: ____________ Finish time: ____________ °Date: ____________ Movements: ____________ Start time: ____________ Finish time: ____________ °Date: ____________ Movements: ____________ Start time: ____________ Finish time: ____________ °Date: ____________ Movements: ____________ Start time: ____________ Finish time: ____________ °Date: ____________ Movements: ____________ Start time: ____________ Finish time: ____________ °Date: ____________ Movements: ____________ Start time: ____________ Finish time: ____________ °Date: ____________ Movements: ____________ Start time: ____________ Finish time: ____________  °Date: ____________ Movements: ____________ Start time: ____________ Finish time: ____________ °Date: ____________ Movements: ____________ Start time: ____________ Finish time: ____________ °Date: ____________ Movements: ____________ Start time: ____________ Finish time: ____________ °Date: ____________ Movements: ____________ Start time: ____________ Finish time: ____________ °Date: ____________ Movements: ____________ Start time: ____________ Finish time: ____________ °Date: ____________ Movements: ____________ Start time: ____________ Finish time: ____________ °Date: ____________ Movements: ____________ Start time: ____________ Finish time: ____________  °Date: ____________ Movements: ____________ Start time: ____________ Finish time: ____________ °Date: ____________ Movements: ____________ Start time: ____________ Finish time: ____________ °Date: ____________ Movements: ____________ Start time: ____________ Finish time: ____________ °Date: ____________ Movements: ____________ Start time: ____________ Finish time: ____________ °Date: ____________ Movements: ____________ Start time: ____________ Finish time: ____________ °Date:  ____________ Movements: ____________ Start time: ____________ Finish time: ____________ °Date: ____________ Movements: ____________ Start time: ____________ Finish time: ____________  °Date: ____________ Movements: ____________ Start time: ____________ Finish time: ____________ °Date: ____________ Movements: ____________ Start time: ____________ Finish time: ____________ °Date: ____________ Movements: ____________ Start time: ____________ Finish time: ____________ °Date: ____________ Movements: ____________ Start time: ____________ Finish time: ____________ °Date: ____________ Movements: ____________ Start time: ____________ Finish time: ____________ °Date: ____________ Movements: ____________ Start time: ____________ Finish time: ____________ °Document Released: 09/29/2006 Document Revised: 01/14/2014 Document Reviewed: 06/26/2012 °[employer]® Patient Information ©2015 [employer]. This information is not intended to replace advice given to you by your health care provider. Make sure you discuss any questions you have with your health care provider. ° °

## 2015-01-01 NOTE — MAU Note (Signed)
Urine in lab 

## 2015-01-01 NOTE — Progress Notes (Signed)
Notified of pt arrival in MAU, vaginal exam and uterine activity. Will discharge home

## 2015-01-01 NOTE — MAU Note (Signed)
Pt presents complaining of contractions that she has not timed. States they are not painful, just uncomfortable. Denies leaking of fluid. Reports good fetal movement. Some mucous discharge and bloody show.

## 2015-01-01 NOTE — Discharge Instructions (Signed)
Third Trimester of Pregnancy °The third trimester is from week 29 through week 42, months 7 through 9. The third trimester is a time when the fetus is growing rapidly. At the end of the ninth month, the fetus is about 20 inches in length and weighs 6-10 pounds.  °BODY CHANGES °Your body goes through many changes during pregnancy. The changes vary from woman to woman.  °· Your weight will continue to increase. You can expect to gain 25-35 pounds (11-16 kg) by the end of the pregnancy. °· You may begin to get stretch marks on your hips, abdomen, and breasts. °· You may urinate more often because the fetus is moving lower into your pelvis and pressing on your bladder. °· You may develop or continue to have heartburn as a result of your pregnancy. °· You may develop constipation because certain hormones are causing the muscles that push waste through your intestines to slow down. °· You may develop hemorrhoids or swollen, bulging veins (varicose veins). °· You may have pelvic pain because of the weight gain and pregnancy hormones relaxing your joints between the bones in your pelvis. Backaches may result from overexertion of the muscles supporting your posture. °· You may have changes in your hair. These can include thickening of your hair, rapid growth, and changes in texture. Some women also have hair loss during or after pregnancy, or hair that feels dry or thin. Your hair will most likely return to normal after your baby is born. °· Your breasts will continue to grow and be tender. A yellow discharge may leak from your breasts called colostrum. °· Your belly button may stick out. °· You may feel short of breath because of your expanding uterus. °· You may notice the fetus "dropping," or moving lower in your abdomen. °· You may have a bloody mucus discharge. This usually occurs a few days to a week before labor begins. °· Your cervix becomes thin and soft (effaced) near your due date. °WHAT TO EXPECT AT YOUR PRENATAL  EXAMS  °You will have prenatal exams every 2 weeks until week 36. Then, you will have weekly prenatal exams. During a routine prenatal visit: °· You will be weighed to make sure you and the fetus are growing normally. °· Your blood pressure is taken. °· Your abdomen will be measured to track your baby's growth. °· The fetal heartbeat will be listened to. °· Any test results from the previous visit will be discussed. °· You may have a cervical check near your due date to see if you have effaced. °At around 36 weeks, your caregiver will check your cervix. At the same time, your caregiver will also perform a test on the secretions of the vaginal tissue. This test is to determine if a type of bacteria, Group B streptococcus, is present. Your caregiver will explain this further. °Your caregiver may ask you: °· What your birth plan is. °· How you are feeling. °· If you are feeling the baby move. °· If you have had any abnormal symptoms, such as leaking fluid, bleeding, severe headaches, or abdominal cramping. °· If you have any questions. °Other tests or screenings that may be performed during your third trimester include: °· Blood tests that check for low iron levels (anemia). °· Fetal testing to check the health, activity level, and growth of the fetus. Testing is done if you have certain medical conditions or if there are problems during the pregnancy. °FALSE LABOR °You may feel small, irregular contractions that   eventually go away. These are called Braxton Hicks contractions, or false labor. Contractions may last for hours, days, or even weeks before true labor sets in. If contractions come at regular intervals, intensify, or become painful, it is best to be seen by your caregiver.  °SIGNS OF LABOR  °· Menstrual-like cramps. °· Contractions that are 5 minutes apart or less. °· Contractions that start on the top of the uterus and spread down to the lower abdomen and back. °· A sense of increased pelvic pressure or back  pain. °· A watery or bloody mucus discharge that comes from the vagina. °If you have any of these signs before the 37th week of pregnancy, call your caregiver right away. You need to go to the hospital to get checked immediately. °HOME CARE INSTRUCTIONS  °· Avoid all smoking, herbs, alcohol, and unprescribed drugs. These chemicals affect the formation and growth of the baby. °· Follow your caregiver's instructions regarding medicine use. There are medicines that are either safe or unsafe to take during pregnancy. °· Exercise only as directed by your caregiver. Experiencing uterine cramps is a good sign to stop exercising. °· Continue to eat regular, healthy meals. °· Wear a good support bra for breast tenderness. °· Do not use hot tubs, steam rooms, or saunas. °· Wear your seat belt at all times when driving. °· Avoid raw meat, uncooked cheese, cat litter boxes, and soil used by cats. These carry germs that can cause birth defects in the baby. °· Take your prenatal vitamins. °· Try taking a stool softener (if your caregiver approves) if you develop constipation. Eat more high-fiber foods, such as fresh vegetables or fruit and whole grains. Drink plenty of fluids to keep your urine clear or pale yellow. °· Take warm sitz baths to soothe any pain or discomfort caused by hemorrhoids. Use hemorrhoid cream if your caregiver approves. °· If you develop varicose veins, wear support hose. Elevate your feet for 15 minutes, 3-4 times a day. Limit salt in your diet. °· Avoid heavy lifting, wear low heal shoes, and practice good posture. °· Rest a lot with your legs elevated if you have leg cramps or low back pain. °· Visit your dentist if you have not gone during your pregnancy. Use a soft toothbrush to brush your teeth and be gentle when you floss. °· A sexual relationship may be continued unless your caregiver directs you otherwise. °· Do not travel far distances unless it is absolutely necessary and only with the approval  of your caregiver. °· Take prenatal classes to understand, practice, and ask questions about the labor and delivery. °· Make a trial run to the hospital. °· Pack your hospital bag. °· Prepare the baby's nursery. °· Continue to go to all your prenatal visits as directed by your caregiver. °SEEK MEDICAL CARE IF: °· You are unsure if you are in labor or if your water has broken. °· You have dizziness. °· You have mild pelvic cramps, pelvic pressure, or nagging pain in your abdominal area. °· You have persistent nausea, vomiting, or diarrhea. °· You have a bad smelling vaginal discharge. °· You have pain with urination. °SEEK IMMEDIATE MEDICAL CARE IF:  °· You have a fever. °· You are leaking fluid from your vagina. °· You have spotting or bleeding from your vagina. °· You have severe abdominal cramping or pain. °· You have rapid weight loss or gain. °· You have shortness of breath with chest pain. °· You notice sudden or extreme swelling   of your face, hands, ankles, feet, or legs. °· You have not felt your baby move in over an hour. °· You have severe headaches that do not go away with medicine. °· You have vision changes. °Document Released: 08/24/2001 Document Revised: 09/04/2013 Document Reviewed: 10/31/2012 °[employer]® Patient Information ©2015 [employer]. This information is not intended to replace advice given to you by your health care provider. Make sure you discuss any questions you have with your health care provider. °Fetal Movement Counts °Patient Name: __________________________________________________ Patient Due Date: ____________________ °Performing a fetal movement count is highly recommended in high-risk pregnancies, but it is good for every pregnant woman to do. Your health care provider may ask you to start counting fetal movements at 28 weeks of the pregnancy. Fetal movements often increase: °· After eating a full meal. °· After physical activity. °· After eating or drinking something sweet or  cold. °· At rest. °Pay attention to when you feel the baby is most active. This will help you notice a pattern of your baby's sleep and wake cycles and what factors contribute to an increase in fetal movement. It is important to perform a fetal movement count at the same time each day when your baby is normally most active.  °HOW TO COUNT FETAL MOVEMENTS °· Find a quiet and comfortable area to sit or lie down on your left side. Lying on your left side provides the best blood and oxygen circulation to your baby. °· Write down the day and time on a sheet of paper or in a journal. °· Start counting kicks, flutters, swishes, rolls, or jabs in a 2-hour period. You should feel at least 10 movements within 2 hours. °· If you do not feel 10 movements in 2 hours, wait 2-3 hours and count again. Look for a change in the pattern or not enough counts in 2 hours. °SEEK MEDICAL CARE IF: °· You feel less than 10 counts in 2 hours, tried twice. °· There is no movement in over an hour. °· The pattern is changing or taking longer each day to reach 10 counts in 2 hours. °· You feel the baby is not moving as he or she usually does. °Date: ____________ Movements: ____________ Start time: ____________ Finish time: ____________  °Date: ____________ Movements: ____________ Start time: ____________ Finish time: ____________ °Date: ____________ Movements: ____________ Start time: ____________ Finish time: ____________ °Date: ____________ Movements: ____________ Start time: ____________ Finish time: ____________ °Date: ____________ Movements: ____________ Start time: ____________ Finish time: ____________ °Date: ____________ Movements: ____________ Start time: ____________ Finish time: ____________ °Date: ____________ Movements: ____________ Start time: ____________ Finish time: ____________ °Date: ____________ Movements: ____________ Start time: ____________ Finish time: ____________  °Date: ____________ Movements: ____________ Start time:  ____________ Finish time: ____________ °Date: ____________ Movements: ____________ Start time: ____________ Finish time: ____________ °Date: ____________ Movements: ____________ Start time: ____________ Finish time: ____________ °Date: ____________ Movements: ____________ Start time: ____________ Finish time: ____________ °Date: ____________ Movements: ____________ Start time: ____________ Finish time: ____________ °Date: ____________ Movements: ____________ Start time: ____________ Finish time: ____________ °Date: ____________ Movements: ____________ Start time: ____________ Finish time: ____________  °Date: ____________ Movements: ____________ Start time: ____________ Finish time: ____________ °Date: ____________ Movements: ____________ Start time: ____________ Finish time: ____________ °Date: ____________ Movements: ____________ Start time: ____________ Finish time: ____________ °Date: ____________ Movements: ____________ Start time: ____________ Finish time: ____________ °Date: ____________ Movements: ____________ Start time: ____________ Finish time: ____________ °Date: ____________ Movements: ____________ Start time: ____________ Finish time: ____________ °Date: ____________ Movements: ____________ Start time: ____________ Finish time:   ____________  °Date: ____________ Movements: ____________ Start time: ____________ Finish time: ____________ °Date: ____________ Movements: ____________ Start time: ____________ Finish time: ____________ °Date: ____________ Movements: ____________ Start time: ____________ Finish time: ____________ °Date: ____________ Movements: ____________ Start time: ____________ Finish time: ____________ °Date: ____________ Movements: ____________ Start time: ____________ Finish time: ____________ °Date: ____________ Movements: ____________ Start time: ____________ Finish time: ____________ °Date: ____________ Movements: ____________ Start time: ____________ Finish time: ____________  °Date:  ____________ Movements: ____________ Start time: ____________ Finish time: ____________ °Date: ____________ Movements: ____________ Start time: ____________ Finish time: ____________ °Date: ____________ Movements: ____________ Start time: ____________ Finish time: ____________ °Date: ____________ Movements: ____________ Start time: ____________ Finish time: ____________ °Date: ____________ Movements: ____________ Start time: ____________ Finish time: ____________ °Date: ____________ Movements: ____________ Start time: ____________ Finish time: ____________ °Date: ____________ Movements: ____________ Start time: ____________ Finish time: ____________  °Date: ____________ Movements: ____________ Start time: ____________ Finish time: ____________ °Date: ____________ Movements: ____________ Start time: ____________ Finish time: ____________ °Date: ____________ Movements: ____________ Start time: ____________ Finish time: ____________ °Date: ____________ Movements: ____________ Start time: ____________ Finish time: ____________ °Date: ____________ Movements: ____________ Start time: ____________ Finish time: ____________ °Date: ____________ Movements: ____________ Start time: ____________ Finish time: ____________ °Date: ____________ Movements: ____________ Start time: ____________ Finish time: ____________  °Date: ____________ Movements: ____________ Start time: ____________ Finish time: ____________ °Date: ____________ Movements: ____________ Start time: ____________ Finish time: ____________ °Date: ____________ Movements: ____________ Start time: ____________ Finish time: ____________ °Date: ____________ Movements: ____________ Start time: ____________ Finish time: ____________ °Date: ____________ Movements: ____________ Start time: ____________ Finish time: ____________ °Date: ____________ Movements: ____________ Start time: ____________ Finish time: ____________ °Date: ____________ Movements: ____________ Start  time: ____________ Finish time: ____________  °Date: ____________ Movements: ____________ Start time: ____________ Finish time: ____________ °Date: ____________ Movements: ____________ Start time: ____________ Finish time: ____________ °Date: ____________ Movements: ____________ Start time: ____________ Finish time: ____________ °Date: ____________ Movements: ____________ Start time: ____________ Finish time: ____________ °Date: ____________ Movements: ____________ Start time: ____________ Finish time: ____________ °Date: ____________ Movements: ____________ Start time: ____________ Finish time: ____________ °Document Released: 09/29/2006 Document Revised: 01/14/2014 Document Reviewed: 06/26/2012 °[employer]® Patient Information ©2015 [employer]. This information is not intended to replace advice given to you by your health care provider. Make sure you discuss any questions you have with your health care provider. °Braxton Hicks Contractions °Contractions of the uterus can occur throughout pregnancy. Contractions are not always a sign that you are in labor.  °WHAT ARE BRAXTON HICKS CONTRACTIONS?  °Contractions that occur before labor are called Braxton Hicks contractions, or false labor. Toward the end of pregnancy (32-34 weeks), these contractions can develop more often and may become more forceful. This is not true labor because these contractions do not result in opening (dilatation) and thinning of the cervix. They are sometimes difficult to tell apart from true labor because these contractions can be forceful and people have different pain tolerances. You should not feel embarrassed if you go to the hospital with false labor. Sometimes, the only way to tell if you are in true labor is for your health care provider to look for changes in the cervix. °If there are no prenatal problems or other health problems associated with the pregnancy, it is completely safe to be sent home with false labor and await the  onset of true labor. °HOW CAN YOU TELL THE DIFFERENCE BETWEEN TRUE AND FALSE LABOR? °False Labor °· The contractions of false labor are usually shorter and not as hard as those of true labor.   °· The contractions   are usually irregular.   °· The contractions are often felt in the front of the lower abdomen and in the groin.   °· The contractions may go away when you walk around or change positions while lying down.   °· The contractions get weaker and are shorter lasting as time goes on.   °· The contractions do not usually become progressively stronger, regular, and closer together as with true labor.   °True Labor °· Contractions in true labor last 30-70 seconds, become very regular, usually become more intense, and increase in frequency.   °· The contractions do not go away with walking.   °· The discomfort is usually felt in the top of the uterus and spreads to the lower abdomen and low back.   °· True labor can be determined by your health care provider with an exam. This will show that the cervix is dilating and getting thinner.   °WHAT TO REMEMBER °· Keep up with your usual exercises and follow other instructions given by your health care provider.   °· Take medicines as directed by your health care provider.   °· Keep your regular prenatal appointments.   °· Eat and drink lightly if you think you are going into labor.   °· If Braxton Hicks contractions are making you uncomfortable:   °· Change your position from lying down or resting to walking, or from walking to resting.   °· Sit and rest in a tub of warm water.   °· Drink 2-3 glasses of water. Dehydration may cause these contractions.   °· Do slow and deep breathing several times an hour.   °WHEN SHOULD I SEEK IMMEDIATE MEDICAL CARE? °Seek immediate medical care if: °· Your contractions become stronger, more regular, and closer together.   °· You have fluid leaking or gushing from your vagina.   °· You have a fever.   °· You pass blood-tinged mucus.    °· You have vaginal bleeding.   °· You have continuous abdominal pain.   °· You have low back pain that you never had before.   °· You feel your baby's head pushing down and causing pelvic pressure.   °· Your baby is not moving as much as it used to.   °Document Released: 08/30/2005 Document Revised: 09/04/2013 Document Reviewed: 06/11/2013 °[employer]® Patient Information ©2015 [employer]. This information is not intended to replace advice given to you by your health care provider. Make sure you discuss any questions you have with your health care provider. ° °

## 2015-01-05 ENCOUNTER — Inpatient Hospital Stay (HOSPITAL_COMMUNITY)
Admission: AD | Admit: 2015-01-05 | Discharge: 2015-01-05 | Disposition: A | Discharge status: Home / Self Care | Payer: Medicaid Other | Source: Ambulatory Visit | Attending: Obstetrics & Gynecology | Admitting: Obstetrics & Gynecology

## 2015-01-05 ENCOUNTER — Encounter (HOSPITAL_COMMUNITY): Payer: Self-pay

## 2015-01-05 DIAGNOSIS — O99333 Smoking (tobacco) complicating pregnancy, third trimester: Secondary | ICD-10-CM | POA: Diagnosis not present

## 2015-01-05 DIAGNOSIS — R51 Headache: Secondary | ICD-10-CM | POA: Diagnosis present

## 2015-01-05 DIAGNOSIS — O9989 Other specified diseases and conditions complicating pregnancy, childbirth and the puerperium: Secondary | ICD-10-CM | POA: Diagnosis not present

## 2015-01-05 DIAGNOSIS — Z3A39 39 weeks gestation of pregnancy: Secondary | ICD-10-CM | POA: Insufficient documentation

## 2015-01-05 DIAGNOSIS — G43909 Migraine, unspecified, not intractable, without status migrainosus: Secondary | ICD-10-CM | POA: Insufficient documentation

## 2015-01-05 DIAGNOSIS — F17210 Nicotine dependence, cigarettes, uncomplicated: Secondary | ICD-10-CM | POA: Diagnosis not present

## 2015-01-05 DIAGNOSIS — Z3A38 38 weeks gestation of pregnancy: Secondary | ICD-10-CM

## 2015-01-05 HISTORY — DX: Headache: R51

## 2015-01-05 HISTORY — DX: Headache, unspecified: R51.9

## 2015-01-05 LAB — URINALYSIS, ROUTINE W REFLEX MICROSCOPIC
BILIRUBIN URINE: NEGATIVE
Glucose, UA: NEGATIVE mg/dL
Hgb urine dipstick: NEGATIVE
KETONES UR: NEGATIVE mg/dL
Leukocytes, UA: NEGATIVE
Nitrite: NEGATIVE
Protein, ur: NEGATIVE mg/dL
Specific Gravity, Urine: 1.01 (ref 1.005–1.030)
Urobilinogen, UA: 0.2 mg/dL (ref 0.0–1.0)
pH: 5.5 (ref 5.0–8.0)

## 2015-01-05 MED ORDER — OXYCODONE-ACETAMINOPHEN 2.5-325 MG PO TABS: 2.0000 | ORAL_TABLET | ORAL | Status: DC | PRN

## 2015-01-05 MED ORDER — OXYCODONE-ACETAMINOPHEN 5-325 MG PO TABS
2.0000 | ORAL_TABLET | Freq: Once | ORAL | Status: AC
  Administered 2015-01-05: 2 via ORAL
  Filled 2015-01-05: qty 2

## 2015-01-05 NOTE — MAU Provider Note (Signed)
History   [id number] @ 39 wks with hx of migraine headaches in with frontal headache, light sensitive, since yesterday. Tylenol does not help.  CSN: [id number]  Arrival date and time: 01/05/15 1234   None     No chief complaint on file.  HPI  OB History    Gravida Para Term Preterm AB TAB SAB Ectopic Multiple Living   0 3 3 0 0 0 4      Past Medical History  Diagnosis Date  . Anxiety   . PIH (pregnancy induced hypertension)   . Bronchitis   . Seasonal allergies   . Pregnancy induced hypertension   . Seizure     pseudoseizures; stress related  . Pyelonephritis     Past Surgical History  Procedure Laterality Date  . Abortions    . Dilation and curettage of uterus      ? retained POC     Family History  Problem Relation Age of Onset  . Hypertension Mother   . Diabetes Maternal Aunt   . Hypertension Maternal Grandmother   . Diabetes Maternal Grandmother   . Heart disease Maternal Grandmother   . Hypertension Maternal Grandfather   . Heart disease Maternal Grandfather   . Anesthesia problems Neg Hx   . Hypotension Neg Hx   . Malignant hyperthermia Neg Hx   . Pseudochol deficiency Neg Hx   . Other Neg Hx   . Hearing loss Neg Hx     History  Substance Use Topics  . Smoking status: Current Some Day Smoker -- 0.25 packs/day for 7 years    Types: Cigarettes  . Smokeless tobacco: Never Used  . Alcohol Use: No    Allergies:  Allergies  Allergen Reactions  . Bactrim [Sulfamethoxazole-Trimethoprim] Nausea And Vomiting and Other (See Comments)    high fevers.   . Ibuprofen Itching    Prescriptions prior to admission  Medication Sig Dispense Refill Last Dose  . acetaminophen (TYLENOL) 500 MG tablet Take 1,000 mg by mouth every 6 (six) hours as needed for mild pain.   01/05/2015 at Unknown time  . albuterol (PROVENTIL HFA;VENTOLIN HFA) 108 (90 BASE) MCG/ACT inhaler Inhale 2 puffs into the lungs every 6 (six) hours as needed. For shortness of breath 1  Inhaler 2 rescue at Unknown time  . Prenatal Vit-Fe Fumarate-FA (PRENATAL MULTIVITAMIN) TABS tablet Take 1 tablet by mouth daily at 12 noon.   01/04/2015 at Unknown time  . aspirin EC 81 MG tablet Take 1 tablet (81 mg total) by mouth daily. (Patient not taking: Reported on 01/05/2015) 30 tablet 5 12/31/2014 at Unknown time    Review of Systems  Constitutional: Negative.   Eyes: Negative.   Respiratory: Negative.   Cardiovascular: Negative.   Gastrointestinal: Negative.   Genitourinary: Negative.   Musculoskeletal: Negative.   Skin: Negative.   Neurological: Positive for headaches.  Endo/Heme/Allergies: Negative.   Psychiatric/Behavioral: Negative.    Physical Exam   Blood pressure 121/78, pulse 101, temperature 98.4 F (36.9 C), temperature source Oral, resp. rate 18, height 5' 5.5" (1.664 m), weight 203 lb (92.08 kg), last menstrual period 03/25/2014, SpO2 99 %.  Physical Exam  Constitutional: She is oriented to person, place, and time. She appears well-developed and well-nourished.  HENT:  Head: Normocephalic.  Eyes: Pupils are equal, round, and reactive to light.  Neck: Normal range of motion.  Cardiovascular: Normal rate, regular rhythm, normal heart sounds and intact distal pulses.   Respiratory: Effort normal and breath sounds normal.  GI: Soft. Bowel sounds are normal.  Genitourinary: Vagina normal and uterus normal.  Musculoskeletal: Normal range of motion.  Neurological: She is alert and oriented to person, place, and time. She has normal reflexes.  Skin: Skin is warm and dry.  Psychiatric: She has a normal mood and affect. Her behavior is normal. Judgment and thought content normal.    MAU Course  Procedures  MDM migraine  headache  Assessment and Plan  Percocet and d/c home if improves  LAWSON, MARIE DARLENE 01/05/2015, 1:51 PM

## 2015-01-05 NOTE — MAU Note (Signed)
Pt states here for headache since yesterday am around 0900. Frontal headache. Took extra strength tylenol around 0300 today. Has been 2-3cm dilated. Mucus like d/c.

## 2015-01-05 NOTE — Discharge Instructions (Signed)

## 2015-01-05 NOTE — Progress Notes (Signed)
Clydie BraunKaren, RN answering for Agustin CreeDarlene who is in delivery. Notified of two patients in MAU. Will return call.

## 2015-01-06 ENCOUNTER — Ambulatory Visit (INDEPENDENT_AMBULATORY_CARE_PROVIDER_SITE_OTHER): Payer: Self-pay | Admitting: Obstetrics and Gynecology

## 2015-01-06 VITALS — BP 136/75 | HR 105 | Wt 199.3 lb

## 2015-01-06 DIAGNOSIS — O09293 Supervision of pregnancy with other poor reproductive or obstetric history, third trimester: Secondary | ICD-10-CM

## 2015-01-06 LAB — POCT URINALYSIS DIP (DEVICE)
Bilirubin Urine: NEGATIVE
Glucose, UA: NEGATIVE mg/dL
Ketones, ur: NEGATIVE mg/dL
Leukocytes, UA: NEGATIVE
NITRITE: NEGATIVE
Protein, ur: NEGATIVE mg/dL
SPECIFIC GRAVITY, URINE: 1.015 (ref 1.005–1.030)
Urobilinogen, UA: 0.2 mg/dL (ref 0.0–1.0)
pH: 6.5 (ref 5.0–8.0)

## 2015-01-06 NOTE — Progress Notes (Signed)
DNKAs but seen in MAU for contractions.Pos UDS for benzos and opiates. Takes Percocet prn migraines, took Valium x1 before last UDS. States cocaine pos due to having a sip of friend's drink. Explained baby will be tested and CPS consulted if positive. Not using since.  EFW 7# Reviewed plans> does not have [facility] Labor and FM precautions. NST next.

## 2015-01-09 ENCOUNTER — Inpatient Hospital Stay (HOSPITAL_COMMUNITY): Payer: Medicaid Other | Admitting: Anesthesiology

## 2015-01-09 ENCOUNTER — Encounter (HOSPITAL_COMMUNITY): Payer: Self-pay | Admitting: *Deleted

## 2015-01-09 ENCOUNTER — Inpatient Hospital Stay (HOSPITAL_COMMUNITY)
Admission: AD | Admit: 2015-01-09 | Discharge: 2015-01-11 | DRG: 775 | Disposition: A | Discharge status: Home / Self Care | Payer: Medicaid Other | Source: Ambulatory Visit | Attending: Obstetrics & Gynecology | Admitting: Obstetrics & Gynecology

## 2015-01-09 DIAGNOSIS — Z3A40 40 weeks gestation of pregnancy: Secondary | ICD-10-CM | POA: Diagnosis present

## 2015-01-09 DIAGNOSIS — O99334 Smoking (tobacco) complicating childbirth: Secondary | ICD-10-CM | POA: Diagnosis present

## 2015-01-09 DIAGNOSIS — Z3483 Encounter for supervision of other normal pregnancy, third trimester: Secondary | ICD-10-CM | POA: Diagnosis present

## 2015-01-09 DIAGNOSIS — F17210 Nicotine dependence, cigarettes, uncomplicated: Secondary | ICD-10-CM | POA: Diagnosis present

## 2015-01-09 DIAGNOSIS — O480 Post-term pregnancy: Principal | ICD-10-CM | POA: Diagnosis present

## 2015-01-09 DIAGNOSIS — IMO0001 Reserved for inherently not codable concepts without codable children: Secondary | ICD-10-CM

## 2015-01-09 DIAGNOSIS — F1410 Cocaine abuse, uncomplicated: Secondary | ICD-10-CM

## 2015-01-09 DIAGNOSIS — F1910 Other psychoactive substance abuse, uncomplicated: Secondary | ICD-10-CM

## 2015-01-09 DIAGNOSIS — O99324 Drug use complicating childbirth: Secondary | ICD-10-CM

## 2015-01-09 LAB — RAPID URINE DRUG SCREEN, HOSP PERFORMED
Amphetamines: NOT DETECTED
BARBITURATES: NOT DETECTED
Benzodiazepines: POSITIVE — AB
COCAINE: POSITIVE — AB
Opiates: NOT DETECTED
TETRAHYDROCANNABINOL: NOT DETECTED

## 2015-01-09 LAB — CBC
HCT: 27 % — ABNORMAL LOW (ref 36.0–46.0)
HEMOGLOBIN: 9 g/dL — AB (ref 12.0–15.0)
MCH: 29 pg (ref 26.0–34.0)
MCHC: 33.3 g/dL (ref 30.0–36.0)
MCV: 87.1 fL (ref 78.0–100.0)
Platelets: 339 10*3/uL (ref 150–400)
RBC: 3.1 MIL/uL — ABNORMAL LOW (ref 3.87–5.11)
RDW: 13.7 % (ref 11.5–15.5)
WBC: 9.7 10*3/uL (ref 4.0–10.5)

## 2015-01-09 MED ORDER — OXYCODONE-ACETAMINOPHEN 5-325 MG PO TABS: 2.0000 | ORAL_TABLET | ORAL | Status: DC | PRN

## 2015-01-09 MED ORDER — FERROUS SULFATE 325 (65 FE) MG PO TABS
325.0000 mg | ORAL_TABLET | Freq: Two times a day (BID) | ORAL | Status: DC
  Administered 2015-01-09 – 2015-01-11 (×4): 325 mg via ORAL
  Filled 2015-01-09 (×4): qty 1

## 2015-01-09 MED ORDER — OXYTOCIN BOLUS FROM INFUSION
500.0000 mL | INTRAVENOUS | Status: DC
  Administered 2015-01-09: 500 mL via INTRAVENOUS

## 2015-01-09 MED ORDER — LACTATED RINGERS IV SOLN: 500.0000 mL | INTRAVENOUS | Status: DC | PRN

## 2015-01-09 MED ORDER — ONDANSETRON HCL 4 MG/2ML IJ SOLN: 4.0000 mg | Freq: Four times a day (QID) | INTRAMUSCULAR | Status: DC | PRN

## 2015-01-09 MED ORDER — SENNOSIDES-DOCUSATE SODIUM 8.6-50 MG PO TABS
2.0000 | ORAL_TABLET | ORAL | Status: DC
  Administered 2015-01-09 – 2015-01-10 (×2): 2 via ORAL
  Filled 2015-01-09 (×2): qty 2

## 2015-01-09 MED ORDER — OXYTOCIN 40 UNITS IN LACTATED RINGERS INFUSION - SIMPLE MED
62.5000 mL/h | INTRAVENOUS | Status: DC
  Administered 2015-01-09: 62.5 mL/h via INTRAVENOUS
  Filled 2015-01-09: qty 1000

## 2015-01-09 MED ORDER — LACTATED RINGERS IV SOLN
INTRAVENOUS | Status: DC
  Administered 2015-01-09: 09:00:00 via INTRAVENOUS

## 2015-01-09 MED ORDER — FENTANYL 2.5 MCG/ML BUPIVACAINE 1/10 % EPIDURAL INFUSION (WH - ANES)
14.0000 mL/h | INTRAMUSCULAR | Status: DC | PRN
  Administered 2015-01-09: 14 mL/h via EPIDURAL
  Filled 2015-01-09: qty 125

## 2015-01-09 MED ORDER — PRENATAL MULTIVITAMIN CH
1.0000 | ORAL_TABLET | Freq: Every day | ORAL | Status: DC
  Administered 2015-01-10 – 2015-01-11 (×2): 1 via ORAL
  Filled 2015-01-09 (×2): qty 1

## 2015-01-09 MED ORDER — OXYCODONE-ACETAMINOPHEN 5-325 MG PO TABS
1.0000 | ORAL_TABLET | ORAL | Status: DC | PRN
  Administered 2015-01-10 (×2): 1 via ORAL
  Filled 2015-01-09: qty 1

## 2015-01-09 MED ORDER — MISOPROSTOL 200 MCG PO TABS
ORAL_TABLET | ORAL | Status: AC
  Administered 2015-01-09: 1000 ug via RECTAL
  Filled 2015-01-09: qty 5

## 2015-01-09 MED ORDER — DIBUCAINE 1 % RE OINT: 1.0000 "application " | TOPICAL_OINTMENT | RECTAL | Status: DC | PRN

## 2015-01-09 MED ORDER — FLEET ENEMA 7-19 GM/118ML RE ENEM: 1.0000 | ENEMA | RECTAL | Status: DC | PRN

## 2015-01-09 MED ORDER — OXYCODONE-ACETAMINOPHEN 5-325 MG PO TABS
2.0000 | ORAL_TABLET | ORAL | Status: DC | PRN
  Administered 2015-01-09 – 2015-01-11 (×9): 2 via ORAL
  Filled 2015-01-09 (×10): qty 2

## 2015-01-09 MED ORDER — DIPHENHYDRAMINE HCL 25 MG PO CAPS: 25.0000 mg | ORAL_CAPSULE | Freq: Four times a day (QID) | ORAL | Status: DC | PRN

## 2015-01-09 MED ORDER — LIDOCAINE HCL (PF) 1 % IJ SOLN
INTRAMUSCULAR | Status: DC | PRN
  Administered 2015-01-09 (×2): 4 mL

## 2015-01-09 MED ORDER — ONDANSETRON HCL 4 MG PO TABS: 4.0000 mg | ORAL_TABLET | ORAL | Status: DC | PRN

## 2015-01-09 MED ORDER — CITRIC ACID-SODIUM CITRATE 334-500 MG/5ML PO SOLN: 30.0000 mL | ORAL | Status: DC | PRN

## 2015-01-09 MED ORDER — LIDOCAINE HCL (PF) 1 % IJ SOLN
30.0000 mL | INTRAMUSCULAR | Status: DC | PRN
  Filled 2015-01-09: qty 30

## 2015-01-09 MED ORDER — OXYCODONE-ACETAMINOPHEN 5-325 MG PO TABS: 1.0000 | ORAL_TABLET | ORAL | Status: DC | PRN

## 2015-01-09 MED ORDER — ACETAMINOPHEN 325 MG PO TABS
650.0000 mg | ORAL_TABLET | ORAL | Status: DC | PRN
  Administered 2015-01-09: 650 mg via ORAL
  Filled 2015-01-09: qty 2

## 2015-01-09 MED ORDER — EPHEDRINE 5 MG/ML INJ
10.0000 mg | INTRAVENOUS | Status: DC | PRN
  Filled 2015-01-09: qty 2

## 2015-01-09 MED ORDER — ACETAMINOPHEN 325 MG PO TABS: 650.0000 mg | ORAL_TABLET | ORAL | Status: DC | PRN

## 2015-01-09 MED ORDER — PHENYLEPHRINE 40 MCG/ML (10ML) SYRINGE FOR IV PUSH (FOR BLOOD PRESSURE SUPPORT)
80.0000 ug | PREFILLED_SYRINGE | INTRAVENOUS | Status: DC | PRN
  Filled 2015-01-09: qty 2

## 2015-01-09 MED ORDER — LANOLIN HYDROUS EX OINT: 1.0000 "application " | TOPICAL_OINTMENT | CUTANEOUS | Status: DC | PRN

## 2015-01-09 MED ORDER — BENZOCAINE-MENTHOL 20-0.5 % EX AERO
1.0000 "application " | INHALATION_SPRAY | CUTANEOUS | Status: DC | PRN
  Administered 2015-01-09: 1 via TOPICAL
  Filled 2015-01-09: qty 56

## 2015-01-09 MED ORDER — MISOPROSTOL 200 MCG PO TABS
1000.0000 ug | ORAL_TABLET | Freq: Once | ORAL | Status: AC
  Administered 2015-01-09: 1000 ug via RECTAL

## 2015-01-09 MED ORDER — PHENYLEPHRINE 40 MCG/ML (10ML) SYRINGE FOR IV PUSH (FOR BLOOD PRESSURE SUPPORT)
80.0000 ug | PREFILLED_SYRINGE | INTRAVENOUS | Status: DC | PRN
  Filled 2015-01-09: qty 20
  Filled 2015-01-09: qty 2

## 2015-01-09 MED ORDER — SIMETHICONE 80 MG PO CHEW
80.0000 mg | CHEWABLE_TABLET | ORAL | Status: DC | PRN
Start: 2015-01-09 — End: 2015-01-11

## 2015-01-09 MED ORDER — TETANUS-DIPHTH-ACELL PERTUSSIS 5-2.5-18.5 LF-MCG/0.5 IM SUSP: 0.5000 mL | Freq: Once | INTRAMUSCULAR | Status: DC

## 2015-01-09 MED ORDER — ZOLPIDEM TARTRATE 5 MG PO TABS: 5.0000 mg | ORAL_TABLET | Freq: Every evening | ORAL | Status: DC | PRN

## 2015-01-09 MED ORDER — MEASLES, MUMPS & RUBELLA VAC ~~LOC~~ INJ
0.5000 mL | INJECTION | Freq: Once | SUBCUTANEOUS | Status: DC
  Filled 2015-01-09: qty 0.5

## 2015-01-09 MED ORDER — ONDANSETRON HCL 4 MG/2ML IJ SOLN: 4.0000 mg | INTRAMUSCULAR | Status: DC | PRN

## 2015-01-09 MED ORDER — LACTATED RINGERS IV SOLN
500.0000 mL | Freq: Once | INTRAVENOUS | Status: AC
  Administered 2015-01-09: 1000 mL via INTRAVENOUS

## 2015-01-09 MED ORDER — MAGNESIUM HYDROXIDE 400 MG/5ML PO SUSP: 30.0000 mL | ORAL | Status: DC | PRN

## 2015-01-09 MED ORDER — DIPHENHYDRAMINE HCL 50 MG/ML IJ SOLN: 12.5000 mg | INTRAMUSCULAR | Status: DC | PRN

## 2015-01-09 MED ORDER — WITCH HAZEL-GLYCERIN EX PADS: 1.0000 "application " | MEDICATED_PAD | CUTANEOUS | Status: DC | PRN

## 2015-01-09 NOTE — Anesthesia Procedure Notes (Signed)
Epidural Patient location during procedure: OB Start time: 01/09/2015 10:00 AM  Staffing Anesthesiologist: Karie SchwalbeJUDD, Deaunte Dente Performed by: anesthesiologist   Preanesthetic Checklist Completed: patient identified, site marked, surgical consent, pre-op evaluation, timeout performed, IV checked, risks and benefits discussed and monitors and equipment checked  Epidural Patient position: sitting Prep: site prepped and draped and DuraPrep Patient monitoring: continuous pulse ox and blood pressure Approach: midline Location: L3-L4 Injection technique: LOR saline  Needle:  Needle type: Tuohy  Needle gauge: 17 G Needle length: 9 cm and 9 Needle insertion depth: 5 cm cm Catheter type: closed end flexible Catheter size: 19 Gauge Catheter at skin depth: 10 cm Test dose: negative  Assessment Events: blood not aspirated, injection not painful, no injection resistance, negative IV test and no paresthesia  Additional Notes Patient identified. Risks/Benefits/Options discussed with patient including but not limited to bleeding, infection, nerve damage, paralysis, failed block, incomplete pain control, headache, blood pressure changes, nausea, vomiting, reactions to medication both or allergic, itching and postpartum back pain. Confirmed with bedside nurse the patient's most recent platelet count. Confirmed with patient that they are not currently taking any anticoagulation, have any bleeding history or any family history of bleeding disorders. Patient expressed understanding and wished to proceed. All questions were answered. Sterile technique was used throughout the entire procedure. Please see nursing notes for vital signs. Test dose was given through epidural catheter and negative prior to continuing to dose epidural or start infusion. Warning signs of high block given to the patient including shortness of breath, tingling/numbness in hands, complete motor block, or any concerning symptoms with  instructions to call for help. Patient was given instructions on fall risk and not to get out of bed. All questions and concerns addressed with instructions to call with any issues or inadequate analgesia.

## 2015-01-09 NOTE — MAU Note (Signed)
C/o ucs since 0700 this Am;

## 2015-01-09 NOTE — H&P (Signed)
HPI: Kristi CromeBrittany S Marmo is a 29 y.o. year old [phone] female at 2375w1d weeks gestation who presents to MAU reporting Labor. Denies LOF, VB. Hx Seizure activity--Dx pseodoseizures. Hx Pre-E.  [facility]  Dating redating 6856w6d sono Blood type: AB/POS/-- (12/28 1605)  Genetic Screen  Quad:     neg       Antibody:NEG (12/28 1605)  Anatomic US  Rubella: 1.27 (12/28 1605)  GTT Early:               Third trimester: 127 RPR: NON REAC (04/05 1501)   TDaP vaccine  HBsAg: NEGATIVE (12/28 1605)   Flu vaccine  HIV: NONREACTIVE (04/05 1501)   GBS Neg AVW:UJWJXBJYGBS:Negative (04/20 0000)  Contraception  BTL or Nexplanon> consented for interval tubal Pap:  Baby Food breast   Circumcision n/a   Pediatrician unsure   Support Person FOB, mo      History OB History    Gravida Para Term Preterm AB TAB SAB Ectopic Multiple Living   8 4 4  0 3 3 0 0 0 4     Past Medical History  Diagnosis Date  . Anxiety   . PIH (pregnancy induced hypertension)   . Bronchitis   . Seasonal allergies   . Pregnancy induced hypertension   . Seizure     pseudoseizures; stress related  . Pyelonephritis   . Headache     migraines   Past Surgical History  Procedure Laterality Date  . Abortions    . Dilation and curettage of uterus      ? retained POC    Family History: family history includes Diabetes in her maternal aunt and maternal grandmother; Heart disease in her maternal grandfather and maternal grandmother; Hypertension in her maternal grandfather, maternal grandmother, and mother. There is no history of Anesthesia problems, Hypotension, Malignant hyperthermia, Pseudochol deficiency, Other, or Hearing loss. Social History:  reports that she has been smoking Cigarettes.  She has a 1.75 pack-year smoking history. She has never used smokeless tobacco. She reports that she does not drink alcohol or use illicit drugs.   Prenatal Transfer Tool  Maternal Diabetes: No Genetic Screening: Normal Maternal  Ultrasounds/Referrals: Normal Fetal Ultrasounds or other Referrals:  None Maternal Substance Abuse:  No Significant Maternal Medications:  Meds include: Other:  Significant Maternal Lab Results:  Lab values include: Group B Strep negative Other Comments:  Rx percocet and Benzo's  ROS  Dilation: 5 Effacement (%): 70 Station: -2 Exam by:: Morrison Oldee Carter RN Last menstrual period 03/25/2014.   Fetal Exam Fetal Monitor Review: Mode: ultrasound.   Baseline rate: 145.  Variability: moderate (6-25 bpm).   Pattern: no decelerations and accelerations present.    Fetal State Assessment: Category I - tracings are normal.     Physical Exam  Prenatal labs: ABO, Rh: AB/POS/-- (12/28 1605) Antibody: NEG (12/28 1605) Rubella: 1.27 (12/28 1605) RPR: NON REAC (04/05 1501)  HBsAg: NEGATIVE (12/28 1605)  HIV: NONREACTIVE (04/05 1501)  GBS: Negative (04/20 0000)   Assessment: 1. Labor: Active 2. Fetal Wellbeing: Category I  3. Pain Control: Requesting Epidural 4. GBS: Neg 5. 40.1 week IUP  Plan:  1. Admit to BS per consult with MD 2. Routine L&D orders 3. Analgesia/anesthesia PRN  4. UDS  Trevia Nop 01/09/2015, 9:08 AM

## 2015-01-09 NOTE — Anesthesia Preprocedure Evaluation (Signed)
Anesthesia Evaluation  Patient identified by MRN, date of birth, ID band Patient awake    Reviewed: Allergy & Precautions, NPO status , Patient's Chart, lab work & pertinent test results  History of Anesthesia Complications Negative for: history of anesthetic complications  Airway Mallampati: II  TM Distance: >3 FB Neck ROM: Full    Dental no notable dental hx. (+) Dental Advisory Given   Pulmonary Current Smoker,  breath sounds clear to auscultation  Pulmonary exam normal       Cardiovascular hypertension, Pt. on medications Rhythm:Regular Rate:Normal     Neuro/Psych  Headaches, Seizures - (remote sz hx, none in >3 years),  PSYCHIATRIC DISORDERS Anxiety    GI/Hepatic negative GI ROS, Neg liver ROS, (+)     substance abuse (Reports no history of substance abuse, positive history of cocaine on UDS and benzos and opiates)  ,   Endo/Other  negative endocrine ROS  Renal/GU negative Renal ROS  negative genitourinary   Musculoskeletal negative musculoskeletal ROS (+)   Abdominal   Peds negative pediatric ROS (+)  Hematology negative hematology ROS (+)   Anesthesia Other Findings   Reproductive/Obstetrics negative OB ROS                             Anesthesia Physical Anesthesia Plan  ASA: III  Anesthesia Plan: Epidural   Post-op Pain Management:    Induction:   Airway Management Planned:   Additional Equipment:   Intra-op Plan:   Post-operative Plan:   Informed Consent: I have reviewed the patients History and Physical, chart, labs and discussed the procedure including the risks, benefits and alternatives for the proposed anesthesia with the patient or authorized representative who has indicated his/her understanding and acceptance.   Dental advisory given  Plan Discussed with:   Anesthesia Plan Comments:         Anesthesia Quick Evaluation

## 2015-01-09 NOTE — Progress Notes (Signed)
UR chart review completed.  

## 2015-01-10 LAB — CBC
HCT: 21.6 % — ABNORMAL LOW (ref 36.0–46.0)
Hemoglobin: 7.2 g/dL — ABNORMAL LOW (ref 12.0–15.0)
MCH: 29 pg (ref 26.0–34.0)
MCHC: 33.3 g/dL (ref 30.0–36.0)
MCV: 87.1 fL (ref 78.0–100.0)
PLATELETS: 293 10*3/uL (ref 150–400)
RBC: 2.48 MIL/uL — ABNORMAL LOW (ref 3.87–5.11)
RDW: 13.8 % (ref 11.5–15.5)
WBC: 13.1 10*3/uL — AB (ref 4.0–10.5)

## 2015-01-10 LAB — RPR: RPR Ser Ql: NONREACTIVE

## 2015-01-10 NOTE — Anesthesia Postprocedure Evaluation (Signed)
  Anesthesia Post-op Note  Patient: Kristi CromeBrittany S Hoffart  Procedure(s) Performed: * No procedures listed *  Patient Location: Mother/Baby  Anesthesia Type:Epidural  Level of Consciousness: awake, alert , oriented and patient cooperative  Airway and Oxygen Therapy: Patient Spontanous Breathing  Post-op Pain: none  Post-op Assessment: Post-op Vital signs reviewed, Patient's Cardiovascular Status Stable, Respiratory Function Stable, Patent Airway, No headache, No backache, No residual numbness and No residual motor weakness  Post-op Vital Signs: Reviewed and stable  Last Vitals:  Filed Vitals:   01/10/15 0608  BP: 103/53  Pulse: 81  Temp: 36.7 C  Resp: 18    Complications: No apparent anesthesia complications

## 2015-01-10 NOTE — Progress Notes (Signed)
CLINICAL SOCIAL WORK MATERNAL/CHILD NOTE  Patient Details  Name: Kristi Marshall MRN: [id number] Date of Birth: 01/09/2014  Date:  01/10/2015  Clinical Social Worker Initiating Note:  Bekki Tavenner, LCSW Date/ Time Initiated:  01/10/15/1100     Child's Name:  Kristi Marshall   Legal Guardian:  Kristi Marshall (07/18/1987) and [name]    Need for Interpreter:  None   Date of Referral:  01/09/15     Reason for Referral:  Current Substance Use/Substance Use During Pregnancy   Referral Source:  Central Nursery   Address:  [address] [place], [place] [address]  Phone number:  [phone]   Household Members:  MOB stated that she and FOB live at different residences.  She stated that she has her own lease at the address listed above, but spends significant time at the FOB's home.  She shared that her other children (ages 11, 10, 7, and 2) live with her, but spend a lot of time with other family members (she reports that she maintains custody of all of her children)   Natural Supports (not living in the home):  Spouse/significant other, Immediate Family, Church   Professional Supports: None   Employment: Unemployed   Type of Work: CNA/PCA   Education:  She stated that the FOB is currently in school, studying theology, and will continuing his education in the fall to obtain a Masters degree.   Financial Resources:  Self-Pay    Other Resources:  Food Stamps , WIC   Cultural/Religious Considerations Which May Impact Care:  None reported.   Strengths:  Ability to meet basic needs , Pediatrician chosen , Home prepared for child    Risk Factors/Current Problems:   1) Substance Use: MOB presents with a +UDS for cocaine, opiates, and benzodiazepines on 4/5 and with a +UDS for benzodiazepines and cocaine on 4/28. Infant UDS is negative (did not collect first urine), MDS is pending.    Cognitive State:  MOB presents as alert and orientated. She displayed linear and  goal orientated thinking.  She presents with minimal insight and poor judgment related to her substance use as she denied using cocaine despite her positive drug screens.   Mood/Affect:  MOB presented in an euthymic mood.  She displayed a full range in affect and was in pleasant mood.  MOB became appropriately tearful and overwhelmed as CSW discussed need for CPS involvement due to +UDS for cocaine.    CSW Assessment:  MOB presented as easily engaged and receptive to the visit.  CSW began to establish rapport by assisting the MOB to identify and reflect upon the infant's birth since there was prolonged separation after her birth given her need to spend time in CN and then her transfer to the NICU.  MOB openly discussed how she felt, including being afraid, as she was concerned about the infant's health.  She discussed feeling a sense of relief once she learned that the infant would be transferred back to her room.  She smiled as she discussed how she continues to enjoy looking at the infant at her bedside.    MOB stated that her two oldest daughters (ages 10 and 11) are excited to have another sister, and shared that they have come to visit.  She stated that due to the positive support that she has from her family, she feels prepared to care for her children.  MOB stated that her two oldest daughters spend significant time with their grandmother which reduces her   obligations to cope with numerous schedules.  CSW noted that the MOB did not excessively discuss her 7 year old daughter, and MOB stated that she also spends a lot of time outside of her home with her grandmother.  MOB stated that the FOB and his family are actively involved and supportive. She stated that she and the FOB have separate leases, but discussed that she spends significant time at the FOB's home. She stated that she intends to spend the majority of the time at the FOB's home in the postpartum period.   MOB reported history of depression  and anxiety.  She stated that she was a young mother and experienced symptoms when she first became a mother. Per MOB, she no longer believes that depression is a presenting problem, but shared that she continues to have anxiety.  CSW continued to explore MOB's anxiety, and she endorsed belief that she primarily becomes anxious when she is concerned about her safety or the safety of her children.  She recognized heightened sense of security when her children are outside since she fears that they will be harmed.  MOB stated that she does have occasional panic attacks, but that she attempts to take deep breaths in order to self-regulate.  CSW also inquired about events that may have contributed to her positive drug screens, and MOB was noted to immediately become tearful. She stated that she was informed that she had a positive drug screen for cocaine, opiates, and benzodiazepines, but she is unsure how she tested positive for cocaine since she has no history of cocaine use (she reported firm belief that she has never used).  She reported that she has a prescription for hydrocodone, and admitted that she took the FOB's benzodiazepines (she reported Ativan use) because of a panic attack.   CSW reviewed MOB's drug screens, and drugs screens highlight that MOB was positive for Valium (not Ativan) and percocet and opana (not hydrocodone).  CSW did not confront MOB on these findings since CSW wanted to avoid engaging in a power struggle.  CSW informed MOB that her UDS was also positive for cocaine and benzodiazepines.  MOB continued to report disbelief about testing positive for cocaine.     MOB expressed fear that "she'll be taken away from me" and reported that she would do "whatever it takes" in order to be allowed to take the infant home.  CSW shared that CPS will be involved due to the drug use, and inquired about how CSW can support her at this time.  MOB requested that CPS be informed that she is willing to  comply with all recommendations.  She stated that her family and FOB do not know of her +UDS, and she voiced preference to maintain this privacy.  CSW agreed to inform CPS of this information as well.     CSW did inquire about prior CPS history.  MOB reported prior history when her two oldest children were young due to etoh, SI, and subsequent admission to a behavioral health hospital. She maintained a firm belief that her children were returned to her after she attended therapy.  MOB stated that she continues to have parental rights for all of her children.   CSW Plan/Description:   1)Child Protective Service Report: Report made to [place] CPS.  Jerin Elliott ([phone]) will meet with MOB before the end of today (4/29).   2)Patient/Family Education:  Hospital drug screen policy 3)Psychosocial Support and Ongoing Assessment of Needs:  CSW will follow   up with CPS in order to determine status of the report and their discharge recommendations.  CSW will continue to provide support to the MOB as she copes with CPS involvement.   4) CSW will collaborate with CPS to assist with referrals as needed.   Elliannah Wayment, LCSW [phone] 01/10/2015, 1:00 PM  

## 2015-01-10 NOTE — Progress Notes (Signed)
CSW spoke with CPS worker, Jerin Elliott, who reported intention to meet with MOB this afternoon.  As of 5:30pm, CPS had not yet arrived at the hospital.   Weekend CSW to follow up with CPS on 4/30 in order to determine status of their investigation and receive disposition recommendations.   

## 2015-01-10 NOTE — Progress Notes (Signed)
Post Partum Day 1 Subjective: no complaints, up ad lib, voiding and tolerating PO, small lochia, plans to bottle feed, plans interval BTL vs Nexplanon,  But hasnlt signed papers  Objective: Blood pressure 103/53, pulse 81, temperature 98.1 F (36.7 C), temperature source Oral, resp. rate 18, height 5\' 5"  (1.651 m), weight 90.719 kg (200 lb), last menstrual period 03/25/2014, SpO2 100 %, unknown if currently breastfeeding.  Physical Exam:  General: alert, cooperative and no distress Lochia:normal flow Chest: CTAB Heart: RRR no m/r/g Abdomen: +BS, soft, nontender,  Uterine Fundus: firm DVT Evaluation: No evidence of DVT seen on physical exam. Extremities: no edema   Recent Labs  01/09/15 0555 01/10/15 0521  HGB 9.0* 7.2*  HCT 27.0* 21.6*    Assessment/Plan: Plan for discharge tomorrow and Social Work consult  Have secretsary come up to sign BTL papers today   LOS: 1 day   Kristi Marshall,Kristi Marshall 01/10/2015, 7:46 AM

## 2015-01-11 LAB — TYPE AND SCREEN
ABO/RH(D): AB POS
ANTIBODY SCREEN: NEGATIVE
PT AG Type: NEGATIVE
Unit division: 0

## 2015-01-11 NOTE — Progress Notes (Signed)
RN reviewed discharge instructions  with Mom. Patient verbalized understanding that infant would be cared for in central nursery per MD care instruction order.  Mom again urged to keep bracelets intact for infant band match. RN gave her the CN phone number and reviewed visiting hours. Mom appropriate with baby and kissed her goodbye. RN escorted infant to CN. Report given to central nursery RN. Boykin PeekNancy Nalee Lightle, RN

## 2015-01-11 NOTE — Discharge Instructions (Signed)

## 2015-01-11 NOTE — Discharge Summary (Signed)
Obstetric Discharge Summary Reason for Admission: onset of labor Prenatal Procedures: none Intrapartum Procedures: spontaneous vaginal delivery Postpartum Procedures: none Complications-Operative and Postpartum: none HEMOGLOBIN  Date Value Ref Range Status  01/10/2015 7.2* 12.0 - 15.0 g/dL Final    Comment:    DELTA CHECK NOTED REPEATED TO VERIFY    HCT  Date Value Ref Range Status  01/10/2015 21.6* 36.0 - 46.0 % Final   Ms Kristi Marshall is a 28yo [id number] admitted in the morning of 4/28 in active labor. She progressed to SVD just before noon that same day. By PPD#2 she is deemed to have received the full benefit of her hospital stay and will be discharged home. Her UDS was + for cocaine and benzos while admitted. CPS has been called to eval the situation. She is bottlefeeding and desires either a Nexplanon or an interval BTL.  Physical Exam:  General: alert, cooperative and no distress  Heart: RRR Lungs: nl effort Lochia: appropriate Uterine Fundus: firm DVT Evaluation: No evidence of DVT seen on physical exam.  Discharge Diagnoses: Term Pregnancy-delivered  Discharge Information: Date: 01/11/2015 Activity: pelvic rest Diet: routine Medications: Tylenol OTC Condition: stable Instructions: refer to practice specific booklet Discharge to: home Follow-up Information    [facility] Schedule an appointment as soon as possible for a visit in 4 weeks.   Why:  For your postpartum appointment.   Contact information:   [address] [place] [phone]      Newborn Data: Live born female  Birth Weight: 7 lb 1.2 oz (3210 g) APGAR: 7, 7  Home with : CPS to make plan for infant.  Cam HaiSHAW, Kristi Munger CNM 01/11/2015, 7:56 AM

## 2015-01-11 NOTE — Progress Notes (Signed)
CSW met with pt this morning & obtained a copy of the safety assessment, prepared by Child Protective Services, (CPS) worker, J. Elliott. Per safety assessment, the infant should not be discharged with MOB over the weekend. J. Elliott encouraged MOB to arrange alternate discharge plans for the infant by Monday, Jan 13, 2015. According to MOB, CPS worker spoke at length with FOB about the infant discharging with him. At this time, a plan is not in place. CPS will follow up on Monday. MOB plans to discharge home & come back to visit with the baby in the nursery during visiting hours. She was advised not to remove her bracelets & verbalized an understanding. MOB seemed upset & depressed about CPS involvement however cooperative. She offered to provide pt with counseling services however she declined. CSW will continue to assist & follow up discharge. A copy of the safety assessment was placed in infant's paper chart.     

## 2015-01-13 ENCOUNTER — Other Ambulatory Visit: Payer: Self-pay

## 2015-01-15 ENCOUNTER — Encounter: Payer: Self-pay | Admitting: Obstetrics and Gynecology

## 2015-02-12 ENCOUNTER — Ambulatory Visit: Payer: Self-pay | Admitting: Obstetrics and Gynecology

## 2015-02-12 ENCOUNTER — Telehealth: Payer: Self-pay

## 2015-02-12 NOTE — Telephone Encounter (Signed)
Missed PP visit today. Attempted to contact patient. No answer. Left message stating we are sorry you missed your appointment, please call clinic to reschedule.

## 2015-03-22 ENCOUNTER — Encounter (HOSPITAL_COMMUNITY): Payer: Self-pay | Admitting: *Deleted

## 2015-03-22 ENCOUNTER — Emergency Department (INDEPENDENT_AMBULATORY_CARE_PROVIDER_SITE_OTHER)
Admission: EM | Admit: 2015-03-22 | Discharge: 2015-03-22 | Disposition: A | Discharge status: Home / Self Care | Payer: Medicaid Other | Source: Home / Self Care | Attending: Family Medicine | Admitting: Family Medicine

## 2015-03-22 DIAGNOSIS — G43109 Migraine with aura, not intractable, without status migrainosus: Secondary | ICD-10-CM | POA: Diagnosis not present

## 2015-03-22 MED ORDER — ONDANSETRON 4 MG PO TBDP
ORAL_TABLET | ORAL | Status: AC
  Filled 2015-03-22: qty 1

## 2015-03-22 MED ORDER — METHYLPREDNISOLONE ACETATE 80 MG/ML IJ SUSP
80.0000 mg | Freq: Once | INTRAMUSCULAR | Status: AC
Start: 2015-03-22 — End: 2015-03-22
  Administered 2015-03-22: 80 mg via INTRAMUSCULAR

## 2015-03-22 MED ORDER — KETOROLAC TROMETHAMINE 30 MG/ML IJ SOLN
30.0000 mg | Freq: Once | INTRAMUSCULAR | Status: AC
Start: 2015-03-22 — End: 2015-03-22
  Administered 2015-03-22: 30 mg via INTRAMUSCULAR

## 2015-03-22 MED ORDER — KETOROLAC TROMETHAMINE 30 MG/ML IJ SOLN
INTRAMUSCULAR | Status: AC
  Filled 2015-03-22: qty 1

## 2015-03-22 MED ORDER — METHYLPREDNISOLONE ACETATE 80 MG/ML IJ SUSP
INTRAMUSCULAR | Status: AC
  Filled 2015-03-22: qty 1

## 2015-03-22 MED ORDER — ONDANSETRON 4 MG PO TBDP
4.0000 mg | ORAL_TABLET | Freq: Once | ORAL | Status: AC
Start: 2015-03-22 — End: 2015-03-22
  Administered 2015-03-22: 4 mg via ORAL

## 2015-03-22 MED ORDER — AMITRIPTYLINE HCL 50 MG PO TABS: 50.0000 mg | ORAL_TABLET | Freq: Every day | ORAL | Status: DC

## 2015-03-22 NOTE — ED Notes (Signed)
Pt  Reports    Symptoms  Of  Headache     And  Back  Pain         With  Symptoms      Since  Yesterday       PT   Reports  Headaches  In past   - has  Had  Some  Nausea  As   Well     -      Pt  Reports       That    The  Light  Hurts  His  Eyes

## 2015-03-22 NOTE — ED Provider Notes (Signed)
CSN: [id number]     Arrival date & time 03/22/15  1756 History   First MD Initiated Contact with Patient 03/22/15 1916     Chief Complaint  Patient presents with  . Headache   (Consider location/radiation/quality/duration/timing/severity/associated sxs/prior Treatment) Patient is a 29 y.o. female presenting with headaches. The history is provided by the patient.  Headache Pain location:  Frontal Quality:  Stabbing Radiates to:  Does not radiate Onset quality:  Gradual Duration:  2 days Progression:  Unchanged Chronicity:  Chronic Similar to prior headaches: yes   Context: bright light and emotional stress   Relieved by:  None tried Worsened by:  Nothing Ineffective treatments:  None tried Associated symptoms: back pain, nausea and photophobia   Associated symptoms: no blurred vision, no dizziness, no fever, no vomiting and no weakness     Past Medical History  Diagnosis Date  . Anxiety   . PIH (pregnancy induced hypertension)   . Bronchitis   . Seasonal allergies   . Pregnancy induced hypertension   . Seizure     pseudoseizures; stress related  . Pyelonephritis   . Headache     migraines   Past Surgical History  Procedure Laterality Date  . Abortions    . Dilation and curettage of uterus      ? retained POC    Family History  Problem Relation Age of Onset  . Hypertension Mother   . Diabetes Maternal Aunt   . Hypertension Maternal Grandmother   . Diabetes Maternal Grandmother   . Heart disease Maternal Grandmother   . Hypertension Maternal Grandfather   . Heart disease Maternal Grandfather   . Anesthesia problems Neg Hx   . Hypotension Neg Hx   . Malignant hyperthermia Neg Hx   . Pseudochol deficiency Neg Hx   . Other Neg Hx   . Hearing loss Neg Hx    History  Substance Use Topics  . Smoking status: Current Some Day Smoker -- 0.25 packs/day for 7 years    Types: Cigarettes  . Smokeless tobacco: Never Used  . Alcohol Use: No   OB History    Gravida  Para Term Preterm AB TAB SAB Ectopic Multiple Living   8 5 5  0 3 3 0 0 0 5     Review of Systems  Constitutional: Negative.  Negative for fever.  Eyes: Positive for photophobia. Negative for blurred vision.  Gastrointestinal: Positive for nausea. Negative for vomiting.  Musculoskeletal: Positive for back pain.  Skin: Negative.   Neurological: Positive for headaches. Negative for dizziness, speech difficulty, weakness and light-headedness.    Allergies  Bactrim and Ibuprofen  Home Medications   Prior to Admission medications   Medication Sig Start Date End Date Taking? Authorizing Provider  acetaminophen (TYLENOL) 500 MG tablet Take 1,000 mg by mouth every 6 (six) hours as needed for mild pain.    Historical Provider, MD  albuterol (PROVENTIL HFA;VENTOLIN HFA) 108 (90 BASE) MCG/ACT inhaler Inhale 2 puffs into the lungs every 6 (six) hours as needed. For shortness of breath 05/21/12   Antionette CharLisa Jackson-Moore, MD  amitriptyline (ELAVIL) 50 MG tablet Take 1 tablet (50 mg total) by mouth at bedtime. 03/22/15   Linna HoffJames D Deshannon Seide, MD  Prenatal Vit-Fe Fumarate-FA (PRENATAL MULTIVITAMIN) TABS tablet Take 1 tablet by mouth daily at 12 noon.    Historical Provider, MD   BP 146/96 mmHg  Pulse 71  Temp(Src) 98.4 F (36.9 C) (Oral)  Resp 16  SpO2 99%  LMP 03/21/2015 Physical Exam  Constitutional: She is oriented to person, place, and time. She appears well-developed and well-nourished. No distress.  HENT:  Head: Normocephalic.  Right Ear: External ear normal.  Left Ear: External ear normal.  Mouth/Throat: Oropharynx is clear and moist.  Eyes: Conjunctivae are normal. Pupils are equal, round, and reactive to light.  Neck: Normal range of motion. Neck supple.  Cardiovascular: Regular rhythm, normal heart sounds and intact distal pulses.   Pulmonary/Chest: Effort normal and breath sounds normal.  Lymphadenopathy:    She has no cervical adenopathy.  Neurological: She is alert and oriented to person,  place, and time. No cranial nerve deficit.  Skin: Skin is warm and dry.  Nursing note and vitals reviewed.   ED Course  Procedures (including critical care time) Labs Review Labs Reviewed - No data to display  Imaging Review No results found.   MDM   1. Migraine with aura and without status migrainosus, not intractable        Linna Hoff, MD 03/22/15 [phone]

## 2015-04-27 ENCOUNTER — Encounter (HOSPITAL_COMMUNITY): Payer: Self-pay | Admitting: *Deleted

## 2015-04-27 ENCOUNTER — Emergency Department (HOSPITAL_COMMUNITY): Payer: Medicaid Other

## 2015-04-27 ENCOUNTER — Emergency Department (HOSPITAL_COMMUNITY)
Admission: EM | Admit: 2015-04-27 | Discharge: 2015-04-27 | Disposition: A | Discharge status: Home / Self Care | Payer: Medicaid Other | Attending: Emergency Medicine | Admitting: Emergency Medicine

## 2015-04-27 DIAGNOSIS — Z8709 Personal history of other diseases of the respiratory system: Secondary | ICD-10-CM | POA: Insufficient documentation

## 2015-04-27 DIAGNOSIS — R079 Chest pain, unspecified: Secondary | ICD-10-CM | POA: Diagnosis present

## 2015-04-27 DIAGNOSIS — G43909 Migraine, unspecified, not intractable, without status migrainosus: Secondary | ICD-10-CM | POA: Diagnosis not present

## 2015-04-27 DIAGNOSIS — R5383 Other fatigue: Secondary | ICD-10-CM | POA: Insufficient documentation

## 2015-04-27 DIAGNOSIS — Z87448 Personal history of other diseases of urinary system: Secondary | ICD-10-CM | POA: Insufficient documentation

## 2015-04-27 DIAGNOSIS — Z3202 Encounter for pregnancy test, result negative: Secondary | ICD-10-CM | POA: Diagnosis not present

## 2015-04-27 DIAGNOSIS — Z720 Tobacco use: Secondary | ICD-10-CM | POA: Diagnosis not present

## 2015-04-27 DIAGNOSIS — F419 Anxiety disorder, unspecified: Secondary | ICD-10-CM | POA: Diagnosis not present

## 2015-04-27 DIAGNOSIS — Z79899 Other long term (current) drug therapy: Secondary | ICD-10-CM | POA: Insufficient documentation

## 2015-04-27 LAB — CBC
HCT: 38.1 % (ref 36.0–46.0)
Hemoglobin: 11.7 g/dL — ABNORMAL LOW (ref 12.0–15.0)
MCH: 23.3 pg — ABNORMAL LOW (ref 26.0–34.0)
MCHC: 30.7 g/dL (ref 30.0–36.0)
MCV: 75.9 fL — ABNORMAL LOW (ref 78.0–100.0)
Platelets: 401 10*3/uL — ABNORMAL HIGH (ref 150–400)
RBC: 5.02 MIL/uL (ref 3.87–5.11)
RDW: 18.8 % — AB (ref 11.5–15.5)
WBC: 6.5 10*3/uL (ref 4.0–10.5)

## 2015-04-27 LAB — BASIC METABOLIC PANEL
ANION GAP: 11 (ref 5–15)
BUN: 5 mg/dL — ABNORMAL LOW (ref 6–20)
CHLORIDE: 106 mmol/L (ref 101–111)
CO2: 23 mmol/L (ref 22–32)
Calcium: 9.2 mg/dL (ref 8.9–10.3)
Creatinine, Ser: 0.74 mg/dL (ref 0.44–1.00)
GFR calc Af Amer: >60 mL/min (ref >60)
GFR calc non Af Amer: >60 mL/min (ref >60)
GLUCOSE: 108 mg/dL — AB (ref 65–99)
Potassium: 3.3 mmol/L — ABNORMAL LOW (ref 3.5–5.1)
Sodium: 140 mmol/L (ref 135–145)

## 2015-04-27 LAB — I-STAT TROPONIN, ED: TROPONIN I, POC: 0 ng/mL (ref 0.00–0.08)

## 2015-04-27 LAB — POC URINE PREG, ED: PREG TEST UR: NEGATIVE

## 2015-04-27 MED ORDER — SODIUM CHLORIDE 0.9 % IV BOLUS (SEPSIS)
1000.0000 mL | Freq: Once | INTRAVENOUS | Status: AC
  Administered 2015-04-27: 1000 mL via INTRAVENOUS

## 2015-04-27 MED ORDER — GI COCKTAIL ~~LOC~~
30.0000 mL | Freq: Once | ORAL | Status: AC
  Administered 2015-04-27: 30 mL via ORAL
  Filled 2015-04-27: qty 30

## 2015-04-27 MED ORDER — NAPROXEN 250 MG PO TABS
500.0000 mg | ORAL_TABLET | Freq: Once | ORAL | Status: AC
  Administered 2015-04-27: 500 mg via ORAL
  Filled 2015-04-27: qty 2
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
## 2015-04-27 NOTE — ED Notes (Signed)
Nurse first unable to locate pt in triage waiting area.  No answer on cell phone- goes straight to voice mail.  Called home number and they state they have not seen her in several days.

## 2015-04-27 NOTE — ED Notes (Signed)
Unable to locate pt in waiting area 
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
## 2015-04-27 NOTE — ED Notes (Signed)
No answer from pt in waiting or xray department.
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
## 2015-04-27 NOTE — ED Notes (Signed)
Patient was sleeping in waiting area and did not hear her name called.  Will be next to go back.

## 2015-04-27 NOTE — Discharge Instructions (Signed)
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
## 2015-04-27 NOTE — ED Notes (Signed)
Provider at bedside

## 2015-08-09 ENCOUNTER — Emergency Department (HOSPITAL_COMMUNITY)
Admission: EM | Admit: 2015-08-09 | Discharge: 2015-08-09 | Disposition: A | Discharge status: Home / Self Care | Payer: Medicaid Other | Attending: Emergency Medicine | Admitting: Emergency Medicine

## 2015-08-09 ENCOUNTER — Encounter (HOSPITAL_COMMUNITY): Payer: Self-pay

## 2015-08-09 DIAGNOSIS — K002 Abnormalities of size and form of teeth: Secondary | ICD-10-CM | POA: Insufficient documentation

## 2015-08-09 DIAGNOSIS — Z79899 Other long term (current) drug therapy: Secondary | ICD-10-CM | POA: Insufficient documentation

## 2015-08-09 DIAGNOSIS — K0889 Other specified disorders of teeth and supporting structures: Secondary | ICD-10-CM | POA: Diagnosis present

## 2015-08-09 DIAGNOSIS — Z8709 Personal history of other diseases of the respiratory system: Secondary | ICD-10-CM | POA: Insufficient documentation

## 2015-08-09 DIAGNOSIS — Z87448 Personal history of other diseases of urinary system: Secondary | ICD-10-CM | POA: Diagnosis not present

## 2015-08-09 DIAGNOSIS — K029 Dental caries, unspecified: Secondary | ICD-10-CM | POA: Diagnosis not present

## 2015-08-09 DIAGNOSIS — S025XXG Fracture of tooth (traumatic), subsequent encounter for fracture with delayed healing: Secondary | ICD-10-CM

## 2015-08-09 DIAGNOSIS — X58XXXD Exposure to other specified factors, subsequent encounter: Secondary | ICD-10-CM | POA: Diagnosis not present

## 2015-08-09 DIAGNOSIS — F17210 Nicotine dependence, cigarettes, uncomplicated: Secondary | ICD-10-CM | POA: Insufficient documentation

## 2015-08-09 DIAGNOSIS — F419 Anxiety disorder, unspecified: Secondary | ICD-10-CM | POA: Diagnosis not present

## 2015-08-09 DIAGNOSIS — Z8679 Personal history of other diseases of the circulatory system: Secondary | ICD-10-CM | POA: Insufficient documentation

## 2015-08-09 MED ORDER — OXYCODONE-ACETAMINOPHEN 5-325 MG PO TABS: 2.0000 | ORAL_TABLET | ORAL | Status: DC | PRN

## 2015-08-09 MED ORDER — AMOXICILLIN 500 MG PO CAPS: 1000.0000 mg | ORAL_CAPSULE | Freq: Every day | ORAL | Status: DC

## 2015-08-09 MED ORDER — OXYCODONE-ACETAMINOPHEN 5-325 MG PO TABS
1.0000 | ORAL_TABLET | Freq: Once | ORAL | Status: AC
  Administered 2015-08-09: 1 via ORAL
  Filled 2015-08-09: qty 1

## 2015-08-09 NOTE — Discharge Instructions (Signed)
Keep appointment with your dentist on December 15. Take anabiotic as prescribed. May follow-up with PCP in the interim if symptoms do not improve. Return to the emergency department if you experience fever, facial swelling, difficulty swallowing, difficulty breathing, inability to open mouth, swelling under the tongue.

## 2015-08-09 NOTE — ED Provider Notes (Signed)
CSN: [id number]     Arrival date & time 08/09/15  1818 History  By signing my name below, I, Gonzella LexKimberly Bianca Gray, attest that this documentation has been prepared under the direction and in the presence of Valli Randol, PA-C. Electronically Signed: Gonzella LexKimberly Bianca Gray, Scribe. 08/09/2015. 7:10 PM.    Chief Complaint  Patient presents with  . Dental Pain    The history is provided by the patient. No language interpreter was used.    HPI Comments: Kristi Marshall is a 29 y.o. female who presents to the Emergency Department complaining of constant, aching dental pain on the bottom right side and top left side of her mouth which has been ongoing for a couple of years but worsened last night after she ate some Skittles. Pt states that she believes there is a hole in her back wisdom tooth and reports that half of her tooth came out last week. She reports difficulty eating and trouble swallowing and also states that she has a cold. She reports that she can open and close her mouth with little difficulty but says that it feels better when her mouth is just still. Pt has been trying to lay down and nap to help the pain and has also tried Tylenol and Ibuprofen with no relief. Pt denies fever and chills, sore throat. She has an appointment with a dentist on the 15th of December.    Past Medical History  Diagnosis Date  . Anxiety   . PIH (pregnancy induced hypertension)   . Bronchitis   . Seasonal allergies   . Pregnancy induced hypertension   . Seizure (HCC)     pseudoseizures; stress related  . Pyelonephritis   . Headache     migraines   Past Surgical History  Procedure Laterality Date  . Abortions    . Dilation and curettage of uterus      ? retained POC    Family History  Problem Relation Age of Onset  . Hypertension Mother   . Diabetes Maternal Aunt   . Hypertension Maternal Grandmother   . Diabetes Maternal Grandmother   . Heart disease Maternal Grandmother   .  Hypertension Maternal Grandfather   . Heart disease Maternal Grandfather   . Anesthesia problems Neg Hx   . Hypotension Neg Hx   . Malignant hyperthermia Neg Hx   . Pseudochol deficiency Neg Hx   . Other Neg Hx   . Hearing loss Neg Hx    Social History  Substance Use Topics  . Smoking status: Current Some Day Smoker -- 0.25 packs/day for 7 years    Types: Cigarettes  . Smokeless tobacco: Never Used  . Alcohol Use: No   OB History    Gravida Para Term Preterm AB TAB SAB Ectopic Multiple Living   8 5 5  0 3 3 0 0 0 5     Review of Systems A complete 10 system review of systems was obtained and all systems are negative except as noted in the HPI and PMH.    Allergies  Bactrim and Ibuprofen  Home Medications   Prior to Admission medications   Medication Sig Start Date End Date Taking? Authorizing Provider  albuterol (PROVENTIL HFA;VENTOLIN HFA) 108 (90 BASE) MCG/ACT inhaler Inhale 2 puffs into the lungs every 6 (six) hours as needed. For shortness of breath 05/21/12   Antionette CharLisa Jackson-Moore, MD  amitriptyline (ELAVIL) 50 MG tablet Take 1 tablet (50 mg total) by mouth at bedtime. 03/22/15   Quita SkyeJames D  Kindl, MD  diphenhydramine-acetaminophen (TYLENOL PM) 25-500 MG TABS Take 3 tablets by mouth daily as needed (for sleep).    Historical Provider, MD   BP 157/107 mmHg  Pulse 94  Temp(Src) 98.6 F (37 C) (Oral)  SpO2 100%  LMP 07/28/2015 (Approximate) Physical Exam  Constitutional: She is oriented to person, place, and time. She appears well-developed and well-nourished. No distress.  HENT:  Head: Normocephalic and atraumatic.  Mouth/Throat: Uvula is midline, oropharynx is clear and moist and mucous membranes are normal. Abnormal dentition. Dental caries present. No dental abscesses or uvula swelling. No oropharyngeal exudate, posterior oropharyngeal edema, posterior oropharyngeal erythema or tonsillar abscesses.    Eyes: Conjunctivae are normal. Right eye exhibits no discharge. Left eye  exhibits no discharge. No scleral icterus.  Neck: Neck supple.  Cardiovascular: Normal rate.   Pulmonary/Chest: Effort normal.  Lymphadenopathy:    She has no cervical adenopathy.  Neurological: She is alert and oriented to person, place, and time. Coordination normal.  Skin: Skin is warm and dry. No rash noted. She is not diaphoretic. No erythema. No pallor.  Psychiatric: She has a normal mood and affect. Her behavior is normal.  Nursing note and vitals reviewed.   ED Course  Procedures  DIAGNOSTIC STUDIES:    Oxygen Saturation is 100% on RA, normal by my interpretation.   COORDINATION OF CARE:  6:59 PM Will prescribe pt antibiotics. Will administer pt a dose of pain medication in the ER and will send pt home with some pain medication as well. Discussed treatment plan with pt at bedside and pt agreed to plan.     Labs Review Labs Reviewed - No data to display  Imaging Review No results found. I have personally reviewed and evaluated these images and lab results as part of my medical decision-making.   EKG Interpretation None      MDM   Final diagnoses:  Pain, dental  Tooth fracture, with delayed healing, subsequent encounter    Patient with toothache and tooth fractures. Fractures have been present for 2 years, now with pain exacerbation. No gross abscess.  Exam unconcerning for Ludwig's angina or spread of infection.  Will treat with amoxicillin and pain medicine. Pt has dentist appointment scheduled for Dec 15. Discussed treatment plan with pt who is agreeable. Return precautions outlined in patient discharge instructions.    I personally performed the services described in this documentation, which was scribed in my presence. The recorded information has been reviewed and is accurate.     Lester Kinsman [place], PA-C 08/10/15 2155  Rolan Bucco, MD 08/10/15 2245

## 2015-08-09 NOTE — ED Notes (Addendum)
AVS explained in detail. Knows to keep dentist appointment on Dec 15. Also knows to continue taking HTN medications as prescribed and to re-check BP and follow up with PCP if needed. No other c/c.

## 2015-08-09 NOTE — ED Notes (Signed)
Pt presents with c/o dental pain that started last night. Pt has a hx of dental pain.

## 2015-09-30 ENCOUNTER — Emergency Department (HOSPITAL_COMMUNITY)
Admission: EM | Admit: 2015-09-30 | Discharge: 2015-09-30 | Disposition: A | Discharge status: Home / Self Care | Payer: Medicaid Other | Attending: Emergency Medicine | Admitting: Emergency Medicine

## 2015-09-30 ENCOUNTER — Encounter (HOSPITAL_COMMUNITY): Payer: Self-pay | Admitting: Emergency Medicine

## 2015-09-30 DIAGNOSIS — L03011 Cellulitis of right finger: Secondary | ICD-10-CM | POA: Diagnosis not present

## 2015-09-30 DIAGNOSIS — Z79899 Other long term (current) drug therapy: Secondary | ICD-10-CM | POA: Insufficient documentation

## 2015-09-30 DIAGNOSIS — F17210 Nicotine dependence, cigarettes, uncomplicated: Secondary | ICD-10-CM | POA: Diagnosis not present

## 2015-09-30 DIAGNOSIS — Z87898 Personal history of other specified conditions: Secondary | ICD-10-CM | POA: Diagnosis not present

## 2015-09-30 DIAGNOSIS — Z87448 Personal history of other diseases of urinary system: Secondary | ICD-10-CM | POA: Insufficient documentation

## 2015-09-30 DIAGNOSIS — Z792 Long term (current) use of antibiotics: Secondary | ICD-10-CM | POA: Diagnosis not present

## 2015-09-30 DIAGNOSIS — M79641 Pain in right hand: Secondary | ICD-10-CM | POA: Diagnosis present

## 2015-09-30 DIAGNOSIS — F419 Anxiety disorder, unspecified: Secondary | ICD-10-CM | POA: Insufficient documentation

## 2015-09-30 DIAGNOSIS — Z8709 Personal history of other diseases of the respiratory system: Secondary | ICD-10-CM | POA: Insufficient documentation

## 2015-09-30 MED ORDER — CEPHALEXIN 250 MG PO CAPS: 250.0000 mg | ORAL_CAPSULE | Freq: Four times a day (QID) | ORAL | Status: DC

## 2015-09-30 MED ORDER — ACETAMINOPHEN 500 MG PO TABS
1000.0000 mg | ORAL_TABLET | Freq: Once | ORAL | Status: AC
  Administered 2015-09-30: 1000 mg via ORAL
  Filled 2015-09-30: qty 2

## 2015-09-30 MED ORDER — LIDOCAINE HCL (PF) 1 % IJ SOLN
5.0000 mL | Freq: Once | INTRAMUSCULAR | Status: DC
  Filled 2015-09-30: qty 5

## 2015-09-30 NOTE — ED Provider Notes (Signed)
CSN: [id number]     Arrival date & time 09/30/15  1118 History   First MD Initiated Contact with Patient 09/30/15 1649     Chief Complaint  Patient presents with  . Hand Pain     (Consider location/radiation/quality/duration/timing/severity/associated sxs/prior Treatment) HPI Comments: Patient presents to the ED with a chief complaint of right ring finger pain.  She states that she noticed swelling and pain to the top of her right ring finger on Saturday.  States that it has progressively worsened until now.  She states that she bites her finger nails.  She states that the pain radiates to her hand and arm.  Most of the pain is on the dorsal aspect. It is worsened with palpation and movement.  She has not tried taking anything to alleviate her symptoms.  The history is provided by the patient. No language interpreter was used.    Past Medical History  Diagnosis Date  . Anxiety   . PIH (pregnancy induced hypertension)   . Bronchitis   . Seasonal allergies   . Pregnancy induced hypertension   . Seizure (HCC)     pseudoseizures; stress related  . Pyelonephritis   . Headache     migraines   Past Surgical History  Procedure Laterality Date  . Abortions    . Dilation and curettage of uterus      ? retained POC    Family History  Problem Relation Age of Onset  . Hypertension Mother   . Diabetes Maternal Aunt   . Hypertension Maternal Grandmother   . Diabetes Maternal Grandmother   . Heart disease Maternal Grandmother   . Hypertension Maternal Grandfather   . Heart disease Maternal Grandfather   . Anesthesia problems Neg Hx   . Hypotension Neg Hx   . Malignant hyperthermia Neg Hx   . Pseudochol deficiency Neg Hx   . Other Neg Hx   . Hearing loss Neg Hx    Social History  Substance Use Topics  . Smoking status: Current Some Day Smoker -- 0.25 packs/day for 7 years    Types: Cigarettes  . Smokeless tobacco: Never Used  . Alcohol Use: No   OB History    Gravida Para  Term Preterm AB TAB SAB Ectopic Multiple Living   0 3 3 0 0 0 5     Review of Systems  Constitutional: Negative for fever and chills.  Respiratory: Negative for shortness of breath.   Cardiovascular: Negative for chest pain.  Gastrointestinal: Negative for nausea, vomiting, diarrhea and constipation.  Genitourinary: Negative for dysuria.  Musculoskeletal: Positive for arthralgias.  Skin: Positive for color change.      Allergies  Bactrim and Ibuprofen  Home Medications   Prior to Admission medications   Medication Sig Start Date End Date Taking? Authorizing Provider  albuterol (PROVENTIL HFA;VENTOLIN HFA) 108 (90 BASE) MCG/ACT inhaler Inhale 2 puffs into the lungs every 6 (six) hours as needed. For shortness of breath 05/21/12   Antionette Char, MD  amitriptyline (ELAVIL) 50 MG tablet Take 1 tablet (50 mg total) by mouth at bedtime. 03/22/15   Linna Hoff, MD  amoxicillin (AMOXIL) 500 MG capsule Take 2 capsules (1,000 mg total) by mouth daily. 08/09/15   Samantha Tripp Dowless, PA-C  diphenhydramine-acetaminophen (TYLENOL PM) 25-500 MG TABS Take 3 tablets by mouth daily as needed (for sleep).    Historical Provider, MD  oxyCODONE-acetaminophen (PERCOCET/ROXICET) 5-325 MG tablet Take 2 tablets by mouth every 4 (four) hours as  needed for severe pain. 08/09/15   Samantha Tripp Dowless, PA-C   BP 159/109 mmHg  Pulse 89  Temp(Src) 98.2 F (36.8 C) (Oral)  Resp 16  SpO2 99% Physical Exam  Constitutional: She is oriented to person, place, and time. She appears well-developed and well-nourished.  HENT:  Head: Normocephalic and atraumatic.  Eyes: Conjunctivae and EOM are normal.  Neck: Normal range of motion.  Cardiovascular: Normal rate.   Pulmonary/Chest: Effort normal.  Abdominal: She exhibits no distension.  Musculoskeletal: Normal range of motion.  ROM and strength of right ring finger 5/5, doubt tenosynovitis  Neurological: She is alert and oriented to person, place,  and time.  Skin: Skin is dry.  Right ring finger paronychia, nearly all the swelling is on the posterior/dorsal aspect of the finger, doubt infection of the pulp or felon  Psychiatric: She has a normal mood and affect. Her behavior is normal. Judgment and thought content normal.  Nursing note and vitals reviewed.   ED Course  Procedures (including critical care time)  INCISION AND DRAINAGE Performed by: Roxy Horseman Consent: Verbal consent obtained. Risks and benefits: risks, benefits and alternatives were discussed Type: abscess  Body area: right ring finger  Anesthesia: Digital block  Incision was made with a scalpel.  Local anesthetic: lidocaine 2% without epinephrine  Anesthetic total: 4 ml  Complexity: complex Blunt dissection to break up loculations  Drainage: purulent  Drainage amount: moderate  Packing material: none  Patient tolerance: Patient tolerated the procedure well with no immediate complications.    MDM   Final diagnoses:  Paronychia, right    Patient with physical exam findings consistent with paronychia. Incision and drainage in the ED. Recommend hand follow-up. Will start patient on antibiotics.      Roxy Horseman, PA-C 09/30/15 1826  Arby Barrette, MD 09/30/15 Ebony Cargo

## 2015-09-30 NOTE — Discharge Instructions (Signed)
Paronychia °Paronychia is an infection of the skin that surrounds a nail. It usually affects the skin around a fingernail, but it may also occur near a toenail. It often causes pain and swelling around the nail. This condition may come on suddenly or develop over a longer period. In some cases, a collection of pus (abscess) can form near or under the nail. Usually, paronychia is not serious and it clears up with treatment. °CAUSES °This condition may be caused by bacteria or fungi. It is commonly caused by either Streptococcus or Staphylococcus bacteria. The bacteria or fungi often cause the infection by getting into the affected area through an opening in the skin, such as a cut or a hangnail. °RISK FACTORS °This condition is more likely to develop in: °· People who get their hands wet often, such as those who work as dishwashers, bartenders, or nurses. °· People who bite their fingernails or suck their thumbs. °· People who trim their nails too short. °· People who have hangnails or injured fingertips. °· People who get manicures. °· People who have diabetes. °SYMPTOMS °Symptoms of this condition include: °· Redness and swelling of the skin near the nail. °· Tenderness around the nail when you touch the area. °· Pus-filled bumps under the cuticle. The cuticle is the skin at the base or sides of the nail. °· Fluid or pus under the nail. °· Throbbing pain in the area. °DIAGNOSIS °This condition is usually diagnosed with a physical exam. In some cases, a sample of pus may be taken from an abscess to be tested in a lab. This can help to determine what type of bacteria or fungi is causing the condition. °TREATMENT °Treatment for this condition depends on the cause and severity of the condition. If the condition is mild, it may clear up on its own in a few days. Your health care provider may recommend soaking the affected area in warm water a few times a day. When treatment is needed, the options may  include: °· Antibiotic medicine, if the condition is caused by a bacterial infection. °· Antifungal medicine, if the condition is caused by a fungal infection. °· Incision and drainage, if an abscess is present. In this procedure, the health care provider will cut open the abscess so the pus can drain out. °HOME CARE INSTRUCTIONS °· Soak the affected area in warm water if directed to do so by your health care provider. You may be told to do this for 20 minutes, 2-3 times a day. Keep the area dry in between soakings. °· Take medicines only as directed by your health care provider. °· If you were prescribed an antibiotic medicine, finish all of it even if you start to feel better. °· Keep the affected area clean. °· Do not try to drain a fluid-filled bump yourself. °· If you will be washing dishes or performing other tasks that require your hands to get wet, wear rubber gloves. You should also wear gloves if your hands might come in contact with irritating substances, such as cleaners or chemicals. °· Follow your health care provider's instructions about: °¨ Wound care. °¨ Bandage (dressing) changes and removal. °SEEK MEDICAL CARE IF: °· Your symptoms get worse or do not improve with treatment. °· You have a fever or chills. °· You have redness spreading from the affected area. °· You have continued or increased fluid, blood, or pus coming from the affected area. °· Your finger or knuckle becomes swollen or is difficult to move. °  °  This information is not intended to replace advice given to you by your health care provider. Make sure you discuss any questions you have with your health care provider. °  °Document Released: 02/23/2001 Document Revised: 01/14/2015 Document Reviewed: 08/07/2014 °Elsevier Interactive Patient Education ©2016 [employer] ° °

## 2015-09-30 NOTE — ED Notes (Addendum)
Pt reports R ring fingertip redness and swelling since Saturday. Fingertip is visibly swollen, no red streaks noted. Pt also reports generalized joint pain and swelling

## 2016-01-17 ENCOUNTER — Emergency Department (HOSPITAL_COMMUNITY)
Admission: EM | Admit: 2016-01-17 | Discharge: 2016-01-17 | Disposition: A | Discharge status: Home / Self Care | Payer: Medicaid Other | Attending: Emergency Medicine | Admitting: Emergency Medicine

## 2016-01-17 ENCOUNTER — Encounter (HOSPITAL_COMMUNITY): Payer: Self-pay

## 2016-01-17 DIAGNOSIS — G43909 Migraine, unspecified, not intractable, without status migrainosus: Secondary | ICD-10-CM | POA: Diagnosis not present

## 2016-01-17 DIAGNOSIS — H109 Unspecified conjunctivitis: Secondary | ICD-10-CM

## 2016-01-17 DIAGNOSIS — H10022 Other mucopurulent conjunctivitis, left eye: Secondary | ICD-10-CM | POA: Diagnosis not present

## 2016-01-17 DIAGNOSIS — Z79899 Other long term (current) drug therapy: Secondary | ICD-10-CM | POA: Diagnosis not present

## 2016-01-17 DIAGNOSIS — Z87448 Personal history of other diseases of urinary system: Secondary | ICD-10-CM | POA: Insufficient documentation

## 2016-01-17 DIAGNOSIS — K029 Dental caries, unspecified: Secondary | ICD-10-CM | POA: Insufficient documentation

## 2016-01-17 DIAGNOSIS — F17210 Nicotine dependence, cigarettes, uncomplicated: Secondary | ICD-10-CM | POA: Diagnosis not present

## 2016-01-17 DIAGNOSIS — Z8709 Personal history of other diseases of the respiratory system: Secondary | ICD-10-CM | POA: Insufficient documentation

## 2016-01-17 DIAGNOSIS — K047 Periapical abscess without sinus: Secondary | ICD-10-CM | POA: Diagnosis not present

## 2016-01-17 DIAGNOSIS — F419 Anxiety disorder, unspecified: Secondary | ICD-10-CM | POA: Diagnosis not present

## 2016-01-17 DIAGNOSIS — H5712 Ocular pain, left eye: Secondary | ICD-10-CM | POA: Diagnosis present

## 2016-01-17 DIAGNOSIS — Z792 Long term (current) use of antibiotics: Secondary | ICD-10-CM | POA: Insufficient documentation

## 2016-01-17 MED ORDER — PENICILLIN V POTASSIUM 500 MG PO TABS: 500.0000 mg | ORAL_TABLET | Freq: Four times a day (QID) | ORAL | Status: AC

## 2016-01-17 MED ORDER — PENICILLIN V POTASSIUM 500 MG PO TABS
500.0000 mg | ORAL_TABLET | Freq: Once | ORAL | Status: AC
  Administered 2016-01-17: 500 mg via ORAL
  Filled 2016-01-17: qty 1

## 2016-01-17 MED ORDER — ERYTHROMYCIN 5 MG/GM OP OINT: TOPICAL_OINTMENT | OPHTHALMIC | Status: DC

## 2016-01-17 MED ORDER — ACETAMINOPHEN 500 MG PO TABS
1000.0000 mg | ORAL_TABLET | Freq: Once | ORAL | Status: AC
  Administered 2016-01-17: 1000 mg via ORAL
  Filled 2016-01-17: qty 2

## 2016-01-17 MED ORDER — IBUPROFEN 200 MG PO TABS
400.0000 mg | ORAL_TABLET | Freq: Once | ORAL | Status: AC
  Administered 2016-01-17: 400 mg via ORAL
  Filled 2016-01-17: qty 2

## 2016-01-17 MED ORDER — TETRACAINE HCL 0.5 % OP SOLN
1.0000 [drp] | Freq: Once | OPHTHALMIC | Status: AC
  Administered 2016-01-17: 1 [drp] via OPHTHALMIC
  Filled 2016-01-17: qty 4

## 2016-01-17 MED ORDER — FLUORESCEIN SODIUM 1 MG OP STRP
1.0000 | ORAL_STRIP | Freq: Once | OPHTHALMIC | Status: AC
  Administered 2016-01-17: 1 via OPHTHALMIC
  Filled 2016-01-17: qty 1

## 2016-01-17 NOTE — ED Provider Notes (Signed)
CSN: [id number]     Arrival date & time 01/17/16  1804 History   By signing my name below, I, Marisue Humble, attest that this documentation has been prepared under the direction and in the presence of non-physician practitioner, Audry Pili, PA. Electronically Signed: Marisue Humble, Scribe. 01/17/2016. 6:48 PM.   Chief Complaint  Patient presents with  . Dental Pain  . Eye Pain   The history is provided by the patient. No language interpreter was used.   HPI Comments:  Kristi Marshall is a 30 y.o. female with PMHx of migraines and seasonal allergies who presents to the Emergency Department complaining of left eye pain, redness and "gooey" discharge for the past 2 days. She notes eye swelling yesterday and states her eye was closed when she woke up this morning. Pt reports pain with ROM of eye. Pt states she has been rubbing her eye frequently due to allergies. Pt reports associated intermittent, aching 10/10 right lower and left upper dental pain for the past year. She notes teeth in those areas have been chipping and breaking off since last year. Pt is able to eat with some difficulty. She has taken Ibuprofen and Tylenol without relief. Denies facial swelling, mouth swelling, double vision, fever, wearing contacts, or foreign body in eye.  Past Medical History  Diagnosis Date  . Anxiety   . PIH (pregnancy induced hypertension)   . Bronchitis   . Seasonal allergies   . Pregnancy induced hypertension   . Seizure (HCC)     pseudoseizures; stress related  . Pyelonephritis   . Headache     migraines   Past Surgical History  Procedure Laterality Date  . Abortions    . Dilation and curettage of uterus      ? retained POC    Family History  Problem Relation Age of Onset  . Hypertension Mother   . Diabetes Maternal Aunt   . Hypertension Maternal Grandmother   . Diabetes Maternal Grandmother   . Heart disease Maternal Grandmother   . Hypertension Maternal Grandfather   . Heart  disease Maternal Grandfather   . Anesthesia problems Neg Hx   . Hypotension Neg Hx   . Malignant hyperthermia Neg Hx   . Pseudochol deficiency Neg Hx   . Other Neg Hx   . Hearing loss Neg Hx    Social History  Substance Use Topics  . Smoking status: Current Some Day Smoker -- 7 years    Types: Cigarettes  . Smokeless tobacco: Never Used  . Alcohol Use: No   OB History    Gravida Para Term Preterm AB TAB SAB Ectopic Multiple Living   8 5 5  0 3 3 0 0 0 5     Review of Systems  Constitutional: Negative for fever.  HENT: Positive for dental problem. Negative for facial swelling.   Eyes: Positive for pain, discharge, redness and itching. Negative for visual disturbance.   Allergies  Bactrim; Ibuprofen; and Toradol  Home Medications   Prior to Admission medications   Medication Sig Start Date End Date Taking? Authorizing Provider  albuterol (PROVENTIL HFA;VENTOLIN HFA) 108 (90 BASE) MCG/ACT inhaler Inhale 2 puffs into the lungs every 6 (six) hours as needed. For shortness of breath 05/21/12   Antionette Char, MD  amitriptyline (ELAVIL) 50 MG tablet Take 1 tablet (50 mg total) by mouth at bedtime. 03/22/15   Linna Hoff, MD  amoxicillin (AMOXIL) 500 MG capsule Take 2 capsules (1,000 mg total) by mouth daily. 08/09/15  Samantha Tripp Dowless, PA-C  azithromycin (ZITHROMAX) 250 MG tablet Take 250-500 mg by mouth daily. Take s on day 1 then take 250 mgs daily for 4 days 09/18/15   Historical Provider, MD  cephALEXin (KEFLEX) 250 MG capsule Take 1 capsule (250 mg total) by mouth 4 (four) times daily. 09/30/15   Roxy Horseman, PA-C  ibuprofen (ADVIL,MOTRIN) 200 MG tablet Take 400 mg by mouth every 6 (six) hours as needed for headache or moderate pain.    Historical Provider, MD  naproxen sodium (ANAPROX) 220 MG tablet Take 220 mg by mouth daily as needed (pain).    Historical Provider, MD  Oxycodone HCl 10 MG TABS Take 10 mg by mouth 2 (two) times daily as needed. for pain 09/18/15    Historical Provider, MD  oxyCODONE-acetaminophen (PERCOCET/ROXICET) 5-325 MG tablet Take 2 tablets by mouth every 4 (four) hours as needed for severe pain. 08/09/15   Samantha Tripp Dowless, PA-C   BP 136/110 mmHg  Pulse 93  Temp(Src) 98.2 F (36.8 C) (Oral)  Resp 18  SpO2 99%  LMP 12/18/2015   Physical Exam  Constitutional: She is oriented to person, place, and time. She appears well-developed and well-nourished.  HENT:  Head: Normocephalic and atraumatic.  Mouth/Throat: Uvula is midline, oropharynx is clear and moist and mucous membranes are normal. No trismus in the jaw. Dental abscesses and dental caries present.  No Ludwig's, No trismus.  Able to tolerate PO. Full ROM of neck without difficulty.   Eyes: EOM are normal. Pupils are equal, round, and reactive to light. Left conjunctiva is injected.  Cardiovascular: Normal rate and regular rhythm.   Pulmonary/Chest: Effort normal. No respiratory distress.  Abdominal: Soft.  Musculoskeletal: Normal range of motion.  Neurological: She is alert and oriented to person, place, and time. Coordination normal.  Skin: Skin is warm and dry. No rash noted. She is not diaphoretic. No erythema.  Psychiatric: She has a normal mood and affect. Her behavior is normal. Thought content normal.  Nursing note and vitals reviewed.  ED Course  Procedures  DIAGNOSTIC STUDIES:  Oxygen Saturation is 99% on RA, normal by my interpretation.    COORDINATION OF CARE:  6:45 PM Will examine eye to r/o corneal abrasion. Will administer Penicillin, Ibuprofen and Tylenol. Discussed treatment plan with pt at bedside and pt agreed to plan.  7:28 PM Two drops of tetracaine/proparacaine instilled into affected eye.   Fluorescein strip applied to affected eye. Wood's lamp used to assess for corneal abrasion. No corneal abrasion identified. No foreign bodies noted. No visible hyphema.   Tonometry performed. Left eye pressure: 13  Patient tolerated procedure well  without immediate complication.    MDM  =I have reviewed the relevant previous healthcare records.  I obtained HPI from historian.  ED Course:  Assessment:  Dental pain associated with dental cary but no signs or symptoms of dental abscess with patient afebrile, non toxic appearing and swallowing secretions well. Exam unconcerning for Ludwig's angina or other deep tissue infection in neck. As there is gum swelling, erythema, and facial swelling, will treat with antibiotic and pain medicine. Urged patient to follow-up with dentist. I gave patient referral to dentist and stressed the importance of dental follow up for ultimate management of dental pain. Patient voices understanding and is agreeable to plan. Pt dx likely bacterial conjunctivitis based on presentation & eye exam. Given Rx ABX.  No evidence of HSV or VSV infection. Pt is not a contact lens wearer.  Exam non-concerning for  orbital cellulitis, hyphema, corneal ulcers, corneal abrasions or trauma.    Disposition/Plan:  DC Home Additional Verbal discharge instructions given and discussed with patient.  Pt Instructed to f/u with PCP/Dentist in the next week for evaluation and treatment of symptoms. Return precautions given Pt acknowledges and agrees with plan  Supervising Physician Lorre NickAnthony Allen, MD   Final diagnoses:  Bacterial conjunctivitis  Dental abscess   I personally performed the services described in this documentation, which was scribed in my presence. The recorded information has been reviewed and is accurate.   Audry Piliyler Alek Poncedeleon, PA-C 01/17/16 1934  Lorre NickAnthony Allen, MD 01/25/16 [phone]

## 2016-01-17 NOTE — Discharge Instructions (Signed)
Please read and follow all provided instructions.  Your diagnoses today include:  1. Bacterial conjunctivitis   2. Dental abscess    Tests performed today include:  Vital signs. See below for your results today.   Medications prescribed:  Take as prescribed   Home care instructions:  Follow any educational materials contained in this packet.  Follow-up instructions: Please follow-up with your primary care provider in the next week for further evaluation of symptoms and treatment   Follow up with Dentist (see below) for evaluation of abscess   Return instructions:   Please return to the Emergency Department if you do not get better, if you get worse, or new symptoms OR  - Fever (temperature greater than 101.2F)  - Bleeding that does not stop with holding pressure to the area    -Severe pain (please note that you may be more sore the day after your accident)  - Chest Pain  - Difficulty breathing  - Severe nausea or vomiting  - Inability to tolerate food and liquids  - Passing out  - Skin becoming red around your wounds  - Change in mental status (confusion or lethargy)  - New numbness or weakness     Please return if you have any other emergent concerns.  Additional Information:  Your vital signs today were: BP 136/110 mmHg   Pulse 93   Temp(Src) 98.2 F (36.8 C) (Oral)   Resp 18   SpO2 99%   LMP 12/18/2015 If your blood pressure (BP) was elevated above 135/85 this visit, please have this repeated by your doctor within one month. ---------------

## 2016-01-17 NOTE — ED Notes (Signed)
Pt c/o R lower and L upper dental pain x since last year and increasing L eye pain/irritation/drainage x 2 days.  Pain score 10/10.  Pt reports taking ibuprofen w/o relief.  Pt reports that "teeth keep breaking off," but unable to see dentist until next month.  Redness noted to L eye.

## 2016-02-22 ENCOUNTER — Emergency Department (HOSPITAL_COMMUNITY)
Admission: EM | Admit: 2016-02-22 | Discharge: 2016-02-22 | Disposition: A | Discharge status: Home / Self Care | Payer: Medicaid Other | Attending: Dermatology | Admitting: Dermatology

## 2016-02-22 ENCOUNTER — Emergency Department (HOSPITAL_COMMUNITY): Payer: Medicaid Other

## 2016-02-22 DIAGNOSIS — R05 Cough: Secondary | ICD-10-CM | POA: Diagnosis not present

## 2016-02-22 DIAGNOSIS — F17210 Nicotine dependence, cigarettes, uncomplicated: Secondary | ICD-10-CM | POA: Diagnosis not present

## 2016-02-22 DIAGNOSIS — Z5321 Procedure and treatment not carried out due to patient leaving prior to being seen by health care provider: Secondary | ICD-10-CM | POA: Diagnosis not present

## 2016-02-22 LAB — BASIC METABOLIC PANEL
ANION GAP: 4 — AB (ref 5–15)
BUN: 10 mg/dL (ref 6–20)
CALCIUM: 8.6 mg/dL — AB (ref 8.9–10.3)
CO2: 29 mmol/L (ref 22–32)
Chloride: 109 mmol/L (ref 101–111)
Creatinine, Ser: 0.73 mg/dL (ref 0.44–1.00)
GFR calc non Af Amer: >60 mL/min (ref >60)
Glucose, Bld: 94 mg/dL (ref 65–99)
POTASSIUM: 3.6 mmol/L (ref 3.5–5.1)
Sodium: 142 mmol/L (ref 135–145)

## 2016-02-22 LAB — CBC
HEMATOCRIT: 35.6 % — AB (ref 36.0–46.0)
HEMOGLOBIN: 11.7 g/dL — AB (ref 12.0–15.0)
MCH: 29 pg (ref 26.0–34.0)
MCHC: 32.9 g/dL (ref 30.0–36.0)
MCV: 88.1 fL (ref 78.0–100.0)
Platelets: 323 10*3/uL (ref 150–400)
RBC: 4.04 MIL/uL (ref 3.87–5.11)
RDW: 14.3 % (ref 11.5–15.5)
WBC: 5.5 10*3/uL (ref 4.0–10.5)

## 2016-02-22 LAB — I-STAT TROPONIN, ED: TROPONIN I, POC: 0 ng/mL (ref 0.00–0.08)

## 2016-02-22 NOTE — ED Notes (Signed)
Patient called to room multiple times by multiple staff members. Not in lobby.

## 2016-02-22 NOTE — ED Notes (Signed)
Patient with hx of chronic bronchitis c/o exacerbation of cough with thick mucus with red specks, SOB, chest pain reproducible with palpation. Positive barrel test.

## 2016-08-23 ENCOUNTER — Inpatient Hospital Stay (HOSPITAL_COMMUNITY)
Admission: AD | Admit: 2016-08-23 | Discharge: 2016-08-23 | Disposition: A | Discharge status: Home / Self Care | Payer: Medicaid Other | Source: Ambulatory Visit | Attending: Family Medicine | Admitting: Family Medicine

## 2016-08-23 ENCOUNTER — Encounter (HOSPITAL_COMMUNITY): Payer: Self-pay | Admitting: *Deleted

## 2016-08-23 DIAGNOSIS — Z3201 Encounter for pregnancy test, result positive: Secondary | ICD-10-CM

## 2016-08-23 DIAGNOSIS — Z3492 Encounter for supervision of normal pregnancy, unspecified, second trimester: Secondary | ICD-10-CM

## 2016-08-23 DIAGNOSIS — O99712 Diseases of the skin and subcutaneous tissue complicating pregnancy, second trimester: Secondary | ICD-10-CM | POA: Insufficient documentation

## 2016-08-23 DIAGNOSIS — Z87891 Personal history of nicotine dependence: Secondary | ICD-10-CM | POA: Insufficient documentation

## 2016-08-23 DIAGNOSIS — R222 Localized swelling, mass and lump, trunk: Secondary | ICD-10-CM | POA: Diagnosis present

## 2016-08-23 DIAGNOSIS — Z3A16 16 weeks gestation of pregnancy: Secondary | ICD-10-CM | POA: Insufficient documentation

## 2016-08-23 DIAGNOSIS — L739 Follicular disorder, unspecified: Secondary | ICD-10-CM | POA: Insufficient documentation

## 2016-08-23 LAB — URINALYSIS, ROUTINE W REFLEX MICROSCOPIC
BILIRUBIN URINE: NEGATIVE
Glucose, UA: NEGATIVE mg/dL
KETONES UR: NEGATIVE mg/dL
LEUKOCYTES UA: NEGATIVE
NITRITE: NEGATIVE
PH: 6 (ref 5.0–8.0)
Protein, ur: NEGATIVE mg/dL
SPECIFIC GRAVITY, URINE: 1.013 (ref 1.005–1.030)

## 2016-08-23 MED ORDER — ACETAMINOPHEN 500 MG PO TABS
1000.0000 mg | ORAL_TABLET | Freq: Four times a day (QID) | ORAL | Status: DC | PRN
  Administered 2016-08-23: 1000 mg via ORAL
  Filled 2016-08-23: qty 2

## 2016-08-23 MED ORDER — CEPHALEXIN 500 MG PO CAPS: 500.0000 mg | ORAL_CAPSULE | Freq: Four times a day (QID) | ORAL | 0 refills | Status: AC

## 2016-08-23 NOTE — MAU Note (Signed)
+  HPT in Sept.  Took a while to come to grips with this, had other plans- but  Plans changed.  Has lumps in arm pits, 2 on one side, 5 on the other.they are painful. Noted after shaving

## 2016-08-23 NOTE — MAU Provider Note (Signed)
History     CSN: [id number]  Arrival date and time: 08/23/16 1345   None     Chief Complaint  Patient presents with  . lumps in armpits  . Possible Pregnancy   [id number] @[redacted]w[redacted]d  by LMP here with painful lumps and bumps under armpits. She shaved 3 days ago then noticed the lumps and bumps a day later. She reports using a new razor and did not share with anyone. She reports areas of erythema and edema. She denies exudate. She reports +FM. She denies VB and pain. She has not started care to date.   OB History    Gravida Para Term Preterm AB Living   9 5 5  0 3 5   SAB TAB Ectopic Multiple Live Births   0 3 0 0 5      Past Medical History:  Diagnosis Date  . Anxiety   . Bronchitis   . Headache    migraines  . PIH (pregnancy induced hypertension)   . Pregnancy induced hypertension   . Pyelonephritis   . Seasonal allergies   . 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 abortions    . DILATION AND CURETTAGE OF UTERUS     ? retained POC     Family History  Problem Relation Age of Onset  . Hypertension Mother   . Diabetes Maternal Aunt   . Hypertension Maternal Grandmother   . Diabetes Maternal Grandmother   . Heart disease Maternal Grandmother   . Hypertension Maternal Grandfather   . Heart disease Maternal Grandfather   . Anesthesia problems Neg Hx   . Hypotension Neg Hx   . Malignant hyperthermia Neg Hx   . Pseudochol deficiency Neg Hx   . Other Neg Hx   . Hearing loss Neg Hx     Social History  Substance Use Topics  . Smoking status: Former Smoker    Years: 7.00    Types: Cigarettes    Quit date: 05/24/2016  . Smokeless tobacco: Never Used  . Alcohol use No    Allergies:  Allergies  Allergen Reactions  . Bactrim [Sulfamethoxazole-Trimethoprim] Nausea And Vomiting and Other (See Comments)    high fevers.   . Ibuprofen Itching  . Toradol [Ketorolac Tromethamine] Itching     Prescriptions Prior to Admission  Medication Sig Dispense Refill Last Dose  . albuterol (PROVENTIL HFA;VENTOLIN HFA) 108 (90 BASE) MCG/ACT inhaler Inhale 2 puffs into the lungs every 6 (six) hours as needed. For shortness of breath (Patient not taking: Reported on 02/22/2016) 1 Inhaler 2 Not Taking at Unknown time  . amitriptyline (ELAVIL) 50 MG tablet Take 1 tablet (50 mg total) by mouth at bedtime. (Patient not taking: Reported on 02/22/2016) 10 tablet 1 Not Taking at Unknown time  . amoxicillin (AMOXIL) 500 MG capsule Take 2 capsules (1,000 mg total) by mouth daily. (Patient not taking: Reported on 02/22/2016) 20 capsule 0 Completed Course at Unknown time  . cephALEXin (KEFLEX) 250 MG capsule Take 1 capsule (250 mg total) by mouth 4 (four) times daily. (Patient not taking: Reported on 02/22/2016) 28 capsule 0 Completed Course at Unknown time  . erythromycin ophthalmic ointment Place a 1/2 inch ribbon of ointment into the lower eyelid. Four times per day For 5 days (Patient not taking: Reported on 02/22/2016) 1 g 0 Completed Course at Unknown time  . ibuprofen (ADVIL,MOTRIN) 200 MG tablet Take 400 mg by mouth every 6 (six) hours  as needed for headache or moderate pain.   Past Month at Unknown time  . naproxen sodium (ANAPROX) 220 MG tablet Take 220 mg by mouth daily as needed (pain).   Past Month at Unknown time  . Oxycodone HCl 10 MG TABS Take 10 mg by mouth 2 (two) times daily as needed. for pain  0 Past Week at Unknown time  . oxyCODONE-acetaminophen (PERCOCET/ROXICET) 5-325 MG tablet Take 2 tablets by mouth every 4 (four) hours as needed for severe pain. (Patient not taking: Reported on 02/22/2016) 6 tablet 0 Completed Course at Unknown time    Review of Systems  Constitutional: Negative.  Negative for chills and fever.  Neurological: Positive for headaches.   Physical Exam   Blood pressure 115/70, pulse 93, temperature 98.5 F (36.9 C), temperature source Oral, resp. rate 18, last  menstrual period 04/27/2016, unknown if currently breastfeeding.  Physical Exam  Constitutional: She is oriented to person, place, and time. She appears well-developed and well-nourished. No distress.  HENT:  Head: Normocephalic and atraumatic.  Neck: Normal range of motion. Neck supple.  Cardiovascular: Normal rate.   Respiratory: Effort normal. Right breast exhibits no inverted nipple, no mass, no nipple discharge, no skin change and no tenderness. Left breast exhibits no inverted nipple, no mass, no nipple discharge, no skin change and no tenderness. Breasts are symmetrical.  Musculoskeletal: Normal range of motion.  Lymphadenopathy:  Right axilla: multiple (5+) firm non-fluctuant nodules, no erythema, mild edema Left axilla: 2 firm non-fluctuant nodules, no erythema, mild edema Multiple small papular non-pustular lesions bilaterally  Neurological: She is alert and oriented to person, place, and time.  Skin: Skin is warm and dry.  Psychiatric: She has a normal mood and affect.    FHT: 152 bpm  MAU Course  Procedures  MDM Will treat for folliculitis. Recommend f/u with PCP but doesn't have one, therefore [facility] if sx persist or worsen after abx. Stable for discharge home.  Assessment and Plan   1. Folliculitis   2. Second trimester pregnancy    Discharge home Keflex 500 mg po q6 hrs x10 days Warm compresses TID Tylenol prn Follow up with OB provider of choice [facility] sx  Donette LarryMelanie Teyah Rossy, CNM 08/23/2016, 4:42 PM

## 2016-08-23 NOTE — Discharge Instructions (Signed)

## 2016-09-13 NOTE — L&D Delivery Note (Signed)
Patietn is a 31 y/o G8P6 who presents in active labor. She had several MAU visits, but no prenatal care this pregnancy.  Delivery Note At 12:24 PM a viable female was delivered via Vaginal, Spontaneous Delivery (Presentation:vertex ; OA  ).  APGAR: 8, 9; weight 7 lb 0.7 oz (3195 g).   Placenta status: delivered intact with gentle traction  Cord:  3 vessle with the following complications: none .  Cord pH: not collected  Anesthesia:  epidural Episiotomy: None Lacerations: Periurethral Est. Blood Loss (mL): 150  Mom to postpartum.  Baby to Couplet care / Skin to Skin.  Ernestina Pennaicholas Tc [place] 01/14/2017, 1:38 PM

## 2016-09-23 ENCOUNTER — Encounter: Payer: Medicaid Other | Admitting: Obstetrics & Gynecology

## 2016-12-17 ENCOUNTER — Inpatient Hospital Stay (HOSPITAL_COMMUNITY)
Admission: AD | Admit: 2016-12-17 | Discharge: 2016-12-17 | Disposition: A | Discharge status: Home / Self Care | Payer: Medicaid Other | Source: Ambulatory Visit | Attending: Obstetrics and Gynecology | Admitting: Obstetrics and Gynecology

## 2016-12-17 ENCOUNTER — Encounter (HOSPITAL_COMMUNITY): Payer: Self-pay | Admitting: *Deleted

## 2016-12-17 DIAGNOSIS — Z882 Allergy status to sulfonamides status: Secondary | ICD-10-CM | POA: Diagnosis not present

## 2016-12-17 DIAGNOSIS — O0933 Supervision of pregnancy with insufficient antenatal care, third trimester: Secondary | ICD-10-CM | POA: Insufficient documentation

## 2016-12-17 DIAGNOSIS — O26893 Other specified pregnancy related conditions, third trimester: Secondary | ICD-10-CM | POA: Insufficient documentation

## 2016-12-17 DIAGNOSIS — Z3A33 33 weeks gestation of pregnancy: Secondary | ICD-10-CM | POA: Diagnosis not present

## 2016-12-17 DIAGNOSIS — R1032 Left lower quadrant pain: Secondary | ICD-10-CM | POA: Insufficient documentation

## 2016-12-17 DIAGNOSIS — Z87891 Personal history of nicotine dependence: Secondary | ICD-10-CM | POA: Diagnosis not present

## 2016-12-17 DIAGNOSIS — Z8249 Family history of ischemic heart disease and other diseases of the circulatory system: Secondary | ICD-10-CM | POA: Diagnosis not present

## 2016-12-17 DIAGNOSIS — R1031 Right lower quadrant pain: Secondary | ICD-10-CM | POA: Insufficient documentation

## 2016-12-17 DIAGNOSIS — R109 Unspecified abdominal pain: Secondary | ICD-10-CM

## 2016-12-17 DIAGNOSIS — R102 Pelvic and perineal pain: Secondary | ICD-10-CM | POA: Diagnosis present

## 2016-12-17 DIAGNOSIS — Z888 Allergy status to other drugs, medicaments and biological substances status: Secondary | ICD-10-CM | POA: Insufficient documentation

## 2016-12-17 LAB — WET PREP, GENITAL
Clue Cells Wet Prep HPF POC: NONE SEEN
SPERM: NONE SEEN
Trich, Wet Prep: NONE SEEN
YEAST WET PREP: NONE SEEN

## 2016-12-17 LAB — URINALYSIS, ROUTINE W REFLEX MICROSCOPIC
BILIRUBIN URINE: NEGATIVE
Glucose, UA: 50 mg/dL — AB
HGB URINE DIPSTICK: NEGATIVE
Ketones, ur: NEGATIVE mg/dL
Leukocytes, UA: NEGATIVE
Nitrite: NEGATIVE
PH: 7 (ref 5.0–8.0)
Protein, ur: NEGATIVE mg/dL
SPECIFIC GRAVITY, URINE: 1.014 (ref 1.005–1.030)

## 2016-12-17 LAB — OB RESULTS CONSOLE GC/CHLAMYDIA: Gonorrhea: NEGATIVE

## 2016-12-17 NOTE — MAU Provider Note (Signed)
Patient Kristi Marshall is a 31 year old G9P5035 at 33 weeks and 3 days here with complaints of abdominal pressure and pain that radiates to her left lower quadrant and right lower quadrant. She has not had any prenatal care and is planning to put the baby up for adoption.  History     CSN: [id number]  Arrival date and time: 12/17/16 1938   None     Chief Complaint  Patient presents with  . Pelvic Pain   Pelvic Pain  The patient's primary symptoms include pelvic pain. The patient's pertinent negatives include no genital itching, genital lesions, genital odor, genital rash, missed menses, vaginal bleeding or vaginal discharge. This is a new problem. The current episode started 1 to 4 weeks ago. The problem occurs intermittently. The problem has been gradually worsening. Associated symptoms include abdominal pain. Pertinent negatives include no back pain, diarrhea, discolored urine, headaches, hematuria, nausea or vomiting.  Abdominal Pain  This is a new problem. The current episode started 1 to 4 weeks ago. The onset quality is gradual. The problem occurs intermittently. The problem has been gradually worsening. The pain is located in the LLQ, RLQ and suprapubic region. The pain is at a severity of 5/10. The quality of the pain is cramping. Pertinent negatives include no diarrhea, headaches, hematuria, nausea or vomiting. She has tried nothing for the symptoms.    OB History    Gravida Para Term Preterm AB Living   0 3 5   SAB TAB Ectopic Multiple Live Births   0 3 0 0 5      Past Medical History:  Diagnosis Date  . Anxiety   . Bronchitis   . Headache    migraines  . PIH (pregnancy induced hypertension)   . Pregnancy induced hypertension   . Pyelonephritis   . Seasonal allergies   . Seizure [facility])    pseudoseizures; stress related    Past Surgical History:  Procedure Laterality Date  . abortions    . DILATION AND CURETTAGE OF UTERUS     ? retained POC     Family  History  Problem Relation Age of Onset  . Hypertension Mother   . Diabetes Maternal Aunt   . Hypertension Maternal Grandmother   . Diabetes Maternal Grandmother   . Heart disease Maternal Grandmother   . Hypertension Maternal Grandfather   . Heart disease Maternal Grandfather   . Anesthesia problems Neg Hx   . Hypotension Neg Hx   . Malignant hyperthermia Neg Hx   . Pseudochol deficiency Neg Hx   . Other Neg Hx   . Hearing loss Neg Hx     Social History  Substance Use Topics  . Smoking status: Former Smoker    Years: 7.00    Types: Cigarettes    Quit date: 05/24/2016  . Smokeless tobacco: Never Used     Comment: Second hand smoke  . Alcohol use No    Allergies:  Allergies  Allergen Reactions  . Bactrim [Sulfamethoxazole-Trimethoprim] Nausea And Vomiting and Other (See Comments)    high fevers.   . Ibuprofen Itching  . Toradol [Ketorolac Tromethamine] Itching    Prescriptions Prior to Admission  Medication Sig Dispense Refill Last Dose  . acetaminophen (TYLENOL) 500 MG tablet Take 500 mg by mouth every 6 (six) hours as needed for mild pain or headache.   Past Week at Unknown time  . albuterol (PROVENTIL HFA;VENTOLIN HFA) Chelsia Serres) MCG/ACT inhaler Inhale 2 puffs into  the lungs every 6 (six) hours as needed for wheezing or shortness of breath.   Rescue  . bismuth subsalicylate (PEPTO BISMOL) 262 MG/15ML suspension Take 30 mLs by mouth every 6 (six) hours as needed for indigestion.   Past Week at Unknown time  . Oxycodone HCl 10 MG TABS Take by mouth.  0 12/17/2016 at Unknown time    Review of Systems  Constitutional: Negative.   HENT: Negative.   Respiratory: Negative.   Gastrointestinal: Positive for abdominal pain. Negative for diarrhea, nausea and vomiting.  Endocrine: Negative.   Genitourinary: Positive for pelvic pain. Negative for hematuria, missed menses and vaginal discharge.  Musculoskeletal: Negative for back pain.  Neurological: Negative for headaches.   Hematological: Negative.    Physical Exam   Blood pressure 135/77, pulse (!) 114, temperature 98.3 F (36.8 C), temperature source Oral, resp. rate 18, height  (1.626 m), weight 85.3 kg (188 lb), last menstrual period 04/27/2016, unknown if currently breastfeeding.  Physical Exam  Constitutional: She is oriented to person, place, and time. She appears well-developed and well-nourished.  HENT:  Head: Normocephalic.  Eyes: Pupils are equal, round, and reactive to light.  Respiratory: Effort normal.  GI: Soft. She exhibits no distension and no mass. There is no tenderness. There is no rebound and no guarding.  Genitourinary:  Genitourinary Comments: NEFG; vaginal walls pink with no lesions; scant milky white discharge in the vagina, cervix is pink with no lesions. No CMT, no adnexal tenderness, no suprapubic tenderness. Cervix is 2 cm, long and thick.   Musculoskeletal: Normal range of motion.  Neurological: She is alert and oriented to person, place, and time.  Skin: Skin is warm and dry.    MAU Course  Procedures Results for orders placed or performed during the hospital encounter of 12/17/16 (from the past 24 hour(s))  Urinalysis, Routine w reflex microscopic     Status: Abnormal   Collection Time: 12/17/16  7:38 PM  Result Value Ref Range   Color, Urine YELLOW YELLOW   APPearance CLEAR CLEAR   Specific Gravity, Urine 1.014 1.005 - 1.030   pH 7.0 5.0 - 8.0   Glucose, UA 50 (A) NEGATIVE mg/dL   Hgb urine dipstick NEGATIVE NEGATIVE   Bilirubin Urine NEGATIVE NEGATIVE   Ketones, ur NEGATIVE NEGATIVE mg/dL   Protein, ur NEGATIVE NEGATIVE mg/dL   Nitrite NEGATIVE NEGATIVE   Leukocytes, UA NEGATIVE NEGATIVE  Wet prep, genital     Status: Abnormal   Collection Time: 12/17/16  8:20 PM  Result Value Ref Range   Yeast Wet Prep HPF POC NONE SEEN NONE SEEN   Trich, Wet Prep NONE SEEN NONE SEEN   Clue Cells Wet Prep HPF POC NONE SEEN NONE SEEN   WBC, Wet Prep HPF POC FEW (A)  NONE SEEN   Sperm NONE SEEN     MDM -UA -GC Ct pending -wet prep pending -NST: baseline 145, moderate variability, accels present, no decels  Assessment and Plan   1. Prenatal care insufficient, third trimester   2. Abdominal pain during pregnancy in third trimester      Patient care endorsed to [facility] FNP at 2035.  Negative wet prep & category 1 tracing. Will discharge home Judeth Horn, NP     3. WIll send message to [facility] to begin prenatal care; patient prefers [facility] site for privacy reason. Anatomy scan ordered.   Charlesetta Garibaldi Kooistra 12/17/2016, 8:24 PM

## 2016-12-17 NOTE — MAU Note (Signed)
Pt c/o increased pelvic pain and pressure that has been getting worse over the past week. Good fetal movement reported. Has not gone to her prenatal appointments

## 2016-12-17 NOTE — Discharge Instructions (Signed)
Third Trimester of Pregnancy The third trimester is from week 28 through week 40 (months 7 through 9). The third trimester is a time when the unborn baby (fetus) is growing rapidly. At the end of the ninth month, the fetus is about 20 inches in length and weighs 6-10 pounds. Body changes during your third trimester Your body will continue to go through many changes during pregnancy. The changes vary from woman to woman. During the third trimester:  Your weight will continue to increase. You can expect to gain 25-35 pounds (11-16 kg) by the end of the pregnancy.  You may begin to get stretch marks on your hips, abdomen, and breasts.  You may urinate more often because the fetus is moving lower into your pelvis and pressing on your bladder.  You may develop or continue to have heartburn. This is caused by increased hormones that slow down muscles in the digestive tract.  You may develop or continue to have constipation because increased hormones slow digestion and cause the muscles that push waste through your intestines to relax.  You may develop hemorrhoids. These are swollen veins (varicose veins) in the rectum that can itch or be painful.  You may develop swollen, bulging veins (varicose veins) in your legs.  You may have increased body aches in the pelvis, back, or thighs. This is due to weight gain and increased hormones that are relaxing your joints.  You may have changes in your hair. These can include thickening of your hair, rapid growth, and changes in texture. Some women also have hair loss during or after pregnancy, or hair that feels dry or thin. Your hair will most likely return to normal after your baby is born.  Your breasts will continue to grow and they will continue to become tender. A yellow fluid (colostrum) may leak from your breasts. This is the first milk you are producing for your baby.  Your belly button may stick out.  You may notice more swelling in your hands,  face, or ankles.  You may have increased tingling or numbness in your hands, arms, and legs. The skin on your belly may also feel numb.  You may feel short of breath because of your expanding uterus.  You may have more problems sleeping. This can be caused by the size of your belly, increased need to urinate, and an increase in your body's metabolism.  You may notice the fetus "dropping," or moving lower in your abdomen (lightening).  You may have increased vaginal discharge.  You may notice your joints feel loose and you may have pain around your pelvic bone.  What to expect at prenatal visits You will have prenatal exams every 2 weeks until week 36. Then you will have weekly prenatal exams. During a routine prenatal visit:  You will be weighed to make sure you and the baby are growing normally.  Your blood pressure will be taken.  Your abdomen will be measured to track your baby's growth.  The fetal heartbeat will be listened to.  Any test results from the previous visit will be discussed.  You may have a cervical check near your due date to see if your cervix has softened or thinned (effaced).  You will be tested for Group B streptococcus. This happens between 35 and 37 weeks.  Your health care provider may ask you:  What your birth plan is.  How you are feeling.  If you are feeling the baby move.  If you have had   any abnormal symptoms, such as leaking fluid, bleeding, severe headaches, or abdominal cramping.  If you are using any tobacco products, including cigarettes, chewing tobacco, and electronic cigarettes.  If you have any questions.  Other tests or screenings that may be performed during your third trimester include:  Blood tests that check for low iron levels (anemia).  Fetal testing to check the health, activity level, and growth of the fetus. Testing is done if you have certain medical conditions or if there are problems during the  pregnancy.  Nonstress test (NST). This test checks the health of your baby to make sure there are no signs of problems, such as the baby not getting enough oxygen. During this test, a belt is placed around your belly. The baby is made to move, and its heart rate is monitored during movement.  What is false labor? False labor is a condition in which you feel small, irregular tightenings of the muscles in the womb (contractions) that usually go away with rest, changing position, or drinking water. These are called Braxton Hicks contractions. Contractions may last for hours, days, or even weeks before true labor sets in. If contractions come at regular intervals, become more frequent, increase in intensity, or become painful, you should see your health care provider. What are the signs of labor?  Abdominal cramps.  Regular contractions that start at 10 minutes apart and become stronger and more frequent with time.  Contractions that start on the top of the uterus and spread down to the lower abdomen and back.  Increased pelvic pressure and dull back pain.  A watery or bloody mucus discharge that comes from the vagina.  Leaking of amniotic fluid. This is also known as your "water breaking." It could be a slow trickle or a gush. Let your health care provider know if it has a color or strange odor. If you have any of these signs, call your health care provider right away, even if it is before your due date. Follow these instructions at home: Medicines  Follow your health care provider's instructions regarding medicine use. Specific medicines may be either safe or unsafe to take during pregnancy.  Take a prenatal vitamin that contains at least 600 micrograms (mcg) of folic acid.  If you develop constipation, try taking a stool softener if your health care provider approves. Eating and drinking  Eat a balanced diet that includes fresh fruits and vegetables, whole grains, good sources of protein  such as meat, eggs, or tofu, and low-fat dairy. Your health care provider will help you determine the amount of weight gain that is right for you.  Avoid raw meat and uncooked cheese. These carry germs that can cause birth defects in the baby.  If you have low calcium intake from food, talk to your health care provider about whether you should take a daily calcium supplement.  Eat four or five small meals rather than three large meals a day.  Limit foods that are high in fat and processed sugars, such as fried and sweet foods.  To prevent constipation: ? Drink enough fluid to keep your urine clear or pale yellow. ? Eat foods that are high in fiber, such as fresh fruits and vegetables, whole grains, and beans. Activity  Exercise only as directed by your health care provider. Most women can continue their usual exercise routine during pregnancy. Try to exercise for 30 minutes at least 5 days a week. Stop exercising if you experience uterine contractions.  Avoid heavy   lifting.  Do not exercise in extreme heat or humidity, or at high altitudes.  Wear low-heel, comfortable shoes.  Practice good posture.  You may continue to have sex unless your health care provider tells you otherwise. Relieving pain and discomfort  Take frequent breaks and rest with your legs elevated if you have leg cramps or low back pain.  Take warm sitz baths to soothe any pain or discomfort caused by hemorrhoids. Use hemorrhoid cream if your health care provider approves.  Wear a good support bra to prevent discomfort from breast tenderness.  If you develop varicose veins: ? Wear support pantyhose or compression stockings as told by your healthcare provider. ? Elevate your feet for 15 minutes, 3-4 times a day. Prenatal care  Write down your questions. Take them to your prenatal visits.  Keep all your prenatal visits as told by your health care provider. This is important. Safety  Wear your seat belt at  all times when driving.  Make a list of emergency phone numbers, including numbers for family, friends, the hospital, and police and fire departments. General instructions  Avoid cat litter boxes and soil used by cats. These carry germs that can cause birth defects in the baby. If you have a cat, ask someone to clean the litter box for you.  Do not travel far distances unless it is absolutely necessary and only with the approval of your health care provider.  Do not use hot tubs, steam rooms, or saunas.  Do not drink alcohol.  Do not use any products that contain nicotine or tobacco, such as cigarettes and e-cigarettes. If you need help quitting, ask your health care provider.  Do not use any medicinal herbs or unprescribed drugs. These chemicals affect the formation and growth of the baby.  Do not douche or use tampons or scented sanitary pads.  Do not cross your legs for long periods of time.  To prepare for the arrival of your baby: ? Take prenatal classes to understand, practice, and ask questions about labor and delivery. ? Make a trial run to the hospital. ? Visit the hospital and tour the maternity area. ? Arrange for maternity or paternity leave through employers. ? Arrange for family and friends to take care of pets while you are in the hospital. ? Purchase a rear-facing car seat and make sure you know how to install it in your car. ? Pack your hospital bag. ? Prepare the baby's nursery. Make sure to remove all pillows and stuffed animals from the baby's crib to prevent suffocation.  Visit your dentist if you have not gone during your pregnancy. Use a soft toothbrush to brush your teeth and be gentle when you floss. Contact a health care provider if:  You are unsure if you are in labor or if your water has broken.  You become dizzy.  You have mild pelvic cramps, pelvic pressure, or nagging pain in your abdominal area.  You have lower back pain.  You have persistent  nausea, vomiting, or diarrhea.  You have an unusual or bad smelling vaginal discharge.  You have pain when you urinate. Get help right away if:  Your water breaks before 37 weeks.  You have regular contractions less than 5 minutes apart before 37 weeks.  You have a fever.  You are leaking fluid from your vagina.  You have spotting or bleeding from your vagina.  You have severe abdominal pain or cramping.  You have rapid weight loss or weight gain.    You have shortness of breath with chest pain.  You notice sudden or extreme swelling of your face, hands, ankles, feet, or legs.  Your baby makes fewer than 10 movements in 2 hours.  You have severe headaches that do not go away when you take medicine.  You have vision changes. Summary  The third trimester is from week 28 through week 40, months 7 through 9. The third trimester is a time when the unborn baby (fetus) is growing rapidly.  During the third trimester, your discomfort may increase as you and your baby continue to gain weight. You may have abdominal, leg, and back pain, sleeping problems, and an increased need to urinate.  During the third trimester your breasts will keep growing and they will continue to become tender. A yellow fluid (colostrum) may leak from your breasts. This is the first milk you are producing for your baby.  False labor is a condition in which you feel small, irregular tightenings of the muscles in the womb (contractions) that eventually go away. These are called Braxton Hicks contractions. Contractions may last for hours, days, or even weeks before true labor sets in.  Signs of labor can include: abdominal cramps; regular contractions that start at 10 minutes apart and become stronger and more frequent with time; watery or bloody mucus discharge that comes from the vagina; increased pelvic pressure and dull back pain; and leaking of amniotic fluid. This information is not intended to replace advice  given to you by your health care provider. Make sure you discuss any questions you have with your health care provider. Document Released: 08/24/2001 Document Revised: 02/05/2016 Document Reviewed: 10/31/2012 Elsevier Interactive Patient Education  2017 [employer]  

## 2016-12-20 ENCOUNTER — Ambulatory Visit (HOSPITAL_COMMUNITY)
Admission: RE | Admit: 2016-12-20 | Discharge: 2016-12-20 | Disposition: A | Discharge status: Home / Self Care | Payer: Medicaid Other | Source: Ambulatory Visit | Attending: Student | Admitting: Student

## 2016-12-20 DIAGNOSIS — O0933 Supervision of pregnancy with insufficient antenatal care, third trimester: Secondary | ICD-10-CM | POA: Insufficient documentation

## 2016-12-20 DIAGNOSIS — O99353 Diseases of the nervous system complicating pregnancy, third trimester: Secondary | ICD-10-CM | POA: Insufficient documentation

## 2016-12-20 DIAGNOSIS — O99333 Smoking (tobacco) complicating pregnancy, third trimester: Secondary | ICD-10-CM | POA: Diagnosis not present

## 2016-12-20 DIAGNOSIS — Z3A35 35 weeks gestation of pregnancy: Secondary | ICD-10-CM | POA: Insufficient documentation

## 2016-12-20 DIAGNOSIS — G40909 Epilepsy, unspecified, not intractable, without status epilepticus: Secondary | ICD-10-CM | POA: Insufficient documentation

## 2016-12-20 LAB — GC/CHLAMYDIA PROBE AMP (~~LOC~~) NOT AT ARMC
Chlamydia: NEGATIVE
NEISSERIA GONORRHEA: NEGATIVE

## 2016-12-21 ENCOUNTER — Telehealth: Payer: Self-pay | Admitting: *Deleted

## 2016-12-21 NOTE — Telephone Encounter (Signed)
Per message from ultrasound need to call patient with results of her [place]( was seen in [place]). I called patient and left a message we were calling with some information- she can call us back or discuss at her next appointment - which is scheduled for tomorrow at [facility]. I also sent  A message to clinical staff at [facility] and placed note on her appt.

## 2016-12-22 ENCOUNTER — Encounter: Payer: Medicaid Other | Admitting: Student

## 2016-12-26 ENCOUNTER — Encounter (HOSPITAL_COMMUNITY): Payer: Self-pay | Admitting: *Deleted

## 2016-12-26 ENCOUNTER — Inpatient Hospital Stay (HOSPITAL_COMMUNITY)
Admission: AD | Admit: 2016-12-26 | Discharge: 2016-12-27 | Disposition: A | Discharge status: Home / Self Care | Payer: Medicaid Other | Source: Ambulatory Visit | Attending: Family Medicine | Admitting: Family Medicine

## 2016-12-26 DIAGNOSIS — O4703 False labor before 37 completed weeks of gestation, third trimester: Secondary | ICD-10-CM | POA: Insufficient documentation

## 2016-12-26 DIAGNOSIS — Z3A34 34 weeks gestation of pregnancy: Secondary | ICD-10-CM

## 2016-12-26 DIAGNOSIS — Z87891 Personal history of nicotine dependence: Secondary | ICD-10-CM | POA: Diagnosis not present

## 2016-12-26 DIAGNOSIS — Z882 Allergy status to sulfonamides status: Secondary | ICD-10-CM | POA: Diagnosis not present

## 2016-12-26 LAB — URINALYSIS, ROUTINE W REFLEX MICROSCOPIC
Bacteria, UA: NONE SEEN
Bilirubin Urine: NEGATIVE
GLUCOSE, UA: NEGATIVE mg/dL
Hgb urine dipstick: NEGATIVE
Ketones, ur: NEGATIVE mg/dL
LEUKOCYTES UA: NEGATIVE
NITRITE: NEGATIVE
PH: 7 (ref 5.0–8.0)
PROTEIN: NEGATIVE mg/dL
Specific Gravity, Urine: 1.02 (ref 1.005–1.030)

## 2016-12-26 NOTE — MAU Note (Addendum)
Pt presents via EMS with complaints of bloody mucous discharge around 9:30pm and lower abdominal cramping that started shortly after. States she got frustrated at FOB and went back to bathroom and felt like underwear were damp. Unsure if water broke. Pt is not wearing pad in triage. Pt gets [facility] in [place], but missed appointment last week.

## 2016-12-27 DIAGNOSIS — O4703 False labor before 37 completed weeks of gestation, third trimester: Secondary | ICD-10-CM | POA: Diagnosis not present

## 2016-12-27 LAB — CBC
HCT: 28 % — ABNORMAL LOW (ref 36.0–46.0)
Hemoglobin: 9.3 g/dL — ABNORMAL LOW (ref 12.0–15.0)
MCH: 29.7 pg (ref 26.0–34.0)
MCHC: 33.2 g/dL (ref 30.0–36.0)
MCV: 89.5 fL (ref 78.0–100.0)
PLATELETS: 334 10*3/uL (ref 150–400)
RBC: 3.13 MIL/uL — AB (ref 3.87–5.11)
RDW: 13.6 % (ref 11.5–15.5)
WBC: 12.4 10*3/uL — ABNORMAL HIGH (ref 4.0–10.5)

## 2016-12-27 LAB — DIFFERENTIAL
BASOS PCT: 0 %
Basophils Absolute: 0 10*3/uL (ref 0.0–0.1)
EOS PCT: 1 %
Eosinophils Absolute: 0.1 10*3/uL (ref 0.0–0.7)
LYMPHS PCT: 24 %
Lymphs Abs: 2.9 10*3/uL (ref 0.7–4.0)
MONO ABS: 0.3 10*3/uL (ref 0.1–1.0)
Monocytes Relative: 3 %
NEUTROS ABS: 9.1 10*3/uL — AB (ref 1.7–7.7)
NEUTROS PCT: 72 %

## 2016-12-27 LAB — RAPID URINE DRUG SCREEN, HOSP PERFORMED
Amphetamines: NOT DETECTED
Barbiturates: NOT DETECTED
Benzodiazepines: NOT DETECTED
COCAINE: NOT DETECTED
OPIATES: POSITIVE — AB
Tetrahydrocannabinol: NOT DETECTED

## 2016-12-27 LAB — HEPATITIS B SURFACE ANTIGEN: Hepatitis B Surface Ag: NEGATIVE

## 2016-12-27 LAB — OB RESULTS CONSOLE GBS: GBS: NEGATIVE

## 2016-12-27 MED ORDER — ACETAMINOPHEN 500 MG PO TABS
1000.0000 mg | ORAL_TABLET | Freq: Once | ORAL | Status: AC
Start: 2016-12-27 — End: 2016-12-27
  Administered 2016-12-27: 1000 mg via ORAL
  Filled 2016-12-27: qty 2

## 2016-12-27 MED ORDER — DIPHENHYDRAMINE HCL 25 MG PO CAPS
25.0000 mg | ORAL_CAPSULE | Freq: Once | ORAL | Status: AC
  Administered 2016-12-27: 25 mg via ORAL
  Filled 2016-12-27: qty 1

## 2016-12-27 NOTE — Discharge Instructions (Signed)
Third Trimester of Pregnancy The third trimester is from week 28 through week 40 (months 7 through 9). The third trimester is a time when the unborn baby (fetus) is growing rapidly. At the end of the ninth month, the fetus is about 20 inches in length and weighs 6-10 pounds. Body changes during your third trimester Your body will continue to go through many changes during pregnancy. The changes vary from woman to woman. During the third trimester:  Your weight will continue to increase. You can expect to gain 25-35 pounds (11-16 kg) by the end of the pregnancy.  You may begin to get stretch marks on your hips, abdomen, and breasts.  You may urinate more often because the fetus is moving lower into your pelvis and pressing on your bladder.  You may develop or continue to have heartburn. This is caused by increased hormones that slow down muscles in the digestive tract.  You may develop or continue to have constipation because increased hormones slow digestion and cause the muscles that push waste through your intestines to relax.  You may develop hemorrhoids. These are swollen veins (varicose veins) in the rectum that can itch or be painful.  You may develop swollen, bulging veins (varicose veins) in your legs.  You may have increased body aches in the pelvis, back, or thighs. This is due to weight gain and increased hormones that are relaxing your joints.  You may have changes in your hair. These can include thickening of your hair, rapid growth, and changes in texture. Some women also have hair loss during or after pregnancy, or hair that feels dry or thin. Your hair will most likely return to normal after your baby is born.  Your breasts will continue to grow and they will continue to become tender. A yellow fluid (colostrum) may leak from your breasts. This is the first milk you are producing for your baby.  Your belly button may stick out.  You may notice more swelling in your hands,  face, or ankles.  You may have increased tingling or numbness in your hands, arms, and legs. The skin on your belly may also feel numb.  You may feel short of breath because of your expanding uterus.  You may have more problems sleeping. This can be caused by the size of your belly, increased need to urinate, and an increase in your body's metabolism.  You may notice the fetus "dropping," or moving lower in your abdomen (lightening).  You may have increased vaginal discharge.  You may notice your joints feel loose and you may have pain around your pelvic bone.  What to expect at prenatal visits You will have prenatal exams every 2 weeks until week 36. Then you will have weekly prenatal exams. During a routine prenatal visit:  You will be weighed to make sure you and the baby are growing normally.  Your blood pressure will be taken.  Your abdomen will be measured to track your baby's growth.  The fetal heartbeat will be listened to.  Any test results from the previous visit will be discussed.  You may have a cervical check near your due date to see if your cervix has softened or thinned (effaced).  You will be tested for Group B streptococcus. This happens between 35 and 37 weeks.  Your health care provider may ask you:  What your birth plan is.  How you are feeling.  If you are feeling the baby move.  If you have had   any abnormal symptoms, such as leaking fluid, bleeding, severe headaches, or abdominal cramping.  If you are using any tobacco products, including cigarettes, chewing tobacco, and electronic cigarettes.  If you have any questions.  Other tests or screenings that may be performed during your third trimester include:  Blood tests that check for low iron levels (anemia).  Fetal testing to check the health, activity level, and growth of the fetus. Testing is done if you have certain medical conditions or if there are problems during the  pregnancy.  Nonstress test (NST). This test checks the health of your baby to make sure there are no signs of problems, such as the baby not getting enough oxygen. During this test, a belt is placed around your belly. The baby is made to move, and its heart rate is monitored during movement.  What is false labor? False labor is a condition in which you feel small, irregular tightenings of the muscles in the womb (contractions) that usually go away with rest, changing position, or drinking water. These are called Braxton Hicks contractions. Contractions may last for hours, days, or even weeks before true labor sets in. If contractions come at regular intervals, become more frequent, increase in intensity, or become painful, you should see your health care provider. What are the signs of labor?  Abdominal cramps.  Regular contractions that start at 10 minutes apart and become stronger and more frequent with time.  Contractions that start on the top of the uterus and spread down to the lower abdomen and back.  Increased pelvic pressure and dull back pain.  A watery or bloody mucus discharge that comes from the vagina.  Leaking of amniotic fluid. This is also known as your "water breaking." It could be a slow trickle or a gush. Let your health care provider know if it has a color or strange odor. If you have any of these signs, call your health care provider right away, even if it is before your due date. Follow these instructions at home: Medicines  Follow your health care provider's instructions regarding medicine use. Specific medicines may be either safe or unsafe to take during pregnancy.  Take a prenatal vitamin that contains at least 600 micrograms (mcg) of folic acid.  If you develop constipation, try taking a stool softener if your health care provider approves. Eating and drinking  Eat a balanced diet that includes fresh fruits and vegetables, whole grains, good sources of protein  such as meat, eggs, or tofu, and low-fat dairy. Your health care provider will help you determine the amount of weight gain that is right for you.  Avoid raw meat and uncooked cheese. These carry germs that can cause birth defects in the baby.  If you have low calcium intake from food, talk to your health care provider about whether you should take a daily calcium supplement.  Eat four or five small meals rather than three large meals a day.  Limit foods that are high in fat and processed sugars, such as fried and sweet foods.  To prevent constipation: ? Drink enough fluid to keep your urine clear or pale yellow. ? Eat foods that are high in fiber, such as fresh fruits and vegetables, whole grains, and beans. Activity  Exercise only as directed by your health care provider. Most women can continue their usual exercise routine during pregnancy. Try to exercise for 30 minutes at least 5 days a week. Stop exercising if you experience uterine contractions.  Avoid heavy   lifting.  Do not exercise in extreme heat or humidity, or at high altitudes.  Wear low-heel, comfortable shoes.  Practice good posture.  You may continue to have sex unless your health care provider tells you otherwise. Relieving pain and discomfort  Take frequent breaks and rest with your legs elevated if you have leg cramps or low back pain.  Take warm sitz baths to soothe any pain or discomfort caused by hemorrhoids. Use hemorrhoid cream if your health care provider approves.  Wear a good support bra to prevent discomfort from breast tenderness.  If you develop varicose veins: ? Wear support pantyhose or compression stockings as told by your healthcare provider. ? Elevate your feet for 15 minutes, 3-4 times a day. Prenatal care  Write down your questions. Take them to your prenatal visits.  Keep all your prenatal visits as told by your health care provider. This is important. Safety  Wear your seat belt at  all times when driving.  Make a list of emergency phone numbers, including numbers for family, friends, the hospital, and police and fire departments. General instructions  Avoid cat litter boxes and soil used by cats. These carry germs that can cause birth defects in the baby. If you have a cat, ask someone to clean the litter box for you.  Do not travel far distances unless it is absolutely necessary and only with the approval of your health care provider.  Do not use hot tubs, steam rooms, or saunas.  Do not drink alcohol.  Do not use any products that contain nicotine or tobacco, such as cigarettes and e-cigarettes. If you need help quitting, ask your health care provider.  Do not use any medicinal herbs or unprescribed drugs. These chemicals affect the formation and growth of the baby.  Do not douche or use tampons or scented sanitary pads.  Do not cross your legs for long periods of time.  To prepare for the arrival of your baby: ? Take prenatal classes to understand, practice, and ask questions about labor and delivery. ? Make a trial run to the hospital. ? Visit the hospital and tour the maternity area. ? Arrange for maternity or paternity leave through employers. ? Arrange for family and friends to take care of pets while you are in the hospital. ? Purchase a rear-facing car seat and make sure you know how to install it in your car. ? Pack your hospital bag. ? Prepare the baby's nursery. Make sure to remove all pillows and stuffed animals from the baby's crib to prevent suffocation.  Visit your dentist if you have not gone during your pregnancy. Use a soft toothbrush to brush your teeth and be gentle when you floss. Contact a health care provider if:  You are unsure if you are in labor or if your water has broken.  You become dizzy.  You have mild pelvic cramps, pelvic pressure, or nagging pain in your abdominal area.  You have lower back pain.  You have persistent  nausea, vomiting, or diarrhea.  You have an unusual or bad smelling vaginal discharge.  You have pain when you urinate. Get help right away if:  Your water breaks before 37 weeks.  You have regular contractions less than 5 minutes apart before 37 weeks.  You have a fever.  You are leaking fluid from your vagina.  You have spotting or bleeding from your vagina.  You have severe abdominal pain or cramping.  You have rapid weight loss or weight gain.    You have shortness of breath with chest pain.  You notice sudden or extreme swelling of your face, hands, ankles, feet, or legs.  Your baby makes fewer than 10 movements in 2 hours.  You have severe headaches that do not go away when you take medicine.  You have vision changes. Summary  The third trimester is from week 28 through week 40, months 7 through 9. The third trimester is a time when the unborn baby (fetus) is growing rapidly.  During the third trimester, your discomfort may increase as you and your baby continue to gain weight. You may have abdominal, leg, and back pain, sleeping problems, and an increased need to urinate.  During the third trimester your breasts will keep growing and they will continue to become tender. A yellow fluid (colostrum) may leak from your breasts. This is the first milk you are producing for your baby.  False labor is a condition in which you feel small, irregular tightenings of the muscles in the womb (contractions) that eventually go away. These are called Braxton Hicks contractions. Contractions may last for hours, days, or even weeks before true labor sets in.  Signs of labor can include: abdominal cramps; regular contractions that start at 10 minutes apart and become stronger and more frequent with time; watery or bloody mucus discharge that comes from the vagina; increased pelvic pressure and dull back pain; and leaking of amniotic fluid. This information is not intended to replace advice  given to you by your health care provider. Make sure you discuss any questions you have with your health care provider. Document Released: 08/24/2001 Document Revised: 02/05/2016 Document Reviewed: 10/31/2012 Elsevier Interactive Patient Education  2017 [employer]  

## 2016-12-27 NOTE — MAU Provider Note (Signed)
History     CSN: [id number]  Arrival date and time: 12/26/16 2314   First Provider Initiated Contact with Patient 12/27/16 0025      Chief Complaint  Patient presents with  . Contractions   Kristi Marshall is a 31 y.o. [phone] at [redacted]w[redacted]d who presents today with contractions. She has had contractions off and on since she was seen here last on 12/17/16. She was 2cm at that visit. She denies any VB or LOF. She reports normal fetal movement. She has not had any prenatal care with this appointment.    Pelvic Pain  The patient's primary symptoms include pelvic pain. The patient's pertinent negatives include no vaginal discharge. This is a new problem. The current episode started in the past 7 days. The problem occurs intermittently. The problem has been unchanged. The pain is moderate. The problem affects both sides. She is pregnant. Pertinent negatives include no chills, diarrhea, fever or vomiting. The vaginal discharge was normal. There has been no bleeding. Nothing aggravates the symptoms. She has tried nothing for the symptoms.    Past Medical History:  Diagnosis Date  . Anxiety   . Bronchitis   . Headache    migraines  . PIH (pregnancy induced hypertension)   . Pregnancy induced hypertension   . Pyelonephritis   . Seasonal allergies   . 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 abortions    . DILATION AND CURETTAGE OF UTERUS     ? retained POC     Family History  Problem Relation Age of Onset  . Hypertension Mother   . Diabetes Maternal Aunt   . Hypertension Maternal Grandmother   . Diabetes Maternal Grandmother   . Heart disease Maternal Grandmother   . Hypertension Maternal Grandfather   . Heart disease Maternal Grandfather   . Anesthesia problems Neg Hx   . Hypotension Neg Hx   . Malignant hyperthermia Neg Hx   . Pseudochol deficiency Neg Hx   . Other Neg Hx   . Hearing loss Neg Hx     Social  History  Substance Use Topics  . Smoking status: Former Smoker    Years: 7.00    Types: Cigarettes    Quit date: 05/24/2016  . Smokeless tobacco: Never Used     Comment: Second hand smoke  . Alcohol use No    Allergies:  Allergies  Allergen Reactions  . Bactrim [Sulfamethoxazole-Trimethoprim] Nausea And Vomiting and Other (See Comments)    high fevers.   . Ibuprofen Itching  . Toradol [Ketorolac Tromethamine] Itching    Prescriptions Prior to Admission  Medication Sig Dispense Refill Last Dose  . acetaminophen (TYLENOL) 500 MG tablet Take 500 mg by mouth every 6 (six) hours as needed for mild pain or headache.   12/26/2016 at Unknown time  . prenatal vitamin w/FE, FA (PRENATAL 1 + 1) 27-1 MG TABS tablet Take 1 tablet by mouth daily at 12 noon.   Past Week at Unknown time  . albuterol (PROVENTIL HFA;VENTOLIN HFA) 108 (90 Base) MCG/ACT inhaler Inhale 2 puffs into the lungs every 6 (six) hours as needed for wheezing or shortness of breath.   More than a month at Unknown time  . bismuth subsalicylate (PEPTO BISMOL) 262 MG/15ML suspension Take 30 mLs by mouth every 6 (six) hours as needed for indigestion.   More than a month at Unknown time  . Oxycodone HCl 10 MG TABS Take  by mouth.  0 More than a month at Unknown time    Review of Systems  Constitutional: Negative for chills and fever.  Gastrointestinal: Negative for diarrhea and vomiting.  Genitourinary: Positive for pelvic pain. Negative for vaginal bleeding and vaginal discharge.   Physical Exam   Blood pressure 127/66, pulse 91, temperature 98.4 F (36.9 C), temperature source Oral, resp. rate 18, last menstrual period 04/27/2016, SpO2 98 %, unknown if currently breastfeeding.  Physical Exam  Nursing note and vitals reviewed. Constitutional: She is oriented to person, place, and time. She appears well-developed and well-nourished. No distress.  HENT:  Head: Normocephalic.  Cardiovascular: Normal rate.   Respiratory: Effort  normal.  GI: Soft. There is no tenderness. There is no rebound.  Genitourinary:  Genitourinary Comments: GBS collected today Cervix: 2at ext os/closed at int os/thick   Neurological: She is alert and oriented to person, place, and time.  Skin: Skin is warm and dry.  Psychiatric: She has a normal mood and affect.   FHT: 135, moderate with 15x15 accels, no decels Toco: irregular UCs   Results for orders placed or performed during the hospital encounter of 12/26/16 (from the past 24 hour(s))  Urinalysis, Routine w reflex microscopic     Status: Abnormal   Collection Time: 12/26/16 11:20 PM  Result Value Ref Range   Color, Urine YELLOW YELLOW   APPearance CLEAR CLEAR   Specific Gravity, Urine 1.020 1.005 - 1.030   pH 7.0 5.0 - 8.0   Glucose, UA NEGATIVE NEGATIVE mg/dL   Hgb urine dipstick NEGATIVE NEGATIVE   Bilirubin Urine NEGATIVE NEGATIVE   Ketones, ur NEGATIVE NEGATIVE mg/dL   Protein, ur NEGATIVE NEGATIVE mg/dL   Nitrite NEGATIVE NEGATIVE   Leukocytes, UA NEGATIVE NEGATIVE   RBC / HPF 0-5 0 - 5 RBC/hpf   WBC, UA 0-5 0 - 5 WBC/hpf   Bacteria, UA NONE SEEN NONE SEEN   Squamous Epithelial / LPF 0-5 (A) NONE SEEN   Mucous PRESENT     MAU Course  Procedures  MDM Prenatal labs, GBS today No prenatal care at this time other than MAU visits.   Assessment and Plan   1. False labor before 37 completed weeks of gestation in third trimester   2. [redacted] weeks gestation of pregnancy    DC home Comfort measures reviewed  3rd Trimester precautions  GBS/Prenatal labs pending  PTL precautions  Fetal kick counts RX: none  Return to MAU as needed FU with OB as planned  Follow-up Information    [facility] Follow up.   Specialty:  Obstetrics and Gynecology Contact information: [address] [place] [address] [phone]           Tawnya Crook 12/27/2016, 12:31 AM

## 2016-12-28 LAB — CULTURE, OB URINE

## 2016-12-28 LAB — RPR: RPR Ser Ql: NONREACTIVE

## 2016-12-28 LAB — RUBELLA SCREEN: Rubella: 1.48 index (ref >0.99)

## 2016-12-28 LAB — HIV ANTIBODY (ROUTINE TESTING W REFLEX): HIV Screen 4th Generation wRfx: NONREACTIVE

## 2016-12-30 LAB — CULTURE, BETA STREP (GROUP B ONLY)

## 2017-01-14 ENCOUNTER — Inpatient Hospital Stay (HOSPITAL_COMMUNITY): Payer: Medicaid Other | Admitting: Anesthesiology

## 2017-01-14 ENCOUNTER — Inpatient Hospital Stay (HOSPITAL_COMMUNITY)
Admission: AD | Admit: 2017-01-14 | Discharge: 2017-01-16 | DRG: 775 | Disposition: A | Discharge status: Home / Self Care | Payer: Medicaid Other | Source: Ambulatory Visit | Attending: Obstetrics and Gynecology | Admitting: Obstetrics and Gynecology

## 2017-01-14 ENCOUNTER — Encounter (HOSPITAL_COMMUNITY): Payer: Self-pay | Admitting: *Deleted

## 2017-01-14 DIAGNOSIS — Z3A37 37 weeks gestation of pregnancy: Secondary | ICD-10-CM

## 2017-01-14 DIAGNOSIS — Z87891 Personal history of nicotine dependence: Secondary | ICD-10-CM | POA: Diagnosis not present

## 2017-01-14 DIAGNOSIS — Z8249 Family history of ischemic heart disease and other diseases of the circulatory system: Secondary | ICD-10-CM | POA: Diagnosis not present

## 2017-01-14 DIAGNOSIS — Z3493 Encounter for supervision of normal pregnancy, unspecified, third trimester: Secondary | ICD-10-CM | POA: Diagnosis present

## 2017-01-14 DIAGNOSIS — Z833 Family history of diabetes mellitus: Secondary | ICD-10-CM | POA: Diagnosis not present

## 2017-01-14 LAB — CBC
HCT: 29.4 % — ABNORMAL LOW (ref 36.0–46.0)
HEMOGLOBIN: 9.5 g/dL — AB (ref 12.0–15.0)
MCH: 28.4 pg (ref 26.0–34.0)
MCHC: 32.3 g/dL (ref 30.0–36.0)
MCV: 87.8 fL (ref 78.0–100.0)
Platelets: 322 10*3/uL (ref 150–400)
RBC: 3.35 MIL/uL — AB (ref 3.87–5.11)
RDW: 13.9 % (ref 11.5–15.5)
WBC: 10.7 10*3/uL — ABNORMAL HIGH (ref 4.0–10.5)

## 2017-01-14 LAB — TYPE AND SCREEN
ABO/RH(D): AB POS
Antibody Screen: NEGATIVE

## 2017-01-14 MED ORDER — IBUPROFEN 600 MG PO TABS: 600.0000 mg | ORAL_TABLET | Freq: Four times a day (QID) | ORAL | Status: DC

## 2017-01-14 MED ORDER — EPHEDRINE 5 MG/ML INJ
10.0000 mg | INTRAVENOUS | Status: DC | PRN
  Filled 2017-01-14: qty 2

## 2017-01-14 MED ORDER — ZOLPIDEM TARTRATE 5 MG PO TABS: 5.0000 mg | ORAL_TABLET | Freq: Every evening | ORAL | Status: DC | PRN

## 2017-01-14 MED ORDER — LACTATED RINGERS IV SOLN
INTRAVENOUS | Status: DC
  Administered 2017-01-14: 12:00:00 via INTRAVENOUS

## 2017-01-14 MED ORDER — OXYTOCIN 40 UNITS IN LACTATED RINGERS INFUSION - SIMPLE MED
2.5000 [IU]/h | INTRAVENOUS | Status: DC
  Administered 2017-01-14: 2.5 [IU]/h via INTRAVENOUS
  Filled 2017-01-14: qty 1000

## 2017-01-14 MED ORDER — PRENATAL MULTIVITAMIN CH
1.0000 | ORAL_TABLET | Freq: Every day | ORAL | Status: DC
  Administered 2017-01-15: 1 via ORAL
  Filled 2017-01-14: qty 1

## 2017-01-14 MED ORDER — WITCH HAZEL-GLYCERIN EX PADS: 1.0000 "application " | MEDICATED_PAD | CUTANEOUS | Status: DC | PRN

## 2017-01-14 MED ORDER — SIMETHICONE 80 MG PO CHEW: 80.0000 mg | CHEWABLE_TABLET | ORAL | Status: DC | PRN

## 2017-01-14 MED ORDER — SENNOSIDES-DOCUSATE SODIUM 8.6-50 MG PO TABS
2.0000 | ORAL_TABLET | ORAL | Status: DC
  Administered 2017-01-14 – 2017-01-15 (×2): 2 via ORAL
  Filled 2017-01-14 (×2): qty 2

## 2017-01-14 MED ORDER — ONDANSETRON HCL 4 MG PO TABS: 4.0000 mg | ORAL_TABLET | ORAL | Status: DC | PRN

## 2017-01-14 MED ORDER — TETANUS-DIPHTH-ACELL PERTUSSIS 5-2.5-18.5 LF-MCG/0.5 IM SUSP: 0.5000 mL | Freq: Once | INTRAMUSCULAR | Status: DC

## 2017-01-14 MED ORDER — PHENYLEPHRINE 40 MCG/ML (10ML) SYRINGE FOR IV PUSH (FOR BLOOD PRESSURE SUPPORT)
80.0000 ug | PREFILLED_SYRINGE | INTRAVENOUS | Status: DC | PRN
  Filled 2017-01-14: qty 5

## 2017-01-14 MED ORDER — ONDANSETRON HCL 4 MG/2ML IJ SOLN
4.0000 mg | INTRAMUSCULAR | Status: DC | PRN
Start: 2017-01-14 — End: 2017-01-16

## 2017-01-14 MED ORDER — COCONUT OIL OIL: 1.0000 "application " | TOPICAL_OIL | Status: DC | PRN

## 2017-01-14 MED ORDER — OXYCODONE-ACETAMINOPHEN 5-325 MG PO TABS: 1.0000 | ORAL_TABLET | ORAL | Status: DC | PRN

## 2017-01-14 MED ORDER — LIDOCAINE HCL (PF) 1 % IJ SOLN
INTRAMUSCULAR | Status: DC | PRN
  Administered 2017-01-14 (×2): 5 mL

## 2017-01-14 MED ORDER — OXYTOCIN BOLUS FROM INFUSION
500.0000 mL | Freq: Once | INTRAVENOUS | Status: AC
  Administered 2017-01-14: 500 mL via INTRAVENOUS

## 2017-01-14 MED ORDER — PHENYLEPHRINE 40 MCG/ML (10ML) SYRINGE FOR IV PUSH (FOR BLOOD PRESSURE SUPPORT)
80.0000 ug | PREFILLED_SYRINGE | INTRAVENOUS | Status: DC | PRN
  Filled 2017-01-14: qty 5
  Filled 2017-01-14: qty 10

## 2017-01-14 MED ORDER — LIDOCAINE HCL (PF) 1 % IJ SOLN
30.0000 mL | INTRAMUSCULAR | Status: DC | PRN
Start: 2017-01-14 — End: 2017-01-14
  Filled 2017-01-14: qty 30

## 2017-01-14 MED ORDER — ONDANSETRON HCL 4 MG/2ML IJ SOLN: 4.0000 mg | Freq: Four times a day (QID) | INTRAMUSCULAR | Status: DC | PRN

## 2017-01-14 MED ORDER — ACETAMINOPHEN 325 MG PO TABS
650.0000 mg | ORAL_TABLET | ORAL | Status: DC | PRN
  Administered 2017-01-14 – 2017-01-15 (×3): 650 mg via ORAL
  Filled 2017-01-14 (×3): qty 2

## 2017-01-14 MED ORDER — DIPHENHYDRAMINE HCL 50 MG/ML IJ SOLN: 12.5000 mg | INTRAMUSCULAR | Status: DC | PRN

## 2017-01-14 MED ORDER — ACETAMINOPHEN 325 MG PO TABS: 650.0000 mg | ORAL_TABLET | ORAL | Status: DC | PRN

## 2017-01-14 MED ORDER — BENZOCAINE-MENTHOL 20-0.5 % EX AERO: 1.0000 "application " | INHALATION_SPRAY | CUTANEOUS | Status: DC | PRN

## 2017-01-14 MED ORDER — SOD CITRATE-CITRIC ACID 500-334 MG/5ML PO SOLN: 30.0000 mL | ORAL | Status: DC | PRN

## 2017-01-14 MED ORDER — DIBUCAINE 1 % RE OINT: 1.0000 "application " | TOPICAL_OINTMENT | RECTAL | Status: DC | PRN

## 2017-01-14 MED ORDER — LACTATED RINGERS IV SOLN: 500.0000 mL | Freq: Once | INTRAVENOUS | Status: DC

## 2017-01-14 MED ORDER — DIPHENHYDRAMINE HCL 25 MG PO CAPS: 25.0000 mg | ORAL_CAPSULE | Freq: Four times a day (QID) | ORAL | Status: DC | PRN

## 2017-01-14 MED ORDER — OXYCODONE HCL 5 MG PO TABS
5.0000 mg | ORAL_TABLET | Freq: Four times a day (QID) | ORAL | Status: DC | PRN
  Administered 2017-01-14 – 2017-01-16 (×7): 5 mg via ORAL
  Filled 2017-01-14 (×7): qty 1

## 2017-01-14 MED ORDER — FENTANYL CITRATE (PF) 100 MCG/2ML IJ SOLN: 100.0000 ug | INTRAMUSCULAR | Status: DC | PRN

## 2017-01-14 MED ORDER — LACTATED RINGERS IV SOLN: 500.0000 mL | INTRAVENOUS | Status: DC | PRN

## 2017-01-14 MED ORDER — OXYCODONE-ACETAMINOPHEN 5-325 MG PO TABS: 2.0000 | ORAL_TABLET | ORAL | Status: DC | PRN

## 2017-01-14 MED ORDER — FENTANYL 2.5 MCG/ML BUPIVACAINE 1/10 % EPIDURAL INFUSION (WH - ANES)
14.0000 mL/h | INTRAMUSCULAR | Status: DC | PRN
  Administered 2017-01-14: 14 mL/h via EPIDURAL
  Filled 2017-01-14: qty 100

## 2017-01-14 NOTE — MAU Note (Signed)
Called resident Dr. Sydnee Cabaliallo reported patient [id number] 3260w3d 6 cm, requested admission orders.

## 2017-01-14 NOTE — Anesthesia Procedure Notes (Signed)
Epidural Patient location during procedure: OB  Staffing Anesthesiologist: Angad Nabers Performed: anesthesiologist   Preanesthetic Checklist Completed: patient identified, site marked, surgical consent, pre-op evaluation, timeout performed, IV checked, risks and benefits discussed and monitors and equipment checked  Epidural Patient position: sitting Prep: DuraPrep Patient monitoring: heart rate, continuous pulse ox and blood pressure Approach: midline Location: L3-L4 Injection technique: LOR saline  Needle:  Needle type: Tuohy  Needle gauge: 17 G Needle length: 9 cm and 9 Needle insertion depth: 6 cm Catheter type: closed end flexible Catheter size: 20 Guage Catheter at skin depth: 10 cm Test dose: negative  Assessment Events: blood not aspirated, injection not painful, no injection resistance, negative IV test and no paresthesia  Additional Notes Patient identified. Risks/Benefits/Options discussed with patient including but not limited to bleeding, infection, nerve damage, paralysis, failed block, incomplete pain control, headache, blood pressure changes, nausea, vomiting, reactions to medication both or allergic, itching and postpartum back pain. Confirmed with bedside nurse the patient's most recent platelet count. Confirmed with patient that they are not currently taking any anticoagulation, have any bleeding history or any family history of bleeding disorders. Patient expressed understanding and wished to proceed. All questions were answered. Sterile technique was used throughout the entire procedure. Please see nursing notes for vital signs. Test dose was given through epidural needle and negative prior to continuing to dose epidural or start infusion. Warning signs of high block given to the patient including shortness of breath, tingling/numbness in hands, complete motor block, or any concerning symptoms with instructions to call for help. Patient was given instructions  on fall risk and not to get out of bed. All questions and concerns addressed with instructions to call with any issues.     

## 2017-01-14 NOTE — Anesthesia Preprocedure Evaluation (Signed)
Anesthesia Evaluation  Patient identified by MRN, date of birth, ID band Patient awake    Reviewed: Allergy & Precautions, NPO status , Patient's Chart, lab work & pertinent test results  History of Anesthesia Complications Negative for: history of anesthetic complications  Airway Mallampati: II  TM Distance: >3 FB Neck ROM: Full    Dental no notable dental hx. (+) Dental Advisory Given   Pulmonary Current Smoker, former smoker,    Pulmonary exam normal breath sounds clear to auscultation       Cardiovascular hypertension, Pt. on medications Normal cardiovascular exam Rhythm:Regular Rate:Normal     Neuro/Psych  Headaches, Seizures - (remote sz hx, none in >3 years),  PSYCHIATRIC DISORDERS Anxiety    GI/Hepatic negative GI ROS, Neg liver ROS, (+)     substance abuse (Reports no history of substance abuse, positive history of cocaine on UDS and benzos and opiates)  ,   Endo/Other  negative endocrine ROS  Renal/GU negative Renal ROS  negative genitourinary   Musculoskeletal negative musculoskeletal ROS (+)   Abdominal   Peds negative pediatric ROS (+)  Hematology negative hematology ROS (+)   Anesthesia Other Findings   Reproductive/Obstetrics negative OB ROS                             Anesthesia Physical  Anesthesia Plan  ASA: III  Anesthesia Plan: Epidural   Post-op Pain Management:    Induction:   Airway Management Planned:   Additional Equipment:   Intra-op Plan:   Post-operative Plan:   Informed Consent: I have reviewed the patients History and Physical, chart, labs and discussed the procedure including the risks, benefits and alternatives for the proposed anesthesia with the patient or authorized representative who has indicated his/her understanding and acceptance.   Dental advisory given  Plan Discussed with:   Anesthesia Plan Comments:          Anesthesia Quick Evaluation

## 2017-01-14 NOTE — MAU Note (Signed)
Brought in by EMS for labor check; G9P5; 37week3d gestation;

## 2017-01-14 NOTE — H&P (Signed)
LABOR AND DELIVERY ADMISSION HISTORY AND PHYSICAL NOTE  Kristi Marshall is a 31 y.o. female [phone] with IUP at [redacted]w[redacted]d by LMP presenting for SOL.   Patient is considering a private adoption, but has not signed papers yet.   Patient did not have any prenatal care.   She reports positive fetal movement. She denies leakage of fluid or vaginal bleeding.  Prenatal History/Complications:  Past Medical History: Past Medical History:  Diagnosis Date  . Anxiety   . Bronchitis   . Headache    migraines  . PIH (pregnancy induced hypertension)   . Pregnancy induced hypertension   . Pyelonephritis   . Seasonal allergies   . 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 abortions    . DILATION AND CURETTAGE OF UTERUS     ? retained POC     Obstetrical History: OB History    Gravida Para Term Preterm AB Living   9 5 5  0 3 5   SAB TAB Ectopic Multiple Live Births   0 3 0 0 5      Social History: Social History   Social History  . Marital status: Single    Spouse name: N/A  . Number of children: N/A  . Years of education: N/A   Social History Main Topics  . Smoking status: Former Smoker    Years: 7.00    Types: Cigarettes    Quit date: 05/24/2016  . Smokeless tobacco: Former Neurosurgeon     Comment: Second hand smoke  . Alcohol use No  . Drug use: No  . Sexual activity: Yes     Comment: last intercourse over a month ago   Other Topics Concern  . None   Social History Narrative  . None    Family History: Family History  Problem Relation Age of Onset  . Hypertension Mother   . Diabetes Maternal Aunt   . Hypertension Maternal Grandmother   . Diabetes Maternal Grandmother   . Heart disease Maternal Grandmother   . Hypertension Maternal Grandfather   . Heart disease Maternal Grandfather   . Anesthesia problems Neg Hx   . Hypotension Neg Hx   . Malignant hyperthermia Neg Hx   .  Pseudochol deficiency Neg Hx   . Other Neg Hx   . Hearing loss Neg Hx     Allergies: Allergies  Allergen Reactions  . Bactrim [Sulfamethoxazole-Trimethoprim] Nausea And Vomiting and Other (See Comments)    high fevers.   . Ibuprofen Itching  . Toradol [Ketorolac Tromethamine] Itching    Prescriptions Prior to Admission  Medication Sig Dispense Refill Last Dose  . acetaminophen (TYLENOL) 500 MG tablet Take 500 mg by mouth every 6 (six) hours as needed for mild pain or headache.   12/26/2016 at Unknown time  . albuterol (PROVENTIL HFA;VENTOLIN HFA) 108 (90 Base) MCG/ACT inhaler Inhale 2 puffs into the lungs every 6 (six) hours as needed for wheezing or shortness of breath.   More than a month at Unknown time  . bismuth subsalicylate (PEPTO BISMOL) 262 MG/15ML suspension Take 30 mLs by mouth every 6 (six) hours as needed for indigestion.   More than a month at Unknown time  . Oxycodone HCl 10 MG TABS Take by mouth.  0 More than a month at Unknown time  . prenatal vitamin w/FE, FA (PRENATAL 1 + 1) 27-1 MG TABS tablet Take 1 tablet by mouth daily  at 12 noon.   Past Week at Unknown time     Review of Systems   All systems reviewed and negative except as stated in HPI  Last menstrual period 04/27/2016, unknown if currently breastfeeding. General appearance: alert, cooperative and appears stated age Lungs: Normal respiratory effort, no audible wheezing Heart: regular rate and pulses palpated bilaterally upper and lower extremities Abdomen: soft, non-tender; gravid abdomen appropriate for gestational age Extremities: No calf swelling or tenderness Presentation: cephalic by nurse exam Fetal monitoring: FHR 130 Uterine activity:  Dilation: 6 Effacement (%): 90 Station: -1 Exam by:: Morrison Oldee Carter RN   Prenatal labs: ABO, Rh:   AB pos Antibody:  neg Rubella: Immune  RPR: Non Reactive (04/16 0037)  HBsAg: Negative (04/16 0037)  HIV: Non Reactive (04/16 0037)  GBS:  Negative  Genetic  screening:  Not preformed Anatomy US: Normal  Prenatal Transfer Tool  Maternal Diabetes: No Genetic Screening: Declined Maternal Ultrasounds/Referrals: Normal Fetal Ultrasounds or other Referrals:  None Maternal Substance Abuse:  No Significant Maternal Medications:  None Significant Maternal Lab Results: Lab values include: Group B Strep negative   No results found for this or any previous visit (from the past 24 hour(s)).  Patient Active Problem List   Diagnosis Date Noted  . Vaginal delivery 01/09/2015  . Pyelonephritis affecting pregnancy in second trimester, antepartum 08/09/2014  . Pyelonephritis 03/22/2014  . Menorrhagia 07/31/2012  . Dysmenorrhea 07/31/2012  . Single episode of elevated blood pressure 07/31/2012  . Seizure-like activity (HCC) 06/20/2012  . THYROMEGALY 12/17/2009  . ALLERGIC RHINITIS 12/17/2009  . TOBACCO USER 05/24/2009  . SEIZURE DISORDER 12/20/2008    Assessment: Kristi Marshall is a 31 y.o. [phone] at 9844w3d here for SOL.    #Labor:Expectant management   #Pain: IV pain med and epidural upon request  #FWB: Category 1 #ID:  GBS negative #MOF: bottle  Ernestina PennaNicholas Schenk, MD 01/14/17 1:36 PM

## 2017-01-14 NOTE — Anesthesia Postprocedure Evaluation (Signed)
Anesthesia Post Note  Patient: Kristi Marshall  Procedure(s) Performed: * No procedures listed *  Patient location during evaluation: Mother Baby Anesthesia Type: Epidural Level of consciousness: awake and alert Pain management: pain level controlled Vital Signs Assessment: post-procedure vital signs reviewed and stable Respiratory status: spontaneous breathing, nonlabored ventilation and respiratory function stable Cardiovascular status: stable Postop Assessment: no headache, no backache and epidural receding Anesthetic complications: no        Last Vitals:  Vitals:   01/14/17 1331 01/14/17 1350  BP: 137/80 135/75  Pulse: 85 77  Resp: 20 18  Temp:  36.6 C    Last Pain:  Vitals:   01/14/17 1500  TempSrc:   PainSc: 7    Pain Goal: Patients Stated Pain Goal: 2 (01/14/17 1500)               Junious SilkGILBERT,Hayden Kihara

## 2017-01-14 NOTE — Progress Notes (Signed)
CSW met with MOB to inquire about MOB's plans for adoption. MOB communicated that MOB has been working with an adoption however, MOB could not recall adoption agency's name.  MOB did not bring any documents related to the agency and was unsure of adoption agency's contact name and number.  MOB requested additional time to process if MOB wants to proceed with adoption. MOB also communicated that FOB is the only person that is aware of MOB's consideration for adoption and provided CSW permission to meet with MOB if FOB is present.  CSW informed MOB that weekend CSW will follow-up with MOB on tomorrow (5/5).     Boyd-Gilyard, MSW, LCSW Clinical Social Work [phone]  

## 2017-01-15 LAB — RPR: RPR: NONREACTIVE

## 2017-01-15 MED ORDER — TIZANIDINE HCL 2 MG PO TABS
4.0000 mg | ORAL_TABLET | Freq: Three times a day (TID) | ORAL | Status: DC | PRN
  Administered 2017-01-15: 4 mg via ORAL
  Filled 2017-01-15 (×2): qty 2

## 2017-01-15 NOTE — Progress Notes (Signed)
Post Partum Day 1 Subjective: no complaints, up ad lib, voiding, tolerating PO and + flatus; wants BTL papers signed; complaining of rightsided low back pain  Objective: Blood pressure 132/90, pulse 68, temperature 98.1 F (36.7 C), temperature source Oral, resp. rate 18, height 5\' 4"  (1.626 m), weight 188 lb (85.3 kg), last menstrual period 04/27/2016, SpO2 99 %, unknown if currently breastfeeding.  Physical Exam:  General: alert, cooperative and no distress Lochia: appropriate Uterine Fundus: firm Incision: n/a DVT Evaluation: No evidence of DVT seen on physical exam. Negative Homan's sign. No cords or calf tenderness.   Recent Labs  01/14/17 1140  HGB 9.5*  HCT 29.4*    Assessment/Plan: Plan for discharge tomorrow  SW; was considering adoption BTL papers with clinic appt [facility] for back pain (pt not breast feeding).   LOS: 1 day   Kristi Marshall Allender 01/15/2017, 8:28 AM

## 2017-01-15 NOTE — Progress Notes (Signed)
CSW attempted to meet with MOB to offer support and complete [facility] and [facility], but MOB's 30 year old daughter was present at this time.  CSW would like to talk with MOB privately and MOB agrees.  CSW left phone number for MOB and requests a call as soon as she is alone.  MOB agrees.

## 2017-01-16 MED ORDER — DOCUSATE SODIUM 100 MG PO CAPS: 100.0000 mg | ORAL_CAPSULE | Freq: Two times a day (BID) | ORAL | 0 refills | Status: DC

## 2017-01-16 NOTE — Discharge Instructions (Signed)
Postpartum Tubal Ligation Postpartum tubal ligation (PPTL) is a procedure to close the fallopian tubes. This is done so that you cannot get pregnant. When the fallopian tubes are closed, the eggs that the ovaries release cannot enter the uterus, and sperm cannot reach the eggs. PPTL is done right after childbirth or 1-2 days after childbirth, before the uterus returns to its normal location. PPTL is sometimes called "getting your tubes tied." You should not have this procedure if you want to get pregnant someday or if you are unsure about having more children. Tell a health care provider about:  Any allergies you have.  All medicines you are taking, including vitamins, herbs, eye drops, creams, and over-the-counter medicines.  Previous problems you or members of your family have had with the use of anesthetics.  Any blood disorders you have.  Previous surgeries you have had.  Any medical conditions you may have.  Any past pregnancies. What are the risks? Generally, this is a safe procedure. However, problems may occur, including:  Infection.  Bleeding.  Injury to surrounding organs.  Side effects from anesthetics.  Failure of the procedure. This procedure can increase your risk of a kind of pregnancy in which a fertilized egg attaches to the outside of the uterus (ectopic pregnancy). What happens before the procedure?  Ask your health care provider about:  How much pain you can expect to have.  What medicines you will be given for pain, especially if you are planning to breastfeed.  Follow instructions from your health care provider about eating and drinking restrictions. What happens during the procedure? If you had a vaginal delivery:  You may be given one or more of the following:  A medicine that helps you relax (sedative).  A medicine to numb the area (local anesthetic).  A medicine to make you fall asleep (general anesthetic).  A medicine that is injected into  an area of your body to numb everything below the injection site (regional anesthetic).  If you have been given a general anesthetic, a tube will be put down your throat to help you breathe.  An IV tube will be inserted into one of your veins to give you medicines and fluids during the procedure.  Your bladder may be emptied with a small tube (catheter).  An incision will be made just below your belly button.  Your fallopian tubes will be located and brought up through the incision.  Your fallopian tubes will be tied off, burned (cauterized), or blocked with a clip, ring, or clamp. A small portion in the center of each fallopian tube may be removed.  The incision will be closed with stitches (sutures).  A bandage (dressing) will be placed over the incision. If you had a cesarean delivery:  Tubal ligation will be done through the incision that was used for the cesarean delivery of your baby.  The incision will be closed with sutures.  A dressing will be placed over the incision. The procedure may vary among health care providers and hospitals. What happens after the procedure?  Your blood pressure, heart rate, breathing rate, and blood oxygen level will be monitored often until the medicines you were given have worn off.  You will be given pain medicine as needed.  Do not drive for 24 hours if you received a sedative. This information is not intended to replace advice given to you by your health care provider. Make sure you discuss any questions you have with your health care provider. Document  Released: 08/30/2005 Document Revised: 02/02/2016 Document Reviewed: 08/10/2015 Elsevier Interactive Patient Education  2017 [employer] Inc[employer]   Vaginal Delivery, Care After Refer to this sheet in the next few weeks. These instructions provide you with information about caring for yourself after vaginal delivery. Your health care provider may also give you more specific instructions. Your  treatment has been planned according to current medical practices, but problems sometimes occur. Call your health care provider if you have any problems or questions. What can I expect after the procedure? After vaginal delivery, it is common to have:  Some bleeding from your vagina.  Soreness in your abdomen, your vagina, and the area of skin between your vaginal opening and your anus (perineum).  Pelvic cramps.  Fatigue. Follow these instructions at home: Medicines   Take over-the-counter and prescription medicines only as told by your health care provider.  If you were prescribed an antibiotic medicine, take it as told by your health care provider. Do not stop taking the antibiotic until it is finished. Driving    Do not drive or operate heavy machinery while taking prescription pain medicine.  Do not drive for 24 hours if you received a sedative. Lifestyle   Do not drink alcohol. This is especially important if you are breastfeeding or taking medicine to relieve pain.  Do not use tobacco products, including cigarettes, chewing tobacco, or e-cigarettes. If you need help quitting, ask your health care provider. Eating and drinking   Drink at least 8 eight-ounce glasses of water every day unless you are told not to by your health care provider. If you choose to breastfeed your baby, you may need to drink more water than this.  Eat high-fiber foods every day. These foods may help prevent or relieve constipation. High-fiber foods include:  Whole grain cereals and breads.  Brown rice.  Beans.  Fresh fruits and vegetables. Activity   Return to your normal activities as told by your health care provider. Ask your health care provider what activities are safe for you.  Rest as much as possible. Try to rest or take a nap when your baby is sleeping.  Do not lift anything that is heavier than your baby or 10 lb (4.5 kg) until your health care provider says that it is  safe.  Talk with your health care provider about when you can engage in sexual activity. This may depend on your:  Risk of infection.  Rate of healing.  Comfort and desire to engage in sexual activity. Vaginal Care   If you have an episiotomy or a vaginal tear, check the area every day for signs of infection. Check for:  More redness, swelling, or pain.  More fluid or blood.  Warmth.  Pus or a bad smell.  Do not use tampons or douches until your health care provider says this is safe.  Watch for any blood clots that may pass from your vagina. These may look like clumps of dark red, brown, or black discharge. General instructions   Keep your perineum clean and dry as told by your health care provider.  Wear loose, comfortable clothing.  Wipe from front to back when you use the toilet.  Ask your health care provider if you can shower or take a bath. If you had an episiotomy or a perineal tear during labor and delivery, your health care provider may tell you not to take baths for a certain length of time.  Wear a bra that supports your breasts and fits  you well.  If possible, have someone help you with household activities and help care for your baby for at least a few days after you leave the hospital.  Keep all follow-up visits for you and your baby as told by your health care provider. This is important. Contact a health care provider if:  You have:  Vaginal discharge that has a bad smell.  Difficulty urinating.  Pain when urinating.  A sudden increase or decrease in the frequency of your bowel movements.  More redness, swelling, or pain around your episiotomy or vaginal tear.  More fluid or blood coming from your episiotomy or vaginal tear.  Pus or a bad smell coming from your episiotomy or vaginal tear.  A fever.  A rash.  Little or no interest in activities you used to enjoy.  Questions about caring for yourself or your baby.  Your episiotomy or  vaginal tear feels warm to the touch.  Your episiotomy or vaginal tear is separating or does not appear to be healing.  Your breasts are painful, hard, or turn red.  You feel unusually sad or worried.  You feel nauseous or you vomit.  You pass large blood clots from your vagina. If you pass a blood clot from your vagina, save it to show to your health care provider. Do not flush blood clots down the toilet without having your health care provider look at them.  You urinate more than usual.  You are dizzy or light-headed.  You have not breastfed at all and you have not had a menstrual period for 12 weeks after delivery.  You have stopped breastfeeding and you have not had a menstrual period for 12 weeks after you stopped breastfeeding. Get help right away if:  You have:  Pain that does not go away or does not get better with medicine.  Chest pain.  Difficulty breathing.  Blurred vision or spots in your vision.  Thoughts about hurting yourself or your baby.  You develop pain in your abdomen or in one of your legs.  You develop a severe headache.  You faint.  You bleed from your vagina so much that you fill two sanitary pads in one hour. This information is not intended to replace advice given to you by your health care provider. Make sure you discuss any questions you have with your health care provider. Document Released: 08/27/2000 Document Revised: 02/11/2016 Document Reviewed: 09/14/2015 Elsevier Interactive Patient Education  2017 [employer].

## 2017-01-16 NOTE — Discharge Summary (Signed)
OB Discharge Summary  Patient Name: Kristi Marshall DOB: August 31, 1986 MRN: [id number]  Date of admission: 01/14/2017 Delivering MD: Lorne Skeens   Date of discharge: 01/16/2017  Admitting diagnosis: 39WKS,CTX Intrauterine pregnancy: [redacted]w[redacted]d     Secondary diagnosis:Active Problems:   Normal labor  Additional problems:none     Discharge diagnosis: Term Pregnancy Delivered                                                                     Post partum procedures:n/a  Augmentation: n/a  Complications: None  Hospital course:  Onset of Labor With Vaginal Delivery     31 y.o. yo [id number] at [redacted]w[redacted]d was admitted in Active Labor on 01/14/2017. Patient had an uncomplicated labor course as follows:  Membrane Rupture Time/Date: 12:23 PM ,01/14/2017   Intrapartum Procedures: Episiotomy: None [1]                                         Lacerations:  Periurethral [8]  Patient had a delivery of a Viable infant. 01/14/2017  Information for the patient's newborn:  Nusaiba, Guallpa [[id number]]  Delivery Method: Vag-Spont    Pateint had an uncomplicated postpartum course.  She is ambulating, tolerating a regular diet, passing flatus, and urinating well. Patient is discharged home in stable condition on 01/16/17.   Physical exam  Vitals:   01/15/17 0315 01/15/17 0700 01/15/17 1826 01/16/17 0557  BP: 121/66 132/90 135/84 (!) 153/95  Pulse: 71 68 72 70  Resp: 18 18 18 20   Temp: 98.3 F (36.8 C) 98.1 F (36.7 C) 98.4 F (36.9 C) 97.9 F (36.6 C)  TempSrc: Oral Oral  Oral  SpO2:      Weight:      Height:       General: alert, cooperative and no distress Lochia: appropriate Uterine Fundus: firm Incision: N/A DVT Evaluation: No evidence of DVT seen on physical exam. Labs: Lab Results  Component Value Date   WBC 10.7 (H) 01/14/2017   HGB 9.5 (L) 01/14/2017   HCT 29.4 (L) 01/14/2017   MCV 87.8 01/14/2017   PLT 322 01/14/2017   CMP Latest Ref Rng & Units 02/22/2016   Glucose 65 - 99 mg/dL 94  BUN 6 - 20 mg/dL 10  Creatinine 1.30 - 8.65 mg/dL 7.84  Sodium 696 - 295 mmol/L 142  Potassium 3.5 - 5.1 mmol/L 3.6  Chloride 101 - 111 mmol/L 109  CO2 22 - 32 mmol/L 29  Calcium 8.9 - 10.3 mg/dL 2.8(U)  Total Protein 6.0 - 8.3 g/dL -  Total Bilirubin 0.3 - 1.2 mg/dL -  Alkaline Phos 39 - 132 U/L -  AST 0 - 37 U/L -  ALT 0 - 35 U/L -    Discharge instruction: per After Visit Summary and "Baby and Me Booklet".  After Visit Meds:  Allergies as of 01/16/2017      Reactions   Bactrim [sulfamethoxazole-trimethoprim] Nausea And Vomiting, Other (See Comments)   high fevers.    Ibuprofen Itching   Toradol [ketorolac Tromethamine] Itching      Medication List    TAKE these medications  acetaminophen 500 MG tablet Commonly known as:  TYLENOL Take 500 mg by mouth every 6 (six) hours as needed for mild pain or headache.   albuterol 108 (90 Base) MCG/ACT inhaler Commonly known as:  PROVENTIL HFA;VENTOLIN HFA Inhale 2 puffs into the lungs every 6 (six) hours as needed for wheezing or shortness of breath.   bismuth subsalicylate 262 MG/15ML suspension Commonly known as:  PEPTO BISMOL Take 30 mLs by mouth every 6 (six) hours as needed for indigestion.   docusate sodium 100 MG capsule Commonly known as:  COLACE Take 1 capsule (100 mg total) by mouth 2 (two) times daily.   Oxycodone HCl 10 MG Tabs Take 10 mg by mouth 2 (two) times daily as needed (pain).   prenatal vitamin w/FE, FA 27-1 MG Tabs tablet Take 1 tablet by mouth daily at 12 noon.       Diet: routine diet  Activity: Advance as tolerated. Pelvic rest for 6 weeks.   Outpatient follow up:2 weeks Follow up Appt:No future appointments. Follow up visit: No Follow-up on file.  Postpartum contraception: interval BTL  Newborn Data: Live born female  Birth Weight: 7 lb 0.7 oz (3195 g) APGAR: 8, 9  Baby Feeding: Breast Disposition:home with mother   01/16/2017 Wyvonnia DuskyMarie Marguriete Wootan, CNM

## 2017-01-17 NOTE — Progress Notes (Signed)
CLINICAL SOCIAL WORK MATERNAL/CHILD NOTE  Patient Details  Name: Kristi Marshall MRN: [id number] Date of Birth: 2016-04-24  Date:  2015/12/31  Clinical Social Worker Initiating Note:  Terri Piedra, [place] Date/ Time Initiated:  01/15/17/1500     Child's Name:  Kristi Marshall   Legal Guardian:  Mother Kristi Marshall.  FOB is Ayrianna Mcginniss, but is not on the birth certificate.)   Need for Interpreter:  None   Date of Referral:  05-17-2017     Reason for Referral:  Adoption , Late or No Prenatal Care    Referral Source:  [facility]   Address:  [address] [place], [place] [address]  Phone number:  [phone] , cell: [phone]  Household Members:  Minor Children, Other (Comment) (MOB lives with FOB and their 68 year old daughter Hiliary Osorto (01/09/15))   Natural Supports (not living in the home):  Friends, Immediate Family, Extended Family (MOB reports that she has a great support system of family and friends.)   Professional Supports: None (MOB is interested in outpatient counseling and receptive of resources provided by CSW.)   Employment:     Type of Work: MOB works at [employer] and plans to return after maternity leave.   Education:      Pensions consultant:  [employer]   Other Resources:   (MOB has a friend who works for [employer] who will get her an appointment quickly.) MOB receives Physicist, medical.  Cultural/Religious Considerations Which May Impact Care: None stated.  MOB's facesheet notes religion as Non-Denominational.  Strengths:  Ability to meet basic needs , Home prepared for child  [facility])   Risk Factors/Current Problems:  Mental Health Concerns    Cognitive State:  Able to Concentrate , Alert , Insightful , Goal Oriented , Linear Thinking    Mood/Affect:  Interested , Depressed , Calm , Comfortable    CSW Assessment: CSW initially met with MOB on 2017-07-17 in the afternoon to offer support and complete assessment due to  [facility] and contemplating adoption.  CSW returned in the morning or Apr 10, 2017 to complete assessment given need for MOB to continue processing her feelings and make a decision regarding her plans for infant.  MOB was receptive to CSW's visit, however her two daughters and cousin were present with her and CSW had hoped to speak with her privately, as discussed on first attempt to meet with MOB.  MOB stated we could speak openly as she did not feel her visitors were paying any attention, but CSW feels this hindered the conversation, further requiring follow up. MOB states she had made an appointment to get an implant for birth control when she found out she was pregnant.  She states she initially thought she would terminate the pregnancy, but decided she did not want to go through this again emotionally or physically.  She states she began thinking about adoption and contacted an agency.  She had plans to place baby for adoption until she delivered, saw and held baby.  She told CSW that her main reason for not wanting to parent is feeling that she is not self-sufficient.  She reports that she has been on her own in her own housing since she was 31 years old.  She recently lost her apartment for not following rules regarding maintenance, renovations, and inspections.  She states she was not home when she was instructed to be in order for those things to take place and that she missed "meetings"  she was supposed to attend at her housing complex.  She states although she was devastated to lose her own housing, she was also somewhat relieved as she lived at [facility] and states it was not a nice place to live.  MOB states she was forced to make arrangements for her 4 older children to stay with relatives because she and her youngest daughter moved in with her youngest daughter's father/Augustus Amason.  MOB states that she and Mr. Oboyle are not in a relationship, but that he is a good support and that they get along  well.  She reports that her oldest two daughters, Fayrene Fearing (29) and Kristine Garbe (12) are staying with their father's mother.  MOB states her daughter Marilynn Latino (50) is with her aunt and her son Aaron Edelman (1) is with his father in [place].  MOB's 78 and 68 year old were present with her and appear happy, healthy, and well cared for.  MOB states that she and Trinity are safe and comfortable living with FOB, but that she is not content with having to rely on him.  She also did not want to bring another baby into the home as she is trying to get back on her feet.  She states she works and has a great support system who will support her in parenting this baby if she chooses to do so.  She had baby clothes in a bag on her bed and she states she has a bassinet at home.   After much discussion, MOB still appeared torn about her decision.  CSW suggested that she take some time without baby in her arms to think and process her decision.  MOB was appreciative of this and CSW took baby to the nursery.  CSW arranged to meet with MOB again on 2017-01-13 to follow up. CSW met with MOB who presents again as depressed, but easy to engage and pleasant.  MOB reports that she is tired, but feels she is prepared to deal with sleep deprivation and all that comes along with caring for a newborn.  She, however, feels she needs to leave the hospital today to get things prepared and in order to visit with her son/talk to her son's father who will be arriving from [place] today for a visit.  CSW explained that baby can become a baby patient after MOB's discharge today if she has someone who can trade off with her in caring for the infant in her hospital room.  CSW told MOB that baby can become a nursery patient if she does not have anyone available to assist her today.  She would like baby to be a nursery patient and states she will remain involved and check on infant throughout the rest of baby's hospitalization.   CSW inquired about hx of CPS  involvement documented in MOB's chart from her last delivery.  MOB admits that she "didn't want to be pregnant and was not happy about having another baby" at the time.  She states she started using drugs, but that this was an isolated time.  She states no use prior and no use since.  She told CSW that CPS remained involved for 4-5 months and that she had to pass many random drug screens during that time.  MOB was understanding of hospital drug screen policy and confident that baby will not be positive.  Baby's UDS is negative.  MOB was positive for Opiates on 12/26/16 and states she has an Rx for Oxycodone for back pain.  CSW confirms that there is a prescription in MOB's record.  She reports that she took the medication sparingly and estimates 10mg per week at most.  CSW will monitor CDS result and make report accordingly.  CSW explained referral for CC4C due to substance exposure but notes no concern over use of prescribed substances.  MOB stated understanding.   CSW provided education regarding PMADs and assessed MOB's current mental health.  MOB states she has dealt with Anxiety now and in the past.  She states she was prescribed Valium at one time, but felt the medication made her too sleepy.  CSW discussed the different in an SSRI and a Benzodiazepine.  MOB is not interested in medication at this time, but is interested in outpatient counseling recommended by CSW.  CSW provided her with a list of female providers (MOB's preference) in her area who accept Medicaid.  MOB was appreciative and denies need for referral to be made by CSW.  MOB asked if she can stay in touch with CSW and follow up for ongoing support if needed.  CSW agrees and provided contact information.  MOB seemed very appreciative of CSW's concern for her emotional wellbeing.  CSW provided MOB with a New Mom Checklist as a way to self-evaluate during the postpartum period.   MOB states feeling content and confident in her decision to parent  baby.  She thanked CSW for the support and states no further needs at this time.  CSW feels there are no barriers to discharge unless MOB does not remain involved with baby while baby remains in the hospital.  CSW left report for CSW covering tomorrow to follow up as CSW will be concerned if there is lack of involvement.     CSW Plan/Description:  Information/Referral to Community Resources , No Further Intervention Required/No Barriers to Discharge, Patient/Family Education     Krystofer Hevener Elizabeth, LCSW 01/16/2017, 11:37 PM  

## 2017-01-31 ENCOUNTER — Ambulatory Visit: Payer: Medicaid Other | Admitting: Obstetrics & Gynecology

## 2017-04-23 ENCOUNTER — Inpatient Hospital Stay (HOSPITAL_COMMUNITY)
Admission: EM | Admit: 2017-04-23 | Discharge: 2017-04-26 | DRG: 918 | Disposition: A | Discharge status: Home / Self Care | Payer: Medicaid Other | Attending: Internal Medicine | Admitting: Internal Medicine

## 2017-04-23 DIAGNOSIS — Y906 Blood alcohol level of 120-199 mg/100 ml: Secondary | ICD-10-CM | POA: Diagnosis present

## 2017-04-23 DIAGNOSIS — Z886 Allergy status to analgesic agent status: Secondary | ICD-10-CM

## 2017-04-23 DIAGNOSIS — T391X2A Poisoning by 4-Aminophenol derivatives, intentional self-harm, initial encounter: Secondary | ICD-10-CM

## 2017-04-23 DIAGNOSIS — F1090 Alcohol use, unspecified, uncomplicated: Secondary | ICD-10-CM | POA: Diagnosis present

## 2017-04-23 DIAGNOSIS — R000 Tachycardia, unspecified: Secondary | ICD-10-CM | POA: Diagnosis present

## 2017-04-23 DIAGNOSIS — R51 Headache: Secondary | ICD-10-CM

## 2017-04-23 DIAGNOSIS — T50902A Poisoning by unspecified drugs, medicaments and biological substances, intentional self-harm, initial encounter: Secondary | ICD-10-CM

## 2017-04-23 DIAGNOSIS — Z792 Long term (current) use of antibiotics: Secondary | ICD-10-CM

## 2017-04-23 DIAGNOSIS — Z885 Allergy status to narcotic agent status: Secondary | ICD-10-CM

## 2017-04-23 DIAGNOSIS — F4329 Adjustment disorder with other symptoms: Secondary | ICD-10-CM | POA: Diagnosis present

## 2017-04-23 DIAGNOSIS — Z7289 Other problems related to lifestyle: Secondary | ICD-10-CM | POA: Diagnosis present

## 2017-04-23 DIAGNOSIS — F17200 Nicotine dependence, unspecified, uncomplicated: Secondary | ICD-10-CM | POA: Diagnosis present

## 2017-04-23 DIAGNOSIS — T391X1A Poisoning by 4-Aminophenol derivatives, accidental (unintentional), initial encounter: Secondary | ICD-10-CM | POA: Diagnosis present

## 2017-04-23 DIAGNOSIS — Z881 Allergy status to other antibiotic agents status: Secondary | ICD-10-CM

## 2017-04-23 DIAGNOSIS — F329 Major depressive disorder, single episode, unspecified: Secondary | ICD-10-CM | POA: Diagnosis present

## 2017-04-23 DIAGNOSIS — I10 Essential (primary) hypertension: Secondary | ICD-10-CM | POA: Diagnosis present

## 2017-04-23 DIAGNOSIS — R519 Headache, unspecified: Secondary | ICD-10-CM | POA: Diagnosis present

## 2017-04-23 DIAGNOSIS — Z79899 Other long term (current) drug therapy: Secondary | ICD-10-CM

## 2017-04-23 DIAGNOSIS — F4322 Adjustment disorder with anxiety: Secondary | ICD-10-CM | POA: Diagnosis present

## 2017-04-23 DIAGNOSIS — G4700 Insomnia, unspecified: Secondary | ICD-10-CM | POA: Diagnosis present

## 2017-04-23 DIAGNOSIS — G8929 Other chronic pain: Secondary | ICD-10-CM | POA: Diagnosis present

## 2017-04-23 DIAGNOSIS — F430 Acute stress reaction: Secondary | ICD-10-CM | POA: Diagnosis present

## 2017-04-23 DIAGNOSIS — F17210 Nicotine dependence, cigarettes, uncomplicated: Secondary | ICD-10-CM | POA: Diagnosis present

## 2017-04-23 DIAGNOSIS — E876 Hypokalemia: Secondary | ICD-10-CM | POA: Diagnosis present

## 2017-04-23 DIAGNOSIS — R0602 Shortness of breath: Secondary | ICD-10-CM | POA: Diagnosis present

## 2017-04-23 DIAGNOSIS — Z789 Other specified health status: Secondary | ICD-10-CM | POA: Diagnosis present

## 2017-04-23 DIAGNOSIS — R569 Unspecified convulsions: Secondary | ICD-10-CM

## 2017-04-23 NOTE — ED Triage Notes (Addendum)
[facility] for overdose of 10 Tylenol PM tablets at appx 1500. Pt reported to EMS SI. Pt currently alert and oriented at this time.

## 2017-04-24 ENCOUNTER — Encounter (HOSPITAL_COMMUNITY): Payer: Self-pay | Admitting: Emergency Medicine

## 2017-04-24 DIAGNOSIS — Z881 Allergy status to other antibiotic agents status: Secondary | ICD-10-CM | POA: Diagnosis not present

## 2017-04-24 DIAGNOSIS — I10 Essential (primary) hypertension: Secondary | ICD-10-CM | POA: Diagnosis not present

## 2017-04-24 DIAGNOSIS — G8929 Other chronic pain: Secondary | ICD-10-CM | POA: Diagnosis not present

## 2017-04-24 DIAGNOSIS — T391X2A Poisoning by 4-Aminophenol derivatives, intentional self-harm, initial encounter: Principal | ICD-10-CM

## 2017-04-24 DIAGNOSIS — T391X1A Poisoning by 4-Aminophenol derivatives, accidental (unintentional), initial encounter: Secondary | ICD-10-CM | POA: Insufficient documentation

## 2017-04-24 DIAGNOSIS — Z789 Other specified health status: Secondary | ICD-10-CM | POA: Diagnosis not present

## 2017-04-24 DIAGNOSIS — R569 Unspecified convulsions: Secondary | ICD-10-CM | POA: Diagnosis not present

## 2017-04-24 DIAGNOSIS — F419 Anxiety disorder, unspecified: Secondary | ICD-10-CM | POA: Diagnosis not present

## 2017-04-24 DIAGNOSIS — Z79899 Other long term (current) drug therapy: Secondary | ICD-10-CM | POA: Diagnosis not present

## 2017-04-24 DIAGNOSIS — T50902A Poisoning by unspecified drugs, medicaments and biological substances, intentional self-harm, initial encounter: Secondary | ICD-10-CM | POA: Insufficient documentation

## 2017-04-24 DIAGNOSIS — G4700 Insomnia, unspecified: Secondary | ICD-10-CM | POA: Diagnosis not present

## 2017-04-24 DIAGNOSIS — F4322 Adjustment disorder with anxiety: Secondary | ICD-10-CM | POA: Diagnosis not present

## 2017-04-24 DIAGNOSIS — F4329 Adjustment disorder with other symptoms: Secondary | ICD-10-CM | POA: Diagnosis not present

## 2017-04-24 DIAGNOSIS — F4325 Adjustment disorder with mixed disturbance of emotions and conduct: Secondary | ICD-10-CM | POA: Diagnosis not present

## 2017-04-24 DIAGNOSIS — R519 Headache, unspecified: Secondary | ICD-10-CM | POA: Diagnosis present

## 2017-04-24 DIAGNOSIS — F430 Acute stress reaction: Secondary | ICD-10-CM | POA: Diagnosis not present

## 2017-04-24 DIAGNOSIS — Z630 Problems in relationship with spouse or partner: Secondary | ICD-10-CM | POA: Diagnosis not present

## 2017-04-24 DIAGNOSIS — Z87891 Personal history of nicotine dependence: Secondary | ICD-10-CM | POA: Diagnosis not present

## 2017-04-24 DIAGNOSIS — R0602 Shortness of breath: Secondary | ICD-10-CM | POA: Diagnosis not present

## 2017-04-24 DIAGNOSIS — Z7289 Other problems related to lifestyle: Secondary | ICD-10-CM | POA: Diagnosis present

## 2017-04-24 DIAGNOSIS — R51 Headache: Secondary | ICD-10-CM | POA: Diagnosis not present

## 2017-04-24 DIAGNOSIS — Y906 Blood alcohol level of 120-199 mg/100 ml: Secondary | ICD-10-CM | POA: Diagnosis present

## 2017-04-24 DIAGNOSIS — F17210 Nicotine dependence, cigarettes, uncomplicated: Secondary | ICD-10-CM | POA: Diagnosis present

## 2017-04-24 DIAGNOSIS — E876 Hypokalemia: Secondary | ICD-10-CM | POA: Diagnosis not present

## 2017-04-24 DIAGNOSIS — E786 Lipoprotein deficiency: Secondary | ICD-10-CM | POA: Diagnosis not present

## 2017-04-24 DIAGNOSIS — Z885 Allergy status to narcotic agent status: Secondary | ICD-10-CM | POA: Diagnosis not present

## 2017-04-24 DIAGNOSIS — F17200 Nicotine dependence, unspecified, uncomplicated: Secondary | ICD-10-CM | POA: Diagnosis not present

## 2017-04-24 DIAGNOSIS — F329 Major depressive disorder, single episode, unspecified: Secondary | ICD-10-CM | POA: Diagnosis not present

## 2017-04-24 DIAGNOSIS — R000 Tachycardia, unspecified: Secondary | ICD-10-CM | POA: Diagnosis not present

## 2017-04-24 DIAGNOSIS — Z792 Long term (current) use of antibiotics: Secondary | ICD-10-CM | POA: Diagnosis not present

## 2017-04-24 DIAGNOSIS — F1090 Alcohol use, unspecified, uncomplicated: Secondary | ICD-10-CM

## 2017-04-24 DIAGNOSIS — T391X2D Poisoning by 4-Aminophenol derivatives, intentional self-harm, subsequent encounter: Secondary | ICD-10-CM

## 2017-04-24 DIAGNOSIS — Z886 Allergy status to analgesic agent status: Secondary | ICD-10-CM | POA: Diagnosis not present

## 2017-04-24 DIAGNOSIS — I361 Nonrheumatic tricuspid (valve) insufficiency: Secondary | ICD-10-CM | POA: Diagnosis not present

## 2017-04-24 HISTORY — DX: Other specified health status: Z78.9

## 2017-04-24 HISTORY — DX: Alcohol use, unspecified, uncomplicated: F10.90

## 2017-04-24 HISTORY — DX: Other problems related to lifestyle: Z72.89

## 2017-04-24 LAB — COMPREHENSIVE METABOLIC PANEL
ALBUMIN: 4 g/dL (ref 3.5–5.0)
ALBUMIN: 4.7 g/dL (ref 3.5–5.0)
ALK PHOS: 60 U/L (ref 38–126)
ALT: 16 U/L (ref 14–54)
ALT: 17 U/L (ref 14–54)
ANION GAP: 10 (ref 5–15)
ANION GAP: 13 (ref 5–15)
AST: 18 U/L (ref 15–41)
AST: 18 U/L (ref 15–41)
Alkaline Phosphatase: 79 U/L (ref 38–126)
BUN: 9 mg/dL (ref 6–20)
BUN: 7 mg/dL (ref 6–20)
CHLORIDE: 108 mmol/L (ref 101–111)
CHLORIDE: 110 mmol/L (ref 101–111)
CO2: 20 mmol/L — ABNORMAL LOW (ref 22–32)
CO2: 26 mmol/L (ref 22–32)
Calcium: 9.4 mg/dL (ref 8.9–10.3)
Calcium: 8.4 mg/dL — ABNORMAL LOW (ref 8.9–10.3)
Creatinine, Ser: 0.72 mg/dL (ref 0.44–1.00)
Creatinine, Ser: 0.59 mg/dL (ref 0.44–1.00)
GFR calc Af Amer: >60 mL/min (ref >60)
GFR calc non Af Amer: >60 mL/min (ref >60)
GFR calc non Af Amer: >60 mL/min (ref >60)
GLUCOSE: 101 mg/dL — AB (ref 65–99)
Glucose, Bld: 88 mg/dL (ref 65–99)
POTASSIUM: 3.8 mmol/L (ref 3.5–5.1)
Potassium: 3.4 mmol/L — ABNORMAL LOW (ref 3.5–5.1)
SODIUM: 146 mmol/L — AB (ref 135–145)
Sodium: 141 mmol/L (ref 135–145)
TOTAL PROTEIN: 7.5 g/dL (ref 6.5–8.1)
Total Bilirubin: 0.3 mg/dL (ref 0.3–1.2)
Total Bilirubin: 0.7 mg/dL (ref 0.3–1.2)
Total Protein: 8.4 g/dL — ABNORMAL HIGH (ref 6.5–8.1)

## 2017-04-24 LAB — CBC
HEMATOCRIT: 36.4 % (ref 36.0–46.0)
HEMOGLOBIN: 12.1 g/dL (ref 12.0–15.0)
MCH: 28.3 pg (ref 26.0–34.0)
MCHC: 33.2 g/dL (ref 30.0–36.0)
MCV: 85.2 fL (ref 78.0–100.0)
Platelets: 318 10*3/uL (ref 150–400)
RBC: 4.27 MIL/uL (ref 3.87–5.11)
RDW: 16.5 % — AB (ref 11.5–15.5)
WBC: 4.7 10*3/uL (ref 4.0–10.5)

## 2017-04-24 LAB — RAPID URINE DRUG SCREEN, HOSP PERFORMED
Amphetamines: NOT DETECTED
BARBITURATES: NOT DETECTED
BENZODIAZEPINES: NOT DETECTED
COCAINE: NOT DETECTED
Opiates: NOT DETECTED
TETRAHYDROCANNABINOL: NOT DETECTED

## 2017-04-24 LAB — CBC WITH DIFFERENTIAL/PLATELET
BASOS PCT: 1 %
Basophils Absolute: 0 10*3/uL (ref 0.0–0.1)
EOS ABS: 0.1 10*3/uL (ref 0.0–0.7)
EOS PCT: 2 %
HCT: 40.4 % (ref 36.0–46.0)
Hemoglobin: 13.1 g/dL (ref 12.0–15.0)
LYMPHS ABS: 3.5 10*3/uL (ref 0.7–4.0)
Lymphocytes Relative: 54 %
MCH: 27.8 pg (ref 26.0–34.0)
MCHC: 32.4 g/dL (ref 30.0–36.0)
MCV: 85.8 fL (ref 78.0–100.0)
Monocytes Absolute: 0.3 10*3/uL (ref 0.1–1.0)
Monocytes Relative: 5 %
Neutro Abs: 2.4 10*3/uL (ref 1.7–7.7)
Neutrophils Relative %: 38 %
PLATELETS: 362 10*3/uL (ref 150–400)
RBC: 4.71 MIL/uL (ref 3.87–5.11)
RDW: 16.6 % — ABNORMAL HIGH (ref 11.5–15.5)
WBC: 6.4 10*3/uL (ref 4.0–10.5)

## 2017-04-24 LAB — SALICYLATE LEVEL: Salicylate Lvl: <7 mg/dL (ref 2.8–30.0)

## 2017-04-24 LAB — GLUCOSE, CAPILLARY: Glucose-Capillary: 112 mg/dL — ABNORMAL HIGH (ref 65–99)

## 2017-04-24 LAB — ACETAMINOPHEN LEVEL
Acetaminophen (Tylenol), Serum: 130 ug/mL — ABNORMAL HIGH (ref 10–30)
Acetaminophen (Tylenol), Serum: 35 ug/mL — ABNORMAL HIGH (ref 10–30)

## 2017-04-24 LAB — PROTIME-INR
INR: 1.09
Prothrombin Time: 14.2 seconds (ref 11.4–15.2)

## 2017-04-24 LAB — ETHANOL: Alcohol, Ethyl (B): 120 mg/dL — ABNORMAL HIGH (ref <5)

## 2017-04-24 LAB — MRSA PCR SCREENING: MRSA by PCR: NEGATIVE

## 2017-04-24 LAB — POC URINE PREG, ED: PREG TEST UR: NEGATIVE

## 2017-04-24 LAB — APTT: APTT: 28 s (ref 24–36)

## 2017-04-24 MED ORDER — FOLIC ACID 1 MG PO TABS
1.0000 mg | ORAL_TABLET | Freq: Every day | ORAL | Status: DC
  Administered 2017-04-24 – 2017-04-26 (×3): 1 mg via ORAL
  Filled 2017-04-24 (×3): qty 1

## 2017-04-24 MED ORDER — METOPROLOL TARTRATE 5 MG/5ML IV SOLN: 5.0000 mg | Freq: Three times a day (TID) | INTRAVENOUS | Status: DC

## 2017-04-24 MED ORDER — HYDRALAZINE HCL 20 MG/ML IJ SOLN
10.0000 mg | Freq: Four times a day (QID) | INTRAMUSCULAR | Status: DC | PRN
  Administered 2017-04-24 – 2017-04-25 (×2): 10 mg via INTRAVENOUS
  Filled 2017-04-24 (×3): qty 1

## 2017-04-24 MED ORDER — VITAMIN B-1 100 MG PO TABS
100.0000 mg | ORAL_TABLET | Freq: Every day | ORAL | Status: DC
  Administered 2017-04-24 – 2017-04-26 (×3): 100 mg via ORAL
  Filled 2017-04-24 (×3): qty 1

## 2017-04-24 MED ORDER — TRAZODONE HCL 50 MG PO TABS
50.0000 mg | ORAL_TABLET | Freq: Every day | ORAL | Status: DC
  Administered 2017-04-24 – 2017-04-25 (×2): 50 mg via ORAL
  Filled 2017-04-24 (×2): qty 1

## 2017-04-24 MED ORDER — LORAZEPAM 1 MG PO TABS
1.0000 mg | ORAL_TABLET | Freq: Four times a day (QID) | ORAL | Status: DC | PRN
  Administered 2017-04-24 – 2017-04-26 (×3): 1 mg via ORAL
  Filled 2017-04-24 (×3): qty 1

## 2017-04-24 MED ORDER — LORAZEPAM 2 MG/ML IJ SOLN
0.0000 mg | Freq: Two times a day (BID) | INTRAMUSCULAR | Status: DC
Start: 2017-04-26 — End: 2017-04-26
  Administered 2017-04-26: 2 mg via INTRAVENOUS
  Filled 2017-04-24: qty 1

## 2017-04-24 MED ORDER — THIAMINE HCL 100 MG/ML IJ SOLN: 100.0000 mg | Freq: Every day | INTRAMUSCULAR | Status: DC

## 2017-04-24 MED ORDER — PROCHLORPERAZINE EDISYLATE 5 MG/ML IJ SOLN
10.0000 mg | Freq: Once | INTRAMUSCULAR | Status: AC
  Administered 2017-04-24: 10 mg via INTRAVENOUS
  Filled 2017-04-24: qty 2

## 2017-04-24 MED ORDER — LORAZEPAM 1 MG PO TABS
1.0000 mg | ORAL_TABLET | Freq: Once | ORAL | Status: AC
  Administered 2017-04-24: 1 mg via ORAL
  Filled 2017-04-24: qty 1

## 2017-04-24 MED ORDER — ACETYLCYSTEINE LOAD VIA INFUSION
150.0000 mg/kg | Freq: Once | INTRAVENOUS | Status: DC
  Administered 2017-04-24: 12240 mg via INTRAVENOUS
  Filled 2017-04-24: qty 306

## 2017-04-24 MED ORDER — LORAZEPAM 2 MG/ML IJ SOLN
1.0000 mg | Freq: Four times a day (QID) | INTRAMUSCULAR | Status: DC | PRN
  Administered 2017-04-25 (×2): 1 mg via INTRAVENOUS
  Filled 2017-04-24 (×2): qty 1

## 2017-04-24 MED ORDER — ONDANSETRON HCL 4 MG PO TABS: 4.0000 mg | ORAL_TABLET | Freq: Four times a day (QID) | ORAL | Status: DC | PRN

## 2017-04-24 MED ORDER — ONDANSETRON HCL 4 MG/2ML IJ SOLN
4.0000 mg | Freq: Four times a day (QID) | INTRAMUSCULAR | Status: DC | PRN
  Administered 2017-04-25 – 2017-04-26 (×2): 4 mg via INTRAVENOUS
  Filled 2017-04-24 (×2): qty 2

## 2017-04-24 MED ORDER — AMLODIPINE BESYLATE 5 MG PO TABS
5.0000 mg | ORAL_TABLET | Freq: Once | ORAL | Status: AC
  Administered 2017-04-24: 5 mg via ORAL
  Filled 2017-04-24: qty 1

## 2017-04-24 MED ORDER — ZOLPIDEM TARTRATE 5 MG PO TABS: 5.0000 mg | ORAL_TABLET | Freq: Every evening | ORAL | Status: DC | PRN

## 2017-04-24 MED ORDER — AMLODIPINE BESYLATE 5 MG PO TABS: 5.0000 mg | ORAL_TABLET | Freq: Every day | ORAL | Status: DC

## 2017-04-24 MED ORDER — SODIUM CHLORIDE 0.9 % IV SOLN
INTRAVENOUS | Status: DC
  Administered 2017-04-24 – 2017-04-25 (×4): via INTRAVENOUS

## 2017-04-24 MED ORDER — POTASSIUM CHLORIDE 20 MEQ/15ML (10%) PO SOLN
20.0000 meq | Freq: Once | ORAL | Status: DC
  Filled 2017-04-24: qty 15

## 2017-04-24 MED ORDER — DEXTROSE 5 % IV SOLN
15.0000 mg/kg/h | INTRAVENOUS | Status: DC
  Administered 2017-04-24: 15 mg/kg/h via INTRAVENOUS
  Filled 2017-04-24: qty 200

## 2017-04-24 MED ORDER — AMLODIPINE BESYLATE 10 MG PO TABS
10.0000 mg | ORAL_TABLET | Freq: Every day | ORAL | Status: DC
  Administered 2017-04-25: 10 mg via ORAL
  Filled 2017-04-24 (×2): qty 1

## 2017-04-24 MED ORDER — IBUPROFEN 200 MG PO TABS
400.0000 mg | ORAL_TABLET | Freq: Four times a day (QID) | ORAL | Status: DC | PRN
  Administered 2017-04-24 – 2017-04-25 (×2): 400 mg via ORAL
  Filled 2017-04-24 (×3): qty 2

## 2017-04-24 MED ORDER — LORAZEPAM 2 MG/ML IJ SOLN
0.0000 mg | Freq: Four times a day (QID) | INTRAMUSCULAR | Status: AC
Start: 2017-04-24 — End: 2017-04-26
  Administered 2017-04-24: 2 mg via INTRAVENOUS
  Administered 2017-04-24: 1 mg via INTRAVENOUS
  Administered 2017-04-25 (×2): 2 mg via INTRAVENOUS
  Filled 2017-04-24 (×4): qty 1

## 2017-04-24 MED ORDER — LORAZEPAM 2 MG/ML IJ SOLN
1.0000 mg | INTRAMUSCULAR | Status: DC | PRN
  Filled 2017-04-24: qty 1

## 2017-04-24 MED ORDER — AMLODIPINE BESYLATE 5 MG PO TABS
5.0000 mg | ORAL_TABLET | Freq: Every day | ORAL | Status: DC
  Administered 2017-04-24: 5 mg via ORAL
  Filled 2017-04-24: qty 1

## 2017-04-24 MED ORDER — ENOXAPARIN SODIUM 40 MG/0.4ML ~~LOC~~ SOLN
40.0000 mg | SUBCUTANEOUS | Status: DC
  Administered 2017-04-24 – 2017-04-26 (×3): 40 mg via SUBCUTANEOUS
  Filled 2017-04-24 (×3): qty 0.4

## 2017-04-24 MED ORDER — ADULT MULTIVITAMIN W/MINERALS CH
1.0000 | ORAL_TABLET | Freq: Every day | ORAL | Status: DC
  Administered 2017-04-24 – 2017-04-26 (×3): 1 via ORAL
  Filled 2017-04-24 (×3): qty 1

## 2017-04-24 NOTE — Consult Note (Signed)
[place] Consult   Reason for Consult:  Tylenol Overdose Referring Physician:  Dr. Grandville Silos Patient Identification: Kristi Marshall MRN:  [id number] Principal Diagnosis: Overdose on Tylenol Diagnosis:   Patient Active Problem List   Diagnosis Date Noted  . Alcohol use [Z78.9] 04/24/2017  . Overdose on Tylenol [T39.1X1A] 04/24/2017  . Hypokalemia [E87.6] 04/24/2017  . Headache [R51] 04/24/2017  . HTN (hypertension) [I10] 04/24/2017  . Intentional drug overdose ([place]) [T50.902A]   . Acetaminophen toxicity [T39.1X1A]   . Normal labor [O80, Z37.9] 01/14/2017  . Vaginal delivery [O80] 01/09/2015  . Pyelonephritis affecting pregnancy in second trimester, antepartum [O23.02] 08/09/2014  . Pyelonephritis [N12] 03/22/2014  . Menorrhagia [N92.0] 07/31/2012  . Dysmenorrhea [N94.6] 07/31/2012  . Single episode of elevated blood pressure [R03.0] 07/31/2012  . Seizure-like activity ([place]) [R56.9] 06/20/2012  . 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 ALLERGIC RHINITIS [J30.9] 12/17/2009  . TOBACCO USER [F17.200] 05/24/2009  . SEIZURE DISORDER [R56.9] 12/20/2008    Total Time spent with patient: 45 minutes  Subjective:   Kristi Marshall is a 31 y.o. female patient admitted after she overdosed on Tylenol.  HPI:  Patient who reports history of Anxiety, headaches, pseudoseizure but was not receiving any help for Anxiety prior to this current admission.Patient reports that she overdosed on an unknown quantity of Tylenol after she  got in an argument with her baby's father. She denied trying to kill herself but states that she was trying to get attention from her baby's father. Patient reports that she has been feeling stressed out taking care of her children. She denies having alcohol problem and describe herself as ''social drinker.'' Patient Tylenol level was 130 on admission but now trending down, it is 35 today. Today, patient is calm, cooperative, denies SI/HI, delusions or  psychosis. She states: ''I love my children, I will never kill myself.'' However, patient agreed to outpatient referral for counseling.   Past Psychiatric History: as above  Risk to Self: Is patient at risk for suicide?: Not currently Risk to Others:   Denies by patient  Prior Inpatient Therapy:   None reported Prior Outpatient Therapy:   None  Past Medical History:  Past Medical History:  Diagnosis Date  . Anxiety   . Bronchitis   . Headache    migraines  . PIH (pregnancy induced hypertension)   . Pregnancy induced hypertension   . Pyelonephritis   . Seasonal allergies   . Seizure [facility])    pseudoseizures; stress related    Past Surgical History:  Procedure Laterality Date  . abortions    . DILATION AND CURETTAGE OF UTERUS     ? retained POC    Family History:  Family History  Problem Relation Age of Onset  . Hypertension Mother   . Diabetes Maternal Aunt   . Hypertension Maternal Grandmother   . Diabetes Maternal Grandmother   . Heart disease Maternal Grandmother   . Hypertension Maternal Grandfather   . Heart disease Maternal Grandfather   . Anesthesia problems Neg Hx   . Hypotension Neg Hx   . Malignant hyperthermia Neg Hx   . Pseudochol deficiency Neg Hx   . Other Neg Hx   . Hearing loss Neg Hx    Family Psychiatric  History:  Social History:  History  Alcohol Use  . Yes    Comment: beer tonight     History  Drug Use No    Social History   Social History  . Marital status: Single  Spouse name: N/A  . Number of children: N/A  . Years of education: N/A   Social History Main Topics  . Smoking status: Former Smoker    Years: 7.00    Types: Cigarettes    Quit date: 05/24/2016  . Smokeless tobacco: Former Systems developer     Comment: Second hand smoke  . Alcohol use Yes     Comment: beer tonight  . Drug use: No  . Sexual activity: Yes     Comment: last intercourse over a month ago   Other Topics Concern  . None   Social History Narrative  . None    Additional Social History:    Allergies:   Allergies  Allergen Reactions  . Bactrim [Sulfamethoxazole-Trimethoprim] Nausea And Vomiting and Other (See Comments)    high fevers.   . Ibuprofen Itching  . Toradol [Ketorolac Tromethamine] Itching    Labs:  Results for orders placed or performed during the hospital encounter of 04/23/17 (from the past 48 hour(s))  CBC with Differential     Status: Abnormal   Collection Time: 04/23/17 11:50 PM  Result Value Ref Range   WBC 6.4 4.0 - 10.5 K/uL   RBC 4.71 3.87 - 5.11 MIL/uL   Hemoglobin 13.1 12.0 - 15.0 g/dL   HCT 40.4 36.0 - 46.0 %   MCV 85.8 78.0 - 100.0 fL   MCH 27.8 26.0 - 34.0 pg   MCHC 32.4 30.0 - 36.0 g/dL   RDW 16.6 (H) 11.5 - 15.5 %   Platelets 362 150 - 400 K/uL   Neutrophils Relative % 38 %   Neutro Abs 2.4 1.7 - 7.7 K/uL   Lymphocytes Relative 54 %   Lymphs Abs 3.5 0.7 - 4.0 K/uL   Monocytes Relative 5 %   Monocytes Absolute 0.3 0.1 - 1.0 K/uL   Eosinophils Relative 2 %   Eosinophils Absolute 0.1 0.0 - 0.7 K/uL   Basophils Relative 1 %   Basophils Absolute 0.0 0.0 - 0.1 K/uL  Comprehensive metabolic panel     Status: Abnormal   Collection Time: 04/23/17 11:50 PM  Result Value Ref Range   Sodium 146 (H) 135 - 145 mmol/L   Potassium 3.4 (L) 3.5 - 5.1 mmol/L   Chloride 110 101 - 111 mmol/L   CO2 26 22 - 32 mmol/L   Glucose, Bld 88 65 - 99 mg/dL   BUN 9 6 - 20 mg/dL   Creatinine, Ser 0.72 0.44 - 1.00 mg/dL   Calcium 9.4 8.9 - 10.3 mg/dL   Total Protein 8.4 (H) 6.5 - 8.1 g/dL   Albumin 4.7 3.5 - 5.0 g/dL   AST 18 15 - 41 U/L   ALT 17 14 - 54 U/L   Alkaline Phosphatase 79 38 - 126 U/L   Total Bilirubin 0.3 0.3 - 1.2 mg/dL   GFR calc non Af Amer >60 >60 mL/min   GFR calc Af Amer >60 >60 mL/min    Comment: (NOTE) The eGFR has been calculated using the CKD EPI equation. This calculation has not been validated in all clinical situations. eGFR's persistently <60 mL/min signify possible Chronic Kidney Disease.     Anion gap 10 5 - 15  Rapid urine drug screen (hospital performed)     Status: None   Collection Time: 04/23/17 11:51 PM  Result Value Ref Range   Opiates NONE DETECTED NONE DETECTED   Cocaine NONE DETECTED NONE DETECTED   Benzodiazepines NONE DETECTED NONE DETECTED   Amphetamines NONE DETECTED NONE DETECTED  Tetrahydrocannabinol NONE DETECTED NONE DETECTED   Barbiturates NONE DETECTED NONE DETECTED    Comment:        DRUG SCREEN FOR MEDICAL PURPOSES ONLY.  IF CONFIRMATION IS NEEDED FOR ANY PURPOSE, NOTIFY LAB WITHIN 5 DAYS.        LOWEST DETECTABLE LIMITS FOR URINE DRUG SCREEN Drug Class       Cutoff (ng/mL) Amphetamine      1000 Barbiturate      200 Benzodiazepine   784 Tricyclics       696 Opiates          300 Cocaine          300 THC              50   Ethanol     Status: Abnormal   Collection Time: 04/23/17 11:51 PM  Result Value Ref Range   Alcohol, Ethyl (B) 120 (H) <5 mg/dL    Comment:        LOWEST DETECTABLE LIMIT FOR SERUM ALCOHOL IS 5 mg/dL FOR MEDICAL PURPOSES ONLY   Acetaminophen level     Status: Abnormal   Collection Time: 04/23/17 11:51 PM  Result Value Ref Range   Acetaminophen (Tylenol), Serum 130 (H) 10 - 30 ug/mL    Comment:        THERAPEUTIC CONCENTRATIONS VARY SIGNIFICANTLY. A RANGE OF 10-30 ug/mL MAY BE AN EFFECTIVE CONCENTRATION FOR MANY PATIENTS. HOWEVER, SOME ARE BEST TREATED AT CONCENTRATIONS OUTSIDE THIS RANGE. ACETAMINOPHEN CONCENTRATIONS >150 ug/mL AT 4 HOURS AFTER INGESTION AND >50 ug/mL AT 12 HOURS AFTER INGESTION ARE OFTEN ASSOCIATED WITH TOXIC REACTIONS.   Salicylate level     Status: None   Collection Time: 04/23/17 11:51 PM  Result Value Ref Range   Salicylate Lvl <2.9 2.8 - 30.0 mg/dL  POC Urine Pregnancy, ED (do NOT order at [facility])     Status: None   Collection Time: 04/24/17 12:55 AM  Result Value Ref Range   Preg Test, Ur NEGATIVE NEGATIVE    Comment:        THE SENSITIVITY OF THIS METHODOLOGY IS >24 mIU/mL    MRSA PCR Screening     Status: None   Collection Time: 04/24/17  5:31 AM  Result Value Ref Range   MRSA by PCR NEGATIVE NEGATIVE    Comment:        The GeneXpert MRSA Assay (FDA approved for NASAL specimens only), is one component of a comprehensive MRSA colonization surveillance program. It is not intended to diagnose MRSA infection nor to guide or monitor treatment for MRSA infections.   Protime-INR     Status: None   Collection Time: 04/24/17  7:15 AM  Result Value Ref Range   Prothrombin Time 14.2 11.4 - 15.2 seconds   INR 1.09   APTT     Status: None   Collection Time: 04/24/17  7:15 AM  Result Value Ref Range   aPTT 28 24 - 36 seconds  Acetaminophen level     Status: Abnormal   Collection Time: 04/24/17  7:15 AM  Result Value Ref Range   Acetaminophen (Tylenol), Serum 35 (H) 10 - 30 ug/mL    Comment:        THERAPEUTIC CONCENTRATIONS VARY SIGNIFICANTLY. A RANGE OF 10-30 ug/mL MAY BE AN EFFECTIVE CONCENTRATION FOR MANY PATIENTS. HOWEVER, SOME ARE BEST TREATED AT CONCENTRATIONS OUTSIDE THIS RANGE. ACETAMINOPHEN CONCENTRATIONS >150 ug/mL AT 4 HOURS AFTER INGESTION AND >50 ug/mL AT 12 HOURS AFTER INGESTION ARE OFTEN ASSOCIATED WITH TOXIC  REACTIONS.   Comprehensive metabolic panel     Status: Abnormal   Collection Time: 04/24/17  7:15 AM  Result Value Ref Range   Sodium 141 135 - 145 mmol/L   Potassium 3.8 3.5 - 5.1 mmol/L   Chloride 108 101 - 111 mmol/L   CO2 20 (L) 22 - 32 mmol/L   Glucose, Bld 101 (H) 65 - 99 mg/dL   BUN 7 6 - 20 mg/dL   Creatinine, Ser 0.59 0.44 - 1.00 mg/dL   Calcium 8.4 (L) 8.9 - 10.3 mg/dL   Total Protein 7.5 6.5 - 8.1 g/dL   Albumin 4.0 3.5 - 5.0 g/dL   AST 18 15 - 41 U/L   ALT 16 14 - 54 U/L   Alkaline Phosphatase 60 38 - 126 U/L   Total Bilirubin 0.7 0.3 - 1.2 mg/dL   GFR calc non Af Amer >60 >60 mL/min   GFR calc Af Amer >60 >60 mL/min    Comment: (NOTE) The eGFR has been calculated using the CKD EPI equation. This  calculation has not been validated in all clinical situations. eGFR's persistently <60 mL/min signify possible Chronic Kidney Disease.    Anion gap 13 5 - 15  CBC     Status: Abnormal   Collection Time: 04/24/17  7:15 AM  Result Value Ref Range   WBC 4.7 4.0 - 10.5 K/uL   RBC 4.27 3.87 - 5.11 MIL/uL   Hemoglobin 12.1 12.0 - 15.0 g/dL   HCT 36.4 36.0 - 46.0 %   MCV 85.2 78.0 - 100.0 fL   MCH 28.3 26.0 - 34.0 pg   MCHC 33.2 30.0 - 36.0 g/dL   RDW 16.5 (H) 11.5 - 15.5 %   Platelets 318 150 - 400 K/uL    Current Facility-Administered Medications  Medication Dose Route Frequency Provider Last Rate Last Dose  . 0.9 %  sodium chloride infusion   Intravenous Continuous Ivor Costa, MD 125 mL/hr at 04/24/17 0540    . acetylcysteine (ACETADOTE) 40,000 mg in dextrose 5 % 1,000 mL (40 mg/mL) infusion  15 mg/kg/hr Intravenous Continuous Dorrene German, RPH 30.6 mL/hr at 04/24/17 0600 15 mg/kg/hr at 04/24/17 0600  . amLODipine (NORVASC) tablet 5 mg  5 mg Oral Daily Eugenie Filler, MD   5 mg at 04/24/17 0912  . enoxaparin (LOVENOX) injection 40 mg  40 mg Subcutaneous Q24H Ivor Costa, MD   40 mg at 04/24/17 0911  . folic acid (FOLVITE) tablet 1 mg  1 mg Oral Daily Ivor Costa, MD   1 mg at 04/24/17 0911  . hydrALAZINE (APRESOLINE) injection 10 mg  10 mg Intravenous Q6H PRN Jani Gravel, MD   10 mg at 04/24/17 0557  . ibuprofen (ADVIL,MOTRIN) tablet 400 mg  400 mg Oral Q6H PRN Eugenie Filler, MD      . LORazepam (ATIVAN) injection 0-4 mg  0-4 mg Intravenous Q6H Ivor Costa, MD   1 mg at 04/24/17 0557   Followed by  . [START ON 04/26/2017] LORazepam (ATIVAN) injection 0-4 mg  0-4 mg Intravenous Q12H Ivor Costa, MD      . LORazepam (ATIVAN) injection 1 mg  1 mg Intravenous Q2H PRN Ivor Costa, MD      . LORazepam (ATIVAN) tablet 1 mg  1 mg Oral Q6H PRN Ivor Costa, MD   1 mg at 04/24/17 0959   Or  . LORazepam (ATIVAN) injection 1 mg  1 mg Intravenous Q6H PRN Ivor Costa, MD      .  multivitamin  with minerals tablet 1 tablet  1 tablet Oral Daily Ivor Costa, MD   1 tablet at 04/24/17 [phone]  . ondansetron (ZOFRAN) tablet 4 mg  4 mg Oral Q6H PRN Ivor Costa, MD       Or  . ondansetron [facility]) injection 4 mg  4 mg Intravenous Q6H PRN Ivor Costa, MD      . potassium chloride 20 MEQ/15ML (10%) solution 20 mEq  20 mEq Oral Once Ivor Costa, MD      . thiamine (VITAMIN B-1) tablet 100 mg  100 mg Oral Daily Ivor Costa, MD   100 mg at 04/24/17 0912  . traZODone (DESYREL) tablet 50 mg  50 mg Oral QHS Corena Pilgrim, MD        Musculoskeletal: Strength & Muscle Tone: within normal limits Gait & Station: normal Patient leans: N/A  Psychiatric Specialty Exam: Physical Exam  Psychiatric: Her behavior is normal. Judgment and thought content normal. Her mood appears anxious. Her speech is delayed. Cognition and memory are normal.    Review of Systems  Constitutional: Positive for malaise/fatigue.  HENT: Negative.   Eyes: Negative.   Respiratory: Negative.   Cardiovascular: Negative.   Gastrointestinal: Negative.   Genitourinary: Negative.   Musculoskeletal: Negative.   Skin: Negative.   Neurological: Negative.   Endo/Heme/Allergies: Negative.   Psychiatric/Behavioral: Positive for substance abuse. The patient is nervous/anxious.     Blood pressure (!) 147/99, pulse (!) 105, temperature 98.8 F (37.1 C), resp. rate 19, height '5\' 4"'  (1.626 m), weight 81.6 kg (180 lb), SpO2 99 %, unknown if currently breastfeeding.Body mass index is 30.9 kg/m.  General Appearance: Casual  Eye Contact:  Good  Speech:  Clear and Coherent  Volume:  Normal  Mood:  Euthymic  Affect:  Constricted  Thought Process:  Coherent and Descriptions of Associations: Intact  Orientation:  Full (Time, Place, and Person)  Thought Content:  Logical  Suicidal Thoughts:  No  Homicidal Thoughts:  No  Memory:  Immediate;   Good Recent;   Good Remote;   Good  Judgement:  Intact  Insight:  Fair  Psychomotor Activity:   Decreased  Concentration:  Concentration: Fair and Attention Span: Fair  Recall:  Good  Fund of Knowledge:  Good  Language:  Good  Akathisia:  No  Handed:  Right  AIMS (if indicated):     Assets:  Communication Skills Desire for Improvement Social Support  ADL's:  Intact  Cognition:  WNL  Sleep:   fair     Treatment Plan Summary: 31 year old woman with no prior significant history of mental illness except for a remote history of anxiety. She was brought for treatment after she overdosed on Tylenol which she claimed was a cry for attention from her baby's father and not a suicide attempt. She reports being under stress and has been argues with baby's father a lot lately. Today, patient denies SI/HI, psychosis and delusional thinking.  Assessment: Adjustment disorder with emotional disturbance  Plan:  Continue Lorazepam protocol for alcohol detox Discontinue Ambien, Start Trazodone 50 mg qhs at bedtime for aggression/insomnia Patient is cleared from psychiatric service, does not meet criteria for inpatient psychic admission but will benefit from outpatient counseling upon discharge.  Disposition: No evidence of imminent risk to self or others at present.   Patient does not meet criteria for psychiatric inpatient admission. Supportive therapy provided about ongoing stressors. Unit social worker to assist with outpatient referral for counseling upon discharge  Corena Pilgrim, MD  04/24/2017 12:17 PM

## 2017-04-24 NOTE — Progress Notes (Signed)
PROGRESS NOTE    Kristi Marshall  ZOX:[id number] DOB: 09-08-1986 DOA: 04/23/2017 PCP: Billee CashingMcKenzie, [place], MD   Brief Narrative:  Patient is a 31 year old female history of anxiety, headaches, pseudoseizure presented with Tylenol overdose. Patient placed in stepdown unit. Patient placed on NAC. Psych consultation pending.   Assessment & Plan:   Principal Problem:   Overdose on Tylenol Active Problems:   TOBACCO USER   Seizure-like activity (HCC)   Alcohol use   Hypokalemia   Headache   HTN (hypertension)  #1 overdose on Tylenol Patient states she took a bunch of Tylenol pills as she was upset. Concern for suicidal ideation. Tylenol level on admission was elevated at 120. Salicylate level is less than 7. UDS negative. Tylenol level decreasing and now at 35. LFTs within normal limits. INR at 1.09. EKG with a QTc of 461. Continue IV fluids, supportive care,NAC. Consult with psychiatry for further evaluation.  #2 hypertension Patient noted to have elevated blood pressures. Patient states has a history of hypertension and takes amlodipine but not sure the dose. Will start patient on Norvasc 5 mg daily and uptitrate as needed. Hydralazine as needed.  #3 alcohol use Continue the Ativan withdrawal protocol.  #4 headache Compazine 10 mg IV 1. Ibuprofen as needed.   #5 hypokalemia  Repleted.  #6 tobacco abuse Tobacco cessation. Patient states was 5-6 cigarettes a day depending on situation. Patient does not want a nicotine patch at this time.  #7 history of seizure-like activity/pseudoseizure Not on any medications. Monitor closely.   DVT prophylaxis: Lovenox Code Status: Full Family Communication: Updated patient. No family at bedside. Disposition Plan: Remain in step diet unit. Pending psychiatric evaluation.   Consultants:   Psychiatry: Pending  Procedures:   None  Antimicrobials:   None   Subjective: Patient complaining of a headache. Patient states took  the Tylenol pills she was upset. Patient denies any chest pain. No shortness of breath. Patient denies any abdominal pain. No seizures noted.  Objective: Vitals:   04/24/17 0630 04/24/17 0700 04/24/17 0802 04/24/17 0843  BP: (!) 175/125 (!) 168/111  (!) 160/100  Pulse: (!) 110 (!) 102    Resp: (!) 22 20    Temp:   98.8 F (37.1 C)   TempSrc:      SpO2: 100% 99%    Weight:      Height:        Intake/Output Summary (Last 24 hours) at 04/24/17 0900 Last data filed at 04/24/17 0600  Gross per 24 hour  Intake           477.72 ml  Output                0 ml  Net           477.72 ml   Filed Weights   04/24/17 0000  Weight: 81.6 kg (180 lb)    Examination:  General exam: Appears calm and comfortable  Respiratory system: Clear to auscultation. Respiratory effort normal. Cardiovascular system: S1 & S2 heard, RRR. No JVD, murmurs, rubs, gallops or clicks. No pedal edema. Gastrointestinal system: Abdomen is nondistended, soft and nontender. No organomegaly or masses felt. Normal bowel sounds heard. Central nervous system: Alert and oriented. No focal neurological deficits. Extremities: Symmetric 5 x 5 power. Skin: No rashes, lesions or ulcers Psychiatry: Judgement and insight appear normal. Mood & affect appropriate.     Data Reviewed: I have personally reviewed following labs and imaging studies  CBC:  Recent Labs Lab 04/23/17  2350 04/24/17 0715  WBC 6.4 4.7  NEUTROABS 2.4  --   HGB 13.1 12.1  HCT 40.4 36.4  MCV 85.8 85.2  PLT 362 318   Basic Metabolic Panel:  Recent Labs Lab 04/23/17 2350 04/24/17 0715  NA 146* 141  K 3.4* 3.8  CL 110 108  CO2 26 20*  GLUCOSE 88 101*  BUN 9 7  CREATININE 0.72 0.59  CALCIUM 9.4 8.4*   GFR: Estimated Creatinine Clearance: 107.3 mL/min (by C-G formula based on SCr of 0.59 mg/dL). Liver Function Tests:  Recent Labs Lab 04/23/17 2350 04/24/17 0715  AST 18 18  ALT 17 16  ALKPHOS 79 60  BILITOT 0.3 0.7  PROT 8.4* 7.5    ALBUMIN 4.7 4.0   No results for input(s): LIPASE, AMYLASE in the last 168 hours. No results for input(s): AMMONIA in the last 168 hours. Coagulation Profile:  Recent Labs Lab 04/24/17 0715  INR 1.09   Cardiac Enzymes: No results for input(s): CKTOTAL, CKMB, CKMBINDEX, TROPONINI in the last 168 hours. BNP (last 3 results) No results for input(s): PROBNP in the last 8760 hours. HbA1C: No results for input(s): HGBA1C in the last 72 hours. CBG: No results for input(s): GLUCAP in the last 168 hours. Lipid Profile: No results for input(s): CHOL, HDL, LDLCALC, TRIG, CHOLHDL, LDLDIRECT in the last 72 hours. Thyroid Function Tests: No results for input(s): TSH, T4TOTAL, FREET4, T3FREE, THYROIDAB in the last 72 hours. Anemia Panel: No results for input(s): VITAMINB12, FOLATE, FERRITIN, TIBC, IRON, RETICCTPCT in the last 72 hours. Sepsis Labs: No results for input(s): PROCALCITON, LATICACIDVEN in the last 168 hours.  Recent Results (from the past 240 hour(s))  MRSA PCR Screening     Status: None   Collection Time: 04/24/17  5:31 AM  Result Value Ref Range Status   MRSA by PCR NEGATIVE NEGATIVE Final    Comment:        The GeneXpert MRSA Assay (FDA approved for NASAL specimens only), is one component of a comprehensive MRSA colonization surveillance program. It is not intended to diagnose MRSA infection nor to guide or monitor treatment for MRSA infections.          Radiology Studies: No results found.      Scheduled Meds: . amLODipine  5 mg Oral Daily  . enoxaparin (LOVENOX) injection  40 mg Subcutaneous Q24H  . folic acid  1 mg Oral Daily  . LORazepam  0-4 mg Intravenous Q6H   Followed by  . [START ON 04/26/2017] LORazepam  0-4 mg Intravenous Q12H  . multivitamin with minerals  1 tablet Oral Daily  . potassium chloride  20 mEq Oral Once  . prochlorperazine  10 mg Intravenous Once  . thiamine  100 mg Oral Daily   Or  . thiamine  100 mg Intravenous Daily    Continuous Infusions: . sodium chloride 125 mL/hr at 04/24/17 0540  . acetylcysteine 15 mg/kg/hr (04/24/17 0600)     LOS: 0 days    No charge.   Time spent: 40 mins    Paislee Szatkowski, MD [facility] Pager [phone]  If 7PM-7AM, please contact night-coverage [email or web address] Password [email or web address] 04/24/2017, 9:00 AM

## 2017-04-24 NOTE — Progress Notes (Signed)
On arrival to SD unit patient was hypertensive with SBP in the 180s. [facility] provider, Pearson GrippeJames Kim, notified. 10 mg hydralazine ordered PRN and given. Per phone call with poison control they stated that it could also be managed with Ativan. Pt is already on CIWA scoring a 10 so patient was also given 1 mg Ativan with the hydralazine. Will continue to assess and monitor.

## 2017-04-24 NOTE — Progress Notes (Signed)
MEDICATION RELATED CONSULT NOTE  Pharmacy Consult for Acetadote Indication: Apap overdose  Allergies  Allergen Reactions  . Bactrim [Sulfamethoxazole-Trimethoprim] Nausea And Vomiting and Other (See Comments)    high fevers.   . Ibuprofen Itching  . Toradol [Ketorolac Tromethamine] Itching    Patient Measurements: Height: 5\' 4"  (162.6 cm) Weight: 180 lb (81.6 kg) IBW/kg (Calculated) : 54.7   Vital Signs: Temp: 98.8 F (37.1 C) (08/12 0802) Temp Source: Oral (08/11 2359) BP: 147/99 (08/12 1104) Pulse Rate: 105 (08/12 1104) Intake/Output from previous day: 08/11 0701 - 08/12 0700 In: 477.7 [I.V.:477.7] Out: -  Intake/Output from this shift: Total I/O In: 120 [P.O.:120] Out: 200 [Urine:200]  Labs:  Recent Labs  04/23/17 2350 04/24/17 0715  WBC 6.4 4.7  HGB 13.1 12.1  HCT 40.4 36.4  PLT 362 318  APTT  --  28  CREATININE 0.72 0.59  ALBUMIN 4.7 4.0  PROT 8.4* 7.5  AST 18 18  ALT 17 16  ALKPHOS 79 60  BILITOT 0.3 0.7   Estimated Creatinine Clearance: 107.3 mL/min (by C-G formula based on SCr of 0.59 mg/dL).   Microbiology: Recent Results (from the past 720 hour(s))  MRSA PCR Screening     Status: None   Collection Time: 04/24/17  5:31 AM  Result Value Ref Range Status   MRSA by PCR NEGATIVE NEGATIVE Final    Comment:        The GeneXpert MRSA Assay (FDA approved for NASAL specimens only), is one component of a comprehensive MRSA colonization surveillance program. It is not intended to diagnose MRSA infection nor to guide or monitor treatment for MRSA infections.     Medical History: Past Medical History:  Diagnosis Date  . Anxiety   . Bronchitis   . Headache    migraines  . PIH (pregnancy induced hypertension)   . Pregnancy induced hypertension   . Pyelonephritis   . Seasonal allergies   . Seizure (HCC)    pseudoseizures; stress related   Assessment: [age] yoF reports ingestion of #10 Tylenol PM ~1500 - timing of ingestion uncertain due  to pt poor historian.   0000 Apap = 130.  0715 APAP level 35 NAC started at 04 am. LFTS WNL, INR WNL.  No nausea reported. [facility]  Goal of Therapy:  Prevent liver toxicity  Plan:  Per PC recs: continue NAC infusion, repeat LFTs, Apap level and INR in am and f/u w/ PC for plans  Herby AbrahamMichelle T. Letetia Romanello, Pharm.D. [phone] 04/24/2017 11:46 AM

## 2017-04-24 NOTE — H&P (Signed)
History and Physical    [name]:[id number] DOB: November 22, 1985 DOA: 04/23/2017  Referring MD/NP/PA:   PCP: Billee Cashing, MD   Patient coming from:  The patient is coming from home.  At baseline, pt is independent for most of ADL.  Chief Complaint: overdose on tylenol  HPI: Kristi Marshall is a 31 y.o. female with medical history significant of anxiety, headaches, pseudoseizure, who presents with Tylenol overdose.  Patient reports that she got in an argument with her baby's father. She took many pills of Tylenol. Initially it was reported that this was acetaminophen-Benadryl; however, patient cannot provide significant details. She told the nurse that she ingested 10 tablets at 3 PM. When I asked her, she said she is not very sure about the time and numbers of pill. She denies other ingestion. She does report that she was trying to hurt herself. She also drank alcohol. Currently patient is asymptomatic. She is oriented 3. Denies chest pain, shortness of breath, cough, fever, chills, nausea, vomiting, diarrhea, abdominal pain, symptoms of UTI or unilateral weakness.  ED Course: pt was found to have Tylenol level was 30, alcohol level 120, negative UDS, negative pregnancy test, salicylate level less than 10, potassium 3.4, WBC 6.4, LFT normal, temperature normal, tachycardia, tachypnea, O2 sat 97% on room air. P;t is admitted to stepdown as inpatient. NAC gtt was started.    Review of Systems:   General: no fevers, chills, no body weight gain, has fatigue HEENT: no blurry vision, hearing changes or sore throat Respiratory: no dyspnea, coughing, wheezing CV: no chest pain, no palpitations GI: no nausea, vomiting, abdominal pain, diarrhea, constipation GU: no dysuria, burning on urination, increased urinary frequency, hematuria  Ext: no leg edema Neuro: no unilateral weakness, numbness, or tingling, no vision change or hearing loss Skin: no rash, no skin tear. MSK: No  muscle spasm, no deformity, no limitation of range of movement in spin Heme: No easy bruising.  Travel history: No recent long distant travel.  Allergy:  Allergies  Allergen Reactions  . Bactrim [Sulfamethoxazole-Trimethoprim] Nausea And Vomiting and Other (See Comments)    high fevers.   . Ibuprofen Itching  . Toradol [Ketorolac Tromethamine] Itching    Past Medical History:  Diagnosis Date  . Anxiety   . Bronchitis   . Headache    migraines  . PIH (pregnancy induced hypertension)   . Pregnancy induced hypertension   . Pyelonephritis   . Seasonal allergies   . Seizure [facility])    pseudoseizures; stress related    Past Surgical History:  Procedure Laterality Date  . abortions    . DILATION AND CURETTAGE OF UTERUS     ? retained POC     Social History:  reports that she quit smoking about 11 months ago. Her smoking use included Cigarettes. She quit after 7.00 years of use. She has quit using smokeless tobacco. She reports that she drinks alcohol. She reports that she does not use drugs.  Family History:  Family History  Problem Relation Age of Onset  . Hypertension Mother   . Diabetes Maternal Aunt   . Hypertension Maternal Grandmother   . Diabetes Maternal Grandmother   . Heart disease Maternal Grandmother   . Hypertension Maternal Grandfather   . Heart disease Maternal Grandfather   . Anesthesia problems Neg Hx   . Hypotension Neg Hx   . Malignant hyperthermia Neg Hx   . Pseudochol deficiency Neg Hx   . Other Neg Hx   . Hearing  loss Neg Hx      Prior to Admission medications   Medication Sig Start Date End Date Taking? Authorizing Provider  acetaminophen (TYLENOL) 500 MG tablet Take 500 mg by mouth every 6 (six) hours as needed for mild pain or headache.   Yes [provider]  albuterol (PROVENTIL HFA;VENTOLIN HFA) 108 (90 Base) MCG/ACT inhaler Inhale 2 puffs into the lungs every 6 (six) hours as needed for wheezing or shortness of breath.   Yes  [provider]  Oxycodone HCl 10 MG TABS Take 10 mg by mouth 3 (three) times daily after meals.  12/01/16  Yes [provider]  docusate sodium (COLACE) 100 MG capsule Take 1 capsule (100 mg total) by mouth 2 (two) times daily. Patient not taking: Reported on 04/24/2017 01/16/17   Michaele Offer, DO    Physical Exam: Vitals:   04/24/17 0345 04/24/17 0400 04/24/17 0415 04/24/17 0430  BP: 134/88 127/89 (!) 135/95 (!) 150/96  Pulse: 81 78 74 80  Resp: 19 18 (!) 21 20  Temp:      TempSrc:      SpO2: 100% 98% 99% 100%  Weight:      Height:       General: Not in acute distress HEENT:       Eyes: PERRL, EOMI, no scleral icterus.       ENT: No discharge from the ears and nose, no pharynx injection, no tonsillar enlargement.        Neck: No JVD, no bruit, no mass felt. Heme: No neck lymph node enlargement. Cardiac: S1/S2, RRR, No murmurs, No gallops or rubs. Respiratory:  No rales, wheezing, rhonchi or rubs. GI: Soft, nondistended, nontender, no rebound pain, no organomegaly, BS present. GU: No hematuria Ext: No pitting leg edema bilaterally. 2+DP/PT pulse bilaterally. Musculoskeletal: No joint deformities, No joint redness or warmth, no limitation of ROM in spin. Skin: No rashes.  Neuro: Alert, oriented X3, cranial nerves II-XII grossly intact, moves all extremities normally.  Psych: Patient is not psychotic, currentl no suicidal or hemocidal ideation.  Labs on Admission: I have personally reviewed following labs and imaging studies  CBC:  Recent Labs Lab 04/23/17 2350  WBC 6.4  NEUTROABS 2.4  HGB 13.1  HCT 40.4  MCV 85.8  PLT 362   Basic Metabolic Panel:  Recent Labs Lab 04/23/17 2350  NA 146*  K 3.4*  CL 110  CO2 26  GLUCOSE 88  BUN 9  CREATININE 0.72  CALCIUM 9.4   GFR: Estimated Creatinine Clearance: 107.3 mL/min (by C-G formula based on SCr of 0.72 mg/dL). Liver Function Tests:  Recent Labs Lab 04/23/17 2350  AST 18  ALT 17    ALKPHOS 79  BILITOT 0.3  PROT 8.4*  ALBUMIN 4.7   No results for input(s): LIPASE, AMYLASE in the last 168 hours. No results for input(s): AMMONIA in the last 168 hours. Coagulation Profile: No results for input(s): INR, PROTIME in the last 168 hours. Cardiac Enzymes: No results for input(s): CKTOTAL, CKMB, CKMBINDEX, TROPONINI in the last 168 hours. BNP (last 3 results) No results for input(s): PROBNP in the last 8760 hours. HbA1C: No results for input(s): HGBA1C in the last 72 hours. CBG: No results for input(s): GLUCAP in the last 168 hours. Lipid Profile: No results for input(s): CHOL, HDL, LDLCALC, TRIG, CHOLHDL, LDLDIRECT in the last 72 hours. Thyroid Function Tests: No results for input(s): TSH, T4TOTAL, FREET4, T3FREE, THYROIDAB in the last 72 hours. Anemia Panel: No results for  input(s): VITAMINB12, FOLATE, FERRITIN, TIBC, IRON, RETICCTPCT in the last 72 hours. Urine analysis:    Component Value Date/Time   COLORURINE YELLOW 12/26/2016 2320   APPEARANCEUR CLEAR 12/26/2016 2320   LABSPEC 1.020 12/26/2016 2320   PHURINE 7.0 12/26/2016 2320   GLUCOSEU NEGATIVE 12/26/2016 2320   HGBUR NEGATIVE 12/26/2016 2320   BILIRUBINUR NEGATIVE 12/26/2016 2320   KETONESUR NEGATIVE 12/26/2016 2320   PROTEINUR NEGATIVE 12/26/2016 2320   UROBILINOGEN 0.2 01/06/2015 1515   NITRITE NEGATIVE 12/26/2016 2320   LEUKOCYTESUR NEGATIVE 12/26/2016 2320   Sepsis Labs: @LABRCNTIP (procalcitonin:4,lacticidven:4) )No results found for this or any previous visit (from the past 240 hour(s)).   Radiological Exams on Admission: No results found.   EKG: Independently reviewed.  Sinus rhythm, QTC 413, tachycardia, nonspecific T-wave change.   Assessment/Plan Principal Problem:   Overdose on Tylenol Active Problems:   Alcohol use   Hypokalemia   Overdose on Tylenol: tylenol level 130. LFT normal now. Poison Control was notified, advised to monitor for Seizures, QTC/QR prolongation,  urinary retention. IV fluids should be administered and benzodiazapine. No activated charcoal advised.  -Will admit to sdu -Started on  N-acetylcysteine per pharm -Seizure precaution -Continue neuro checks, monitor for worsening abdominal pain, nausea, vomiting, tinnitus, lays, pallor, lethargy or diaphoresis. -Check repeat Tylenol level -Check INR/Ptt -CMP in AM -Supportive care and IVF -Sitter at bedside -[facility]   Alcohol use: alcohol level 120. -start CIWA   Hypokalemia: K= 3.4 on admission. - Repleted  DVT ppx: SQ Lovenox Code Status: Full code Family Communication: None at bed side.    Disposition Plan:  Anticipate discharge back to previous home environment Consults called:  none Admission status: SDU/inpatient       Date of Service 04/24/2017    Lorretta HarpIU, Tarris Delbene [facility] Pager [phone]  If 7PM-7AM, please contact night-coverage [email or web address] Password [email or web address] 04/24/2017, 5:05 AM
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
## 2017-04-24 NOTE — Progress Notes (Signed)
MEDICATION RELATED CONSULT NOTE - INITIAL   Pharmacy Consult for Acetadote Indication: Apap overdose  Allergies  Allergen Reactions  . Bactrim [Sulfamethoxazole-Trimethoprim] Nausea And Vomiting and Other (See Comments)    high fevers.   . Ibuprofen Itching  . Toradol [Ketorolac Tromethamine] Itching    Patient Measurements: Height: 5\' 4"  (162.6 cm) Weight: 180 lb (81.6 kg) IBW/kg (Calculated) : 54.7   Vital Signs: Temp: 98.7 F (37.1 C) (08/11 2359) Temp Source: Oral (08/11 2359) BP: 126/86 (08/12 0145) Pulse Rate: 91 (08/12 0145) Intake/Output from previous day: No intake/output data recorded. Intake/Output from this shift: No intake/output data recorded.  Labs:  Recent Labs  04/23/17 2350  WBC 6.4  HGB 13.1  HCT 40.4  PLT 362  CREATININE 0.72  ALBUMIN 4.7  PROT 8.4*  AST 18  ALT 17  ALKPHOS 79  BILITOT 0.3   Estimated Creatinine Clearance: 107.3 mL/min (by C-G formula based on SCr of 0.72 mg/dL).   Microbiology: No results found for this or any previous visit (from the past 720 hour(s)).  Medical History: Past Medical History:  Diagnosis Date  . Anxiety   . Bronchitis   . Headache    migraines  . PIH (pregnancy induced hypertension)   . Pregnancy induced hypertension   . Pyelonephritis   . Seasonal allergies   . Seizure (HCC)    pseudoseizures; stress related    Medications:  Scheduled:  . acetylcysteine  150 mg/kg Intravenous Once   Infusions:  . acetylcysteine      Assessment: 30 yoF reports injestion of #10 Tylenol PM ~1500.  0000 Apap = 130.    Goal of Therapy:  Prevent liver toxicity  Plan:  Acetadote 150 mg/kg x1 bolus over 1 hour then drip at 15 mg/kg/hr [facility].   Susanne GreenhouseGreen, Teah Votaw R 04/24/2017,2:34 AM

## 2017-04-24 NOTE — ED Notes (Signed)
[facility] notified of ingestion and advised to monitor for Seizures, QTC/QR prolongation, urinary retention. IV fluids should be administered and benzodiazapine.  6hr monitor with cardiac monitoring. EKG at 6hrs and able to urinate. No activated charcoal advised. 4hr post ingestion Tylenol level. If no change or worsening in symptoms admission may be required.

## 2017-04-24 NOTE — ED Triage Notes (Signed)
Dr.Horton at bedside at this time and pt giving conflicting story on time of ingestion and states it may have been liquid form and not pills.

## 2017-04-25 ENCOUNTER — Inpatient Hospital Stay (HOSPITAL_COMMUNITY): Payer: Medicaid Other

## 2017-04-25 DIAGNOSIS — R0602 Shortness of breath: Secondary | ICD-10-CM | POA: Diagnosis present

## 2017-04-25 DIAGNOSIS — R000 Tachycardia, unspecified: Secondary | ICD-10-CM | POA: Diagnosis present

## 2017-04-25 LAB — CBC
HCT: 34.7 % — ABNORMAL LOW (ref 36.0–46.0)
Hemoglobin: 11.4 g/dL — ABNORMAL LOW (ref 12.0–15.0)
MCH: 27.4 pg (ref 26.0–34.0)
MCHC: 32.9 g/dL (ref 30.0–36.0)
MCV: 83.4 fL (ref 78.0–100.0)
PLATELETS: 281 10*3/uL (ref 150–400)
RBC: 4.16 MIL/uL (ref 3.87–5.11)
RDW: 16.6 % — ABNORMAL HIGH (ref 11.5–15.5)
WBC: 5.3 10*3/uL (ref 4.0–10.5)

## 2017-04-25 LAB — TROPONIN I
Troponin I: <0.03 ng/mL (ref <0.03)
Troponin I: <0.03 ng/mL (ref <0.03)

## 2017-04-25 LAB — COMPREHENSIVE METABOLIC PANEL
ALK PHOS: 57 U/L (ref 38–126)
ALT: 17 U/L (ref 14–54)
AST: 14 U/L — AB (ref 15–41)
Albumin: 3.3 g/dL — ABNORMAL LOW (ref 3.5–5.0)
Anion gap: 7 (ref 5–15)
BILIRUBIN TOTAL: 0.4 mg/dL (ref 0.3–1.2)
BUN: 10 mg/dL (ref 6–20)
CO2: 21 mmol/L — ABNORMAL LOW (ref 22–32)
CREATININE: 0.6 mg/dL (ref 0.44–1.00)
Calcium: 8.6 mg/dL — ABNORMAL LOW (ref 8.9–10.3)
Chloride: 113 mmol/L — ABNORMAL HIGH (ref 101–111)
Glucose, Bld: 107 mg/dL — ABNORMAL HIGH (ref 65–99)
Potassium: 3.7 mmol/L (ref 3.5–5.1)
Sodium: 141 mmol/L (ref 135–145)
Total Protein: 6.4 g/dL — ABNORMAL LOW (ref 6.5–8.1)

## 2017-04-25 LAB — ACETAMINOPHEN LEVEL: Acetaminophen (Tylenol), Serum: <10 ug/mL — ABNORMAL LOW (ref 10–30)

## 2017-04-25 LAB — GLUCOSE, CAPILLARY: Glucose-Capillary: 105 mg/dL — ABNORMAL HIGH (ref 65–99)

## 2017-04-25 LAB — MAGNESIUM
MAGNESIUM: 1.8 mg/dL (ref 1.7–2.4)
Magnesium: 2.1 mg/dL (ref 1.7–2.4)

## 2017-04-25 LAB — PROTIME-INR
INR: 1.12
PROTHROMBIN TIME: 14.5 s (ref 11.4–15.2)

## 2017-04-25 LAB — TSH: TSH: 0.403 u[IU]/mL (ref 0.350–4.500)

## 2017-04-25 MED ORDER — ALBUTEROL SULFATE (2.5 MG/3ML) 0.083% IN NEBU: 3.0000 mL | INHALATION_SOLUTION | Freq: Four times a day (QID) | RESPIRATORY_TRACT | Status: DC | PRN

## 2017-04-25 MED ORDER — OXYCODONE HCL 5 MG PO TABS
10.0000 mg | ORAL_TABLET | Freq: Once | ORAL | Status: AC
  Administered 2017-04-25: 10 mg via ORAL
  Filled 2017-04-25: qty 2

## 2017-04-25 MED ORDER — OXYCODONE HCL 5 MG PO TABS
10.0000 mg | ORAL_TABLET | Freq: Three times a day (TID) | ORAL | Status: DC
  Administered 2017-04-25 – 2017-04-26 (×6): 10 mg via ORAL
  Filled 2017-04-25 (×6): qty 2

## 2017-04-25 MED ORDER — SENNOSIDES-DOCUSATE SODIUM 8.6-50 MG PO TABS
1.0000 | ORAL_TABLET | Freq: Two times a day (BID) | ORAL | Status: DC
  Administered 2017-04-25 – 2017-04-26 (×3): 1 via ORAL
  Filled 2017-04-25 (×3): qty 1

## 2017-04-25 MED ORDER — DEXTROSE 5 % IV SOLN
15.0000 mg/kg/h | INTRAVENOUS | Status: DC
  Administered 2017-04-25: 15 mg/kg/h via INTRAVENOUS
  Filled 2017-04-25: qty 100

## 2017-04-25 MED ORDER — NICOTINE 21 MG/24HR TD PT24
21.0000 mg | MEDICATED_PATCH | Freq: Every day | TRANSDERMAL | Status: DC
  Administered 2017-04-25 – 2017-04-26 (×2): 21 mg via TRANSDERMAL
  Filled 2017-04-25 (×2): qty 1

## 2017-04-25 NOTE — Progress Notes (Signed)
PROGRESS NOTE    Kristi CromeBrittany S Marshall  ZOX:[id number] DOB: 1986/07/07 DOA: 04/23/2017 PCP: Billee CashingMcKenzie, [place], MD   Brief Narrative:  Patient is a 31 year old female history of anxiety, headaches, pseudoseizure presented with Tylenol overdose. Patient placed in stepdown unit. Patient placed on NAC. Psych consulted on patient and feel patient does not meet inpatient criteria. Outpatient counseling.   Assessment & Plan:   Principal Problem:   Overdose on Tylenol Active Problems:   TOBACCO USER   Seizure-like activity (HCC)   Alcohol use   Hypokalemia   Headache   HTN (hypertension)  #1 overdose on Tylenol Patient states she took a bunch of Tylenol pills as she was upset. Concern for suicidal ideation. Tylenol level on admission was elevated at 120. Salicylate level is less than 7. UDS negative. Tylenol level decreasing and now < 10. LFTs within normal limits. INR at 1.09. EKG with a QTc of 436. Saline lock IV fluids. Continue supportive care. NAC has been discontinued this morning 04/25/2017. Patient has been seen in consultation by psychiatry who has cleared patient and feel patient does not meet criteria for inpatient psychiatric admission and would benefit from outpatient counseling. Social worker consultation pending for outpatient referral for counseling. Appreciate psychiatric input and recommendations.  #2 hypertension Patient noted to have elevated blood pressures. Patient states has a history of hypertension and takes amlodipine but not sure the dose. Norvasc dose has been increased to 10 mg daily. Patient noting to have significantly elevated blood pressures that improve times would Ativan as well as hydralazine. Patient also noted to be on oxycodone for chronic pain which we will resume and see if hypotension improved with resumption of home pain regimen.  #3 alcohol use Continue the Ativan withdrawal protocol.  #4 headache Compazine 10 mg IV 1. Ibuprofen as needed.   #5  hypokalemia  Repleted.  #6 tobacco abuse Tobacco cessation. Patient states was 5-6 cigarettes a day depending on situation. Patient does not want a nicotine patch at this time.  #7 history of seizure-like activity/pseudoseizure Not on any medications. Monitor closely.  #8 tachycardia Patient noted to have a tachycardia with some complaints of shortness of breath. Saline lock IV fluids. Check a chest x-ray. Check a EKG. Cycle cardiac enzymes every 6 hours 3. Check a TSH. Check a 2-D echo. Resume home regimen of oxycodone. Ativan 1.   DVT prophylaxis: Lovenox Code Status: Full Family Communication: Updated patient. No family at bedside. Disposition Plan: Remain in step down unit today. If hypertension improves with transfer to telemetry tomorrow.   Consultants:   Psychiatry: Dr. Jannifer FranklinAkintayo 04/24/2017  Procedures:   None  Antimicrobials:   None   Subjective: Patient complaining of chronic back pain, SOB, palpitations. No CP. Patient denies any abdominal pain. No seizures noted.  Objective: Vitals:   04/25/17 0735 04/25/17 0740 04/25/17 0743 04/25/17 0800  BP: (!) 180/109 (!) 170/100  (!) 155/93  Pulse: 66 65  (!) 102  Resp: 19 19  16   Temp:    99.4 F (37.4 C)  TempSrc:    Oral  SpO2: 100% 100%  100%  Weight:   75.5 kg (166 lb 7.2 oz)   Height:        Intake/Output Summary (Last 24 hours) at 04/25/17 0848 Last data filed at 04/25/17 0806  Gross per 24 hour  Intake           5319.7 ml  Output             2300 ml  Net           3019.7 ml   Filed Weights   04/24/17 0000 04/25/17 0743  Weight: 81.6 kg (180 lb) 75.5 kg (166 lb 7.2 oz)    Examination:  General exam: Appears calm and comfortable  Respiratory system: Clear to auscultation. Respiratory effort normal. Cardiovascular system: S1 & S2 heard,Tachycardia . No JVD, murmurs, rubs, gallops or clicks. No pedal edema. Gastrointestinal system: Abdomen is nondistended, soft and nontender. No organomegaly or  masses felt. Normal bowel sounds heard. Central nervous system: Alert and oriented. No focal neurological deficits. Extremities: Symmetric 5 x 5 power. Skin: No rashes, lesions or ulcers Psychiatry: Judgement and insight appear normal. Mood & affect appropriate.     Data Reviewed: I have personally reviewed following labs and imaging studies  CBC:  Recent Labs Lab 04/23/17 2350 04/24/17 0715 04/25/17 0358  WBC 6.4 4.7 5.3  NEUTROABS 2.4  --   --   HGB 13.1 12.1 11.4*  HCT 40.4 36.4 34.7*  MCV 85.8 85.2 83.4  PLT 362 318 281   Basic Metabolic Panel:  Recent Labs Lab 04/23/17 2350 04/24/17 0715 04/25/17 0358  NA 146* 141 141  K 3.4* 3.8 3.7  CL 110 108 113*  CO2 26 20* 21*  GLUCOSE 88 101* 107*  BUN 9 7 10   CREATININE 0.72 0.59 0.60  CALCIUM 9.4 8.4* 8.6*  MG  --   --  1.8   GFR: Estimated Creatinine Clearance: 103.2 mL/min (by C-G formula based on SCr of 0.6 mg/dL). Liver Function Tests:  Recent Labs Lab 04/23/17 2350 04/24/17 0715 04/25/17 0358  AST 18 18 14*  ALT 17 16 17   ALKPHOS 79 60 57  BILITOT 0.3 0.7 0.4  PROT 8.4* 7.5 6.4*  ALBUMIN 4.7 4.0 3.3*   No results for input(s): LIPASE, AMYLASE in the last 168 hours. No results for input(s): AMMONIA in the last 168 hours. Coagulation Profile:  Recent Labs Lab 04/24/17 0715 04/25/17 0358  INR 1.09 1.12   Cardiac Enzymes: No results for input(s): CKTOTAL, CKMB, CKMBINDEX, TROPONINI in the last 168 hours. BNP (last 3 results) No results for input(s): PROBNP in the last 8760 hours. HbA1C: No results for input(s): HGBA1C in the last 72 hours. CBG:  Recent Labs Lab 04/24/17 0800 04/25/17 0719  GLUCAP 112* 105*   Lipid Profile: No results for input(s): CHOL, HDL, LDLCALC, TRIG, CHOLHDL, LDLDIRECT in the last 72 hours. Thyroid Function Tests: No results for input(s): TSH, T4TOTAL, FREET4, T3FREE, THYROIDAB in the last 72 hours. Anemia Panel: No results for input(s): VITAMINB12, FOLATE,  FERRITIN, TIBC, IRON, RETICCTPCT in the last 72 hours. Sepsis Labs: No results for input(s): PROCALCITON, LATICACIDVEN in the last 168 hours.  Recent Results (from the past 240 hour(s))  MRSA PCR Screening     Status: None   Collection Time: 04/24/17  5:31 AM  Result Value Ref Range Status   MRSA by PCR NEGATIVE NEGATIVE Final    Comment:        The GeneXpert MRSA Assay (FDA approved for NASAL specimens only), is one component of a comprehensive MRSA colonization surveillance program. It is not intended to diagnose MRSA infection nor to guide or monitor treatment for MRSA infections.          Radiology Studies: No results found.      Scheduled Meds: . amLODipine  10 mg Oral Daily  . enoxaparin (LOVENOX) injection  40 mg Subcutaneous Q24H  . folic acid  1 mg Oral  Daily  . LORazepam  0-4 mg Intravenous Q6H   Followed by  . [START ON 04/26/2017] LORazepam  0-4 mg Intravenous Q12H  . multivitamin with minerals  1 tablet Oral Daily  . Oxycodone HCl  10 mg Oral TID PC  . potassium chloride  20 mEq Oral Once  . senna-docusate  1 tablet Oral BID  . thiamine  100 mg Oral Daily  . traZODone  50 mg Oral QHS   Continuous Infusions:    LOS: 1 day     Time spent: 40 mins    THOMPSON,DANIEL, MD [facility] Pager [phone]  If 7PM-7AM, please contact night-coverage [email or web address] Password [facility] 04/25/2017, 8:48 AM
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
## 2017-04-25 NOTE — Care Management Note (Signed)
Case Management Note  Patient Details  Name: Kristi Marshall MRN: [id number] Date of Birth: 09/22/1985  Subjective/Objective:      Overdose on tylenol              Action/Plan: Date:  April 25, 2017 Chart reviewed for concurrent status and case management needs. Will continue to follow patient progress. Discharge Planning: following for needs Expected discharge date: [id number] Kristi SmilingRhonda Taiyana Marshall, BSN, [place]3, [place]   [phone] Expected Discharge Date:                  Expected Discharge Plan:  Home/Self Care  In-House Referral:  NA  Discharge planning Services  CM Consult  Post Acute Care Choice:  NA Choice offered to:     DME Arranged:  N/A DME Agency:  NA  HH Arranged:  NA HH Agency:  NA  Status of Service:  In process, will continue to follow  If discussed at Long Length of Stay Meetings, dates discussed:    Additional Comments:  Kristi Marshall, Kristi Bera Lynn, RN 04/25/2017, 8:33 AM

## 2017-04-26 ENCOUNTER — Inpatient Hospital Stay (HOSPITAL_COMMUNITY): Payer: Medicaid Other

## 2017-04-26 DIAGNOSIS — Z630 Problems in relationship with spouse or partner: Secondary | ICD-10-CM

## 2017-04-26 DIAGNOSIS — Z87891 Personal history of nicotine dependence: Secondary | ICD-10-CM

## 2017-04-26 DIAGNOSIS — R51 Headache: Secondary | ICD-10-CM

## 2017-04-26 DIAGNOSIS — E786 Lipoprotein deficiency: Secondary | ICD-10-CM

## 2017-04-26 DIAGNOSIS — R569 Unspecified convulsions: Secondary | ICD-10-CM

## 2017-04-26 DIAGNOSIS — R0602 Shortness of breath: Secondary | ICD-10-CM

## 2017-04-26 DIAGNOSIS — F4329 Adjustment disorder with other symptoms: Secondary | ICD-10-CM | POA: Diagnosis present

## 2017-04-26 DIAGNOSIS — R000 Tachycardia, unspecified: Secondary | ICD-10-CM

## 2017-04-26 DIAGNOSIS — T391X1A Poisoning by 4-Aminophenol derivatives, accidental (unintentional), initial encounter: Secondary | ICD-10-CM

## 2017-04-26 DIAGNOSIS — I361 Nonrheumatic tricuspid (valve) insufficiency: Secondary | ICD-10-CM

## 2017-04-26 DIAGNOSIS — F419 Anxiety disorder, unspecified: Secondary | ICD-10-CM

## 2017-04-26 DIAGNOSIS — Z789 Other specified health status: Secondary | ICD-10-CM

## 2017-04-26 LAB — CBC
HCT: 37.2 % (ref 36.0–46.0)
Hemoglobin: 12.2 g/dL (ref 12.0–15.0)
MCH: 27.8 pg (ref 26.0–34.0)
MCHC: 32.8 g/dL (ref 30.0–36.0)
MCV: 84.7 fL (ref 78.0–100.0)
PLATELETS: 288 10*3/uL (ref 150–400)
RBC: 4.39 MIL/uL (ref 3.87–5.11)
RDW: 16.5 % — ABNORMAL HIGH (ref 11.5–15.5)
WBC: 5.3 10*3/uL (ref 4.0–10.5)

## 2017-04-26 LAB — COMPREHENSIVE METABOLIC PANEL
ALBUMIN: 3.7 g/dL (ref 3.5–5.0)
ALK PHOS: 63 U/L (ref 38–126)
ALT: 16 U/L (ref 14–54)
ANION GAP: 6 (ref 5–15)
AST: 12 U/L — AB (ref 15–41)
BILIRUBIN TOTAL: 0.4 mg/dL (ref 0.3–1.2)
BUN: 11 mg/dL (ref 6–20)
CALCIUM: 9 mg/dL (ref 8.9–10.3)
CO2: 27 mmol/L (ref 22–32)
Chloride: 109 mmol/L (ref 101–111)
Creatinine, Ser: 0.73 mg/dL (ref 0.44–1.00)
GFR calc Af Amer: >60 mL/min (ref >60)
GFR calc non Af Amer: >60 mL/min (ref >60)
GLUCOSE: 97 mg/dL (ref 65–99)
Potassium: 3.4 mmol/L — ABNORMAL LOW (ref 3.5–5.1)
SODIUM: 142 mmol/L (ref 135–145)
Total Protein: 6.9 g/dL (ref 6.5–8.1)

## 2017-04-26 LAB — ECHOCARDIOGRAM COMPLETE
Height: 64 in
WEIGHTICAEL: 2663.16 [oz_av]

## 2017-04-26 LAB — GLUCOSE, CAPILLARY: Glucose-Capillary: 94 mg/dL (ref 65–99)

## 2017-04-26 LAB — D-DIMER, QUANTITATIVE: D-Dimer, Quant: <0.27 ug/mL-FEU (ref 0.00–0.50)

## 2017-04-26 LAB — MAGNESIUM: MAGNESIUM: 2 mg/dL (ref 1.7–2.4)

## 2017-04-26 MED ORDER — TRAZODONE HCL 50 MG PO TABS: 50.0000 mg | ORAL_TABLET | Freq: Every day | ORAL | 0 refills | Status: DC

## 2017-04-26 MED ORDER — POTASSIUM CHLORIDE CRYS ER 20 MEQ PO TBCR
40.0000 meq | EXTENDED_RELEASE_TABLET | Freq: Once | ORAL | Status: AC
  Administered 2017-04-26: 40 meq via ORAL
  Filled 2017-04-26: qty 2

## 2017-04-26 MED ORDER — FOLIC ACID 1 MG PO TABS: 1.0000 mg | ORAL_TABLET | Freq: Every day | ORAL | Status: DC

## 2017-04-26 MED ORDER — NICOTINE 21 MG/24HR TD PT24: 21.0000 mg | MEDICATED_PATCH | Freq: Every day | TRANSDERMAL | 0 refills | Status: DC

## 2017-04-26 MED ORDER — SERTRALINE HCL 25 MG PO TABS: 25.0000 mg | ORAL_TABLET | Freq: Every day | ORAL | 0 refills | Status: DC

## 2017-04-26 MED ORDER — ADULT MULTIVITAMIN W/MINERALS CH: 1.0000 | ORAL_TABLET | Freq: Every day | ORAL | Status: DC

## 2017-04-26 MED ORDER — THIAMINE HCL 100 MG PO TABS: 100.0000 mg | ORAL_TABLET | Freq: Every day | ORAL | Status: DC

## 2017-04-26 MED ORDER — SENNOSIDES-DOCUSATE SODIUM 8.6-50 MG PO TABS: 1.0000 | ORAL_TABLET | Freq: Two times a day (BID) | ORAL | Status: DC

## 2017-04-26 MED ORDER — SERTRALINE HCL 50 MG PO TABS
25.0000 mg | ORAL_TABLET | Freq: Every day | ORAL | Status: DC
  Administered 2017-04-26: 25 mg via ORAL
  Filled 2017-04-26: qty 1

## 2017-04-26 MED ORDER — METOPROLOL TARTRATE 25 MG PO TABS: 25.0000 mg | ORAL_TABLET | Freq: Two times a day (BID) | ORAL | 0 refills | Status: DC

## 2017-04-26 MED ORDER — METOPROLOL TARTRATE 25 MG PO TABS
25.0000 mg | ORAL_TABLET | Freq: Two times a day (BID) | ORAL | Status: DC
Start: 2017-04-26 — End: 2017-04-26
  Administered 2017-04-26: 25 mg via ORAL
  Filled 2017-04-26: qty 1

## 2017-04-26 NOTE — Progress Notes (Signed)
  Echocardiogram 2D Echocardiogram has been performed.  Leta JunglingCooper, Demonie Kassa M 04/26/2017, 8:33 AM

## 2017-04-26 NOTE — Progress Notes (Signed)
Patient verbalized understanding of discharge instructions. Patient was given prescriptions and AVS form. Patient is stable at discharge. Patient's daughter is at the bedside.

## 2017-04-26 NOTE — Discharge Summary (Addendum)
Physician Discharge Summary  Kristi Marshall WUJ:[id number] DOB: 1985/11/12 DOA: 04/23/2017  PCP: Kristi Cashing, MD  Admit date: 04/23/2017 Discharge date: 04/26/2017  Time spent: 60 minutes  Recommendations for Outpatient Follow-up:  1. Follow-up with Kristi Cashing, MD in 2 weeks. On follow-up patient's blood pressure and tachycardia need to be reassessed. Patient will need a basic metabolic profile done to follow-up on election lites and renal function. 2. Follow[facility]up with [facility] [place] for outpatient therapy.   Discharge Diagnoses:  Principal Problem:   Overdose on Tylenol Active Problems:   TOBACCO USER   Seizure-like activity (HCC)   Alcohol use   Hypokalemia   Headache   HTN (hypertension)   SOB (shortness of breath)   Tachycardia   Adjustment disorder with emotional disturbance   Discharge Condition: stable and improved  Diet recommendation: regular  Filed Weights   04/24/17 0000 04/25/17 0743 04/26/17 1000  Weight: 81.6 kg (180 lb) 75.5 kg (166 lb 7.2 oz) 75.1 kg (165 lb 9.1 oz)    History of present illness:  Per Dr Kristi Marshall  Kristi Marshall is a 31 y.o. female with medical history significant of anxiety, headaches, pseudoseizure, who presented with Tylenol overdose.  Patient reported that she got in an argument with her baby's father. She took many pills of Tylenol. Initially it was reported that this was acetaminophen-Benadryl; however, patient cannot provide significant details. She told the nurse that she ingested 10 tablets at 3 PM. When asked her, she said she is not very sure about the time and numbers of pill. She denies other ingestion. She does report that she was trying to hurt herself. She also drank alcohol. Patient is asymptomatic. She was oriented 3. Denied chest pain, shortness of breath, cough, fever, chills, nausea, vomiting, diarrhea, abdominal pain, symptoms of UTI or unilateral weakness.  ED Course: pt was found to have  Tylenol level was 30, alcohol level 120, negative UDS, negative pregnancy test, salicylate level less than 10, potassium 3.4, WBC 6.4, LFT normal, temperature normal, tachycardia, tachypnea, O2 sat 97% on room air. P;t is admitted to stepdown as inpatient. NAC gtt was started.   Hospital Course:  #1 overdose on Tylenol Patient stated she took a bunch of Tylenol pills as she was upset. Concern for suicidal ideation. Tylenol level on admission was elevated at 120. Salicylate level is less than 7. UDS negative. Tylenol level decreased and now < 10. LFTs within remained normal limits. INR at 1.09. EKG with a QTc of 436. Saline lock IV fluids.  Patient was admitted maintained on supportive care as well as an acetylcysteine. Patient improved clinically. NAC has been discontinued on 04/25/2017. Patient has been seen in consultation by psychiatry who has cleared patient and feel patient does not meet criteria for inpatient psychiatric admission and would benefit from outpatient counseling. Social worker consultation ff for outpatient referral for counseling. Patient will be discharged in stable and improved condition.  #2 hypertension Patient noted to have elevated blood pressures. Patient states has a history of hypertension and takes amlodipine but not sure the dose. Patient noted to have significantly elevated blood pressures that improved at times with Ativan as well as hydralazine. Patient also noted to be on oxycodone for chronic pain which was resumed. Blood pressure improved however patient tachycardic. Patient was initially placed on Norvasc which was subsequently changed to Lopressor 25 mg twice daily as patient was noted to be tachycardic. Patient's blood pressure improved on this regimen. Outpatient follow-up with PCP.   #  3 alcohol use Patient was placed on the Ativan withdrawal protocol. Patient did not have any DVTs. Patient remained stable. Outpatient follow-up.   #4 headache  Ibuprofen as  needed.   #5 hypokalemia  Repleted.  #6 tobacco abuse Tobacco cessation. Patient placed on a nicotine patch.   #7 history of seizure-like activity/pseudoseizure Not on any medications. Outpatient follow-up.  #8 tachycardia Patient noted to have a tachycardia with some complaints of shortness of breath. Saline lock IV fluids. Chest x-ray with no acute abnormalities. Cardiac enzymes negative 3. TSH is 0.403. EKG with a heart rate of 122, T-wave inversion in leads 2,3, aVF with normal QTC.2-D echo which was done had a EF of 55-60% with no wall motion abnormalities. Patient was resumed on home regimen of oxycodone with some improvement however was still tachycardic. D-dimer obtained was negative. Patient placed on Lopressor 25 mg twice daily with improvement in tachycardia. Outpatient follow-up with PCP.  #9 adjustment disorder with emotional disturbance Patient was seen by psychiatry due to a Tylenol overdose. It was felt patient did have an adjustment disorder with emotional disturbance. Psychiatry recommended continue Ativan for alcohol detox. Ambien was discontinued and patient placed on trazodone 50 mg daily at bedtime at bedtime for aggression/insomnia. Zoloft was also added to patient's regimen. Outpatient follow-up.   Procedures:  2-D echo 04/26/2017  Chest x-ray 04/25/2017   Consultations:  Psychiatry: Kristi Marshall 04/24/2017  Discharge Exam: Vitals:   04/26/17 1700 04/26/17 1800  BP: (!) 137/97 (!) 147/107  Pulse: 88 (!) 105  Resp: 17 19  Temp:    SpO2: 95% 95%    General: NAD Cardiovascular: Tachycardia Respiratory: CTAB  Discharge Instructions   Discharge Instructions    Diet general    Complete by:  As directed    Increase activity slowly    Complete by:  As directed      Current Discharge Medication List    START taking these medications   Details  folic acid (FOLVITE) 1 MG tablet Take 1 tablet (1 mg total) by mouth daily.    metoprolol  tartrate (LOPRESSOR) 25 MG tablet Take 1 tablet (25 mg total) by mouth 2 (two) times daily. Qty: 60 tablet, Refills: 0    Multiple Vitamin (MULTIVITAMIN WITH MINERALS) TABS tablet Take 1 tablet by mouth daily.    nicotine (NICODERM CQ - DOSED IN MG/24 HOURS) 21 mg/24hr patch Place 1 patch (21 mg total) onto the skin daily. Qty: 28 patch, Refills: 0    senna-docusate (SENOKOT-S) 8.6-50 MG tablet Take 1 tablet by mouth 2 (two) times daily.    sertraline (ZOLOFT) 25 MG tablet Take 1 tablet (25 mg total) by mouth daily. Qty: 30 tablet, Refills: 0    thiamine 100 MG tablet Take 1 tablet (100 mg total) by mouth daily.    traZODone (DESYREL) 50 MG tablet Take 1 tablet (50 mg total) by mouth at bedtime. Qty: 30 tablet, Refills: 0      CONTINUE these medications which have NOT CHANGED   Details  acetaminophen (TYLENOL) 500 MG tablet Take 500 mg by mouth every 6 (six) hours as needed for mild pain or headache.    albuterol (PROVENTIL HFA;VENTOLIN HFA) 108 (90 Base) MCG/ACT inhaler Inhale 2 puffs into the lungs every 6 (six) hours as needed for wheezing or shortness of breath.    Oxycodone HCl 10 MG TABS Take 10 mg by mouth 3 (three) times daily after meals.  Refills: 0      STOP taking these medications  docusate sodium (COLACE) 100 MG capsule        Allergies  Allergen Reactions  . Bactrim [Sulfamethoxazole-Trimethoprim] Nausea And Vomiting and Other (See Comments)    high fevers.   . Ibuprofen Itching  . Toradol [Ketorolac Tromethamine] Itching   Follow-up Information    [facility] [employer]. Call on 04/25/2017.   Specialty:  Professional Counselor Why:  Call and make appointment for outpatient therapy if interested in continuing therapy Contact information: [facility] [place] [address]  [place] [address] [phone]        Kristi Cashing, MD. Schedule an appointment as soon as possible for a visit in 2 week(s).    Specialty:  Family Medicine Why:  f/u in 2 weeks. Contact information: [address] [place] [address] [phone]            The results of significant diagnostics from this hospitalization (including imaging, microbiology, ancillary and laboratory) are listed below for reference.    Significant Diagnostic Studies: Dg Chest 2 View  Result Date: 04/25/2017 CLINICAL DATA:  Tachycardia.  Shortness of breath EXAM: CHEST  2 VIEW COMPARISON:  02/22/2016 FINDINGS: Normal heart size and mediastinal contours. No acute infiltrate or edema. No effusion or pneumothorax. No osseous findings. IMPRESSION: Negative chest. Electronically Signed   By: Marnee Spring M.D.   On: 04/25/2017 09:36    Microbiology: Recent Results (from the past 240 hour(s))  MRSA PCR Screening     Status: None   Collection Time: 04/24/17  5:31 AM  Result Value Ref Range Status   MRSA by PCR NEGATIVE NEGATIVE Final    Comment:        The GeneXpert MRSA Assay (FDA approved for NASAL specimens only), is one component of a comprehensive MRSA colonization surveillance program. It is not intended to diagnose MRSA infection nor to guide or monitor treatment for MRSA infections.      Labs: Basic Metabolic Panel:  Recent Labs Lab 04/23/17 2350 04/24/17 0715 04/25/17 0358 04/25/17 1222 04/26/17 0258  NA 146* 141 141  --  142  K 3.4* 3.8 3.7  --  3.4*  CL 110 108 113*  --  109  CO2 26 20* 21*  --  27  GLUCOSE 88 101* 107*  --  97  BUN 9 7 10   --  11  CREATININE 0.72 0.59 0.60  --  0.73  CALCIUM 9.4 8.4* 8.6*  --  9.0  MG  --   --  1.8 2.1 2.0   Liver Function Tests:  Recent Labs Lab 04/23/17 2350 04/24/17 0715 04/25/17 0358 04/26/17 0258  AST 18 18 14* 12*  ALT 17 16 17 16   ALKPHOS 79 60 57 63  BILITOT 0.3 0.7 0.4 0.4  PROT 8.4* 7.5 6.4* 6.9  ALBUMIN 4.7 4.0 3.3* 3.7   No results for input(s): LIPASE, AMYLASE in the last 168 hours. No results for input(s): AMMONIA in the last  168 hours. CBC:  Recent Labs Lab 04/23/17 2350 04/24/17 0715 04/25/17 0358 04/26/17 0258  WBC 6.4 4.7 5.3 5.3  NEUTROABS 2.4  --   --   --   HGB 13.1 12.1 11.4* 12.2  HCT 40.4 36.4 34.7* 37.2  MCV 85.8 85.2 83.4 84.7  PLT 362 318 281 288   Cardiac Enzymes:  Recent Labs Lab 04/25/17 1222 04/25/17 1525 04/25/17 2118  TROPONINI <0.03 <0.03 <0.03   BNP: BNP (last 3 results) No results for input(s): BNP in the  last 8760 hours.  ProBNP (last 3 results) No results for input(s): PROBNP in the last 8760 hours.  CBG:  Recent Labs Lab 04/24/17 0800 04/25/17 0719 04/26/17 0757  GLUCAP 112* 105* 94       Signed:  THOMPSON,DANIEL MD.  [facility] 04/26/2017, 6:38 PM

## 2017-04-26 NOTE — Progress Notes (Signed)
PROGRESS NOTE    Kristi CromeBrittany S Barg  ZOX:[id number] DOB: [birth year] DOA: 04/23/2017 PCP: Billee CashingMcKenzie, [place], MD   Brief Narrative:  Patient is a 31 year old female history of anxiety, headaches, pseudoseizure presented with Tylenol overdose. Patient placed in stepdown unit. Patient placed on NAC. Psych consulted on patient and feel patient does not meet inpatient criteria. Outpatient counseling.   Assessment & Plan:   Principal Problem:   Overdose on Tylenol Active Problems:   TOBACCO USER   Seizure-like activity (HCC)   Alcohol use   Hypokalemia   Headache   HTN (hypertension)   SOB (shortness of breath)   Tachycardia  #1 overdose on Tylenol Patient states she took a bunch of Tylenol pills as she was upset. Concern for suicidal ideation. Tylenol level on admission was elevated at 120. Salicylate level is less than 7. UDS negative. Tylenol level decreasing and now < 10. LFTs within normal limits. INR at 1.09. EKG with a QTc of 436. Saline lock IV fluids. Continue supportive care. NAC has been discontinued on 04/25/2017. Patient has been seen in consultation by psychiatry who has cleared patient and feel patient does not meet criteria for inpatient psychiatric admission and would benefit from outpatient counseling. Social worker consultation ff for outpatient referral for counseling. Appreciate psychiatric input and recommendations.  #2 hypertension Patient noted to have elevated blood pressures. Patient states has a history of hypertension and takes amlodipine but not sure the dose. Patient noting to have significantly elevated blood pressures that improve at times with Ativan as well as hydralazine. Patient also noted to be on oxycodone for chronic pain which was resumed. Blood pressure improved however patient tachycardic. Will discontinue Norvasc and start patient on Lopressor 25 mg by mouth twice a day.   #3 alcohol use Continue the Ativan withdrawal protocol.  #4 headache  Ibuprofen as needed.   #5 hypokalemia  Replete.  #6 tobacco abuse Tobacco cessation. Continue nicotine patch.Patient states was 5-6 cigarettes a day depending on situation.   #7 history of seizure-like activity/pseudoseizure Not on any medications. Monitor closely.  #8 tachycardia Patient noted to have a tachycardia with some complaints of shortness of breath. Saline lock IV fluids. Chest x-ray with no acute abnormalities. Cardiac enzymes negative 3. TSH is 0.403. EKG with a heart rate of 122, T-wave inversion in leads 2,3, aVF with normal QTC.2-D echo pending. Resumed home regimen of oxycodone. Check a d-dimer as patient is 3 months postpartum. Place on Lopressor 25 mg by mouth twice a day.   DVT prophylaxis: Lovenox Code Status: Full Family Communication: Updated patient. No family at bedside. Disposition Plan: home when medically stable with improvement in tachycardia.   Consultants:   Psychiatry: Dr. Jannifer FranklinAkintayo 04/24/2017  Procedures:   2-D echo 04/26/2017  Chest x-ray 04/25/2017  Antimicrobials:   None   Subjective: Patient states feels better. Occasional SOB. No CP. No abdominal pain. No seizures. Tolerating current diet. Palpitations on ambulation.  Objective: Vitals:   04/26/17 0400 04/26/17 0600 04/26/17 0700 04/26/17 0800  BP: (!) 109/59 120/71 133/76   Pulse: 96 95 88   Resp: 18 19 16    Temp:    98.7 F (37.1 C)  TempSrc:    Oral  SpO2: 95% 95% 99%   Weight:      Height:        Intake/Output Summary (Last 24 hours) at 04/26/17 0944 Last data filed at 04/25/17 2300  Gross per 24 hour  Intake  480 ml  Output              700 ml  Net             -220 ml   Filed Weights   04/24/17 0000 04/25/17 0743  Weight: 81.6 kg (180 lb) 75.5 kg (166 lb 7.2 oz)    Examination:  General exam: Appears calm and comfortable  Respiratory system: Clear to auscultation. Respiratory effort normal. Cardiovascular system: S1 & S2 heard,Tachycardia . No  JVD, murmurs, rubs, gallops or clicks. No pedal edema. Gastrointestinal system: Abdomen is nondistended, soft and nontender. No organomegaly or masses felt. Normal bowel sounds heard. Central nervous system: Alert and oriented. No focal neurological deficits. Extremities: Symmetric 5 x 5 power. Skin: No rashes, lesions or ulcers Psychiatry: Judgement and insight appear normal. Mood & affect appropriate.     Data Reviewed: I have personally reviewed following labs and imaging studies  CBC:  Recent Labs Lab 04/23/17 2350 04/24/17 0715 04/25/17 0358 04/26/17 0258  WBC 6.4 4.7 5.3 5.3  NEUTROABS 2.4  --   --   --   HGB 13.1 12.1 11.4* 12.2  HCT 40.4 36.4 34.7* 37.2  MCV 85.8 85.2 83.4 84.7  PLT 362 318 281 288   Basic Metabolic Panel:  Recent Labs Lab 04/23/17 2350 04/24/17 0715 04/25/17 0358 04/25/17 1222 04/26/17 0258  NA 146* 141 141  --  142  K 3.4* 3.8 3.7  --  3.4*  CL 110 108 113*  --  109  CO2 26 20* 21*  --  27  GLUCOSE 88 101* 107*  --  97  BUN 9 7 10   --  11  CREATININE 0.72 0.59 0.60  --  0.73  CALCIUM 9.4 8.4* 8.6*  --  9.0  MG  --   --  1.8 2.1 2.0   GFR: Estimated Creatinine Clearance: 103.2 mL/min (by C-G formula based on SCr of 0.73 mg/dL). Liver Function Tests:  Recent Labs Lab 04/23/17 2350 04/24/17 0715 04/25/17 0358 04/26/17 0258  AST 18 18 14* 12*  ALT 17 16 17 16   ALKPHOS 79 60 57 63  BILITOT 0.3 0.7 0.4 0.4  PROT 8.4* 7.5 6.4* 6.9  ALBUMIN 4.7 4.0 3.3* 3.7   No results for input(s): LIPASE, AMYLASE in the last 168 hours. No results for input(s): AMMONIA in the last 168 hours. Coagulation Profile:  Recent Labs Lab 04/24/17 0715 04/25/17 0358  INR 1.09 1.12   Cardiac Enzymes:  Recent Labs Lab 04/25/17 1222 04/25/17 1525 04/25/17 2118  TROPONINI <0.03 <0.03 <0.03   BNP (last 3 results) No results for input(s): PROBNP in the last 8760 hours. HbA1C: No results for input(s): HGBA1C in the last 72 hours. CBG:  Recent  Labs Lab 04/24/17 0800 04/25/17 0719 04/26/17 0757  GLUCAP 112* 105* 94   Lipid Profile: No results for input(s): CHOL, HDL, LDLCALC, TRIG, CHOLHDL, LDLDIRECT in the last 72 hours. Thyroid Function Tests:  Recent Labs  04/25/17 1222  TSH 0.403   Anemia Panel: No results for input(s): VITAMINB12, FOLATE, FERRITIN, TIBC, IRON, RETICCTPCT in the last 72 hours. Sepsis Labs: No results for input(s): PROCALCITON, LATICACIDVEN in the last 168 hours.  Recent Results (from the past 240 hour(s))  MRSA PCR Screening     Status: None   Collection Time: 04/24/17  5:31 AM  Result Value Ref Range Status   MRSA by PCR NEGATIVE NEGATIVE Final    Comment:        The  GeneXpert MRSA Assay (FDA approved for NASAL specimens only), is one component of a comprehensive MRSA colonization surveillance program. It is not intended to diagnose MRSA infection nor to guide or monitor treatment for MRSA infections.          Radiology Studies: Dg Chest 2 View  Result Date: 04/25/2017 CLINICAL DATA:  Tachycardia.  Shortness of breath EXAM: CHEST  2 VIEW COMPARISON:  02/22/2016 FINDINGS: Normal heart size and mediastinal contours. No acute infiltrate or edema. No effusion or pneumothorax. No osseous findings. IMPRESSION: Negative chest. Electronically Signed   By: Marnee Spring M.D.   On: 04/25/2017 09:36        Scheduled Meds: . amLODipine  10 mg Oral Daily  . enoxaparin (LOVENOX) injection  40 mg Subcutaneous Q24H  . folic acid  1 mg Oral Daily  . LORazepam  0-4 mg Intravenous Q12H  . multivitamin with minerals  1 tablet Oral Daily  . nicotine  21 mg Transdermal Daily  . oxyCODONE  10 mg Oral TID PC  . potassium chloride  20 mEq Oral Once  . senna-docusate  1 tablet Oral BID  . thiamine  100 mg Oral Daily  . traZODone  50 mg Oral QHS   Continuous Infusions:    LOS: 2 days     Time spent: 40 mins    THOMPSON,DANIEL, MD [facility] Pager [phone]  If 7PM-7AM,  please contact night-coverage [email or web address] Password [facility] 04/26/2017, 9:44 AM

## 2017-04-26 NOTE — Consult Note (Signed)
[facility] Consult   Reason for Consult:  Tylenol Overdose Referring Physician:  Dr. Grandville Silos Patient Identification: Kristi Marshall MRN:  [id number] Principal Diagnosis: Overdose on Tylenol Diagnosis:   Patient Active Problem List   Diagnosis Date Noted  . SOB (shortness of breath) [R06.02]   . Tachycardia [R00.0]   . Alcohol use [Z78.9] 04/24/2017  . Overdose on Tylenol [T39.1X1A] 04/24/2017  . Hypokalemia [E87.6] 04/24/2017  . Headache [R51] 04/24/2017  . HTN (hypertension) [I10] 04/24/2017  . Intentional drug overdose ([place]) [T50.902A]   . Acetaminophen toxicity [T39.1X1A]   . Normal labor [O80, Z37.9] 01/14/2017  . Vaginal delivery [O80] 01/09/2015  . Pyelonephritis affecting pregnancy in second trimester, antepartum [O23.02] 08/09/2014  . Pyelonephritis [N12] 03/22/2014  . Menorrhagia [N92.0] 07/31/2012  . Dysmenorrhea [N94.6] 07/31/2012  . Single episode of elevated blood pressure [R03.0] 07/31/2012  . Seizure-like activity ([place]) [R56.9] 06/20/2012  . 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 ALLERGIC RHINITIS [J30.9] 12/17/2009  . TOBACCO USER [F17.200] 05/24/2009  . SEIZURE DISORDER [R56.9] 12/20/2008    Total Time spent with patient: 45 minutes  Subjective:   Kristi Marshall is a 31 y.o. female patient admitted after she overdosed on Tylenol.  HPI:  Kristi Marshall is a 31 years old female admitted to [facility] ICU for status post acetaminophen overdose. Patient was seen by Dr. Darleene Cleaver but patient stated that she does not remember due to being in crisis state. Patient has history of Anxiety, headaches, pseudoseizure and has no reported out patient or inpatient treatment. Patient endorsing intentional tylenol overdose about 10 tablets when she was upset and frustrated after argument with her children father and trying to get positive attention. She denied suicide ideation, intention or plan and reported it is an impulse act which she regrets during this visit. She  has six children and two older children are at bed side when arrived for this evaluation. She is stressed about taking care of her two younger children (3 years and 3 months) as she has no place to live beside staying with children father for the last two years since she last low income housing. Her children were sent out of the room for privacy during this assessment. She denied physical abuse or child abuse and stated that her children father is a good man and care for them.   She denies having alcohol problem and describe herself as ''social drinker.'' Patient Tylenol level was 130 on admission and as of 04/25/2017 it is less than 10. Patient is calm, cooperative, denies SI/HI, delusions or psychosis. She states: ''I love my children, I will never kill myself.'' However, patient agreed to outpatient referral for counseling.   Past Psychiatric History: as above  Risk to Self: Is patient at risk for suicide?: Not currently Risk to Others:   Denies by patient  Prior Inpatient Therapy:   None reported Prior Outpatient Therapy:   None  Past Medical History:  Past Medical History:  Diagnosis Date  . Anxiety   . Bronchitis   . Headache    migraines  . PIH (pregnancy induced hypertension)   . Pregnancy induced hypertension   . Pyelonephritis   . Seasonal allergies   . Seizure [facility])    pseudoseizures; stress related    Past Surgical History:  Procedure Laterality Date  . abortions    . DILATION AND CURETTAGE OF UTERUS     ? retained POC    Family History:  Family History  Problem Relation Age of Onset  .  Hypertension Mother   . Diabetes Maternal Aunt   . Hypertension Maternal Grandmother   . Diabetes Maternal Grandmother   . Heart disease Maternal Grandmother   . Hypertension Maternal Grandfather   . Heart disease Maternal Grandfather   . Anesthesia problems Neg Hx   . Hypotension Neg Hx   . Malignant hyperthermia Neg Hx   . Pseudochol deficiency Neg Hx   . Other Neg Hx   .  Hearing loss Neg Hx    Family Psychiatric  History:  Social History:  History  Alcohol Use  . Yes    Comment: beer tonight     History  Drug Use No    Social History   Social History  . Marital status: Single    Spouse name: N/A  . Number of children: N/A  . Years of education: N/A   Social History Main Topics  . Smoking status: Former Smoker    Years: 7.00    Types: Cigarettes    Quit date: 05/24/2016  . Smokeless tobacco: Former Systems developer     Comment: Second hand smoke  . Alcohol use Yes     Comment: beer tonight  . Drug use: No  . Sexual activity: Yes     Comment: last intercourse over a month ago   Other Topics Concern  . None   Social History Narrative  . None   Additional Social History:    Allergies:   Allergies  Allergen Reactions  . Bactrim [Sulfamethoxazole-Trimethoprim] Nausea And Vomiting and Other (See Comments)    high fevers.   . Ibuprofen Itching  . Toradol [Ketorolac Tromethamine] Itching    Labs:  Results for orders placed or performed during the hospital encounter of 04/23/17 (from the past 48 hour(s))  Comprehensive metabolic panel     Status: Abnormal   Collection Time: 04/25/17  3:58 AM  Result Value Ref Range   Sodium 141 135 - 145 mmol/L   Potassium 3.7 3.5 - 5.1 mmol/L   Chloride 113 (H) 101 - 111 mmol/L   CO2 21 (L) 22 - 32 mmol/L   Glucose, Bld 107 (H) 65 - 99 mg/dL   BUN 10 6 - 20 mg/dL   Creatinine, Ser 0.60 0.44 - 1.00 mg/dL   Calcium 8.6 (L) 8.9 - 10.3 mg/dL   Total Protein 6.4 (L) 6.5 - 8.1 g/dL   Albumin 3.3 (L) 3.5 - 5.0 g/dL   AST 14 (L) 15 - 41 U/L   ALT 17 14 - 54 U/L   Alkaline Phosphatase 57 38 - 126 U/L   Total Bilirubin 0.4 0.3 - 1.2 mg/dL   GFR calc non Af Amer >60 >60 mL/min   GFR calc Af Amer >60 >60 mL/min    Comment: (NOTE) The eGFR has been calculated using the CKD EPI equation. This calculation has not been validated in all clinical situations. eGFR's persistently <60 mL/min signify possible Chronic  Kidney Disease.    Anion gap 7 5 - 15  Protime-INR     Status: None   Collection Time: 04/25/17  3:58 AM  Result Value Ref Range   Prothrombin Time 14.5 11.4 - 15.2 seconds   INR 1.12   Acetaminophen level     Status: Abnormal   Collection Time: 04/25/17  3:58 AM  Result Value Ref Range   Acetaminophen (Tylenol), Serum <10 (L) 10 - 30 ug/mL    Comment:        THERAPEUTIC CONCENTRATIONS VARY SIGNIFICANTLY. A RANGE OF 10-30 ug/mL MAY  BE AN EFFECTIVE CONCENTRATION FOR MANY PATIENTS. HOWEVER, SOME ARE BEST TREATED AT CONCENTRATIONS OUTSIDE THIS RANGE. ACETAMINOPHEN CONCENTRATIONS >150 ug/mL AT 4 HOURS AFTER INGESTION AND >50 ug/mL AT 12 HOURS AFTER INGESTION ARE OFTEN ASSOCIATED WITH TOXIC REACTIONS.   CBC     Status: Abnormal   Collection Time: 04/25/17  3:58 AM  Result Value Ref Range   WBC 5.3 4.0 - 10.5 K/uL   RBC 4.16 3.87 - 5.11 MIL/uL   Hemoglobin 11.4 (L) 12.0 - 15.0 g/dL   HCT 34.7 (L) 36.0 - 46.0 %   MCV 83.4 78.0 - 100.0 fL   MCH 27.4 26.0 - 34.0 pg   MCHC 32.9 30.0 - 36.0 g/dL   RDW 16.6 (H) 11.5 - 15.5 %   Platelets 281 150 - 400 K/uL  Magnesium     Status: None   Collection Time: 04/25/17  3:58 AM  Result Value Ref Range   Magnesium 1.8 1.7 - 2.4 mg/dL  Glucose, capillary     Status: Abnormal   Collection Time: 04/25/17  7:19 AM  Result Value Ref Range   Glucose-Capillary 105 (H) 65 - 99 mg/dL   Comment 1 Notify RN    Comment 2 Document in Chart   Troponin I (q 6hr x 3)     Status: None   Collection Time: 04/25/17 12:22 PM  Result Value Ref Range   Troponin I <0.03 <0.03 ng/mL  TSH     Status: None   Collection Time: 04/25/17 12:22 PM  Result Value Ref Range   TSH 0.403 0.350 - 4.500 uIU/mL    Comment: Performed by a 3rd Generation assay with a functional sensitivity of <=0.01 uIU/mL.  Magnesium     Status: None   Collection Time: 04/25/17 12:22 PM  Result Value Ref Range   Magnesium 2.1 1.7 - 2.4 mg/dL  Troponin I (q 6hr x 3)     Status: None    Collection Time: 04/25/17  3:25 PM  Result Value Ref Range   Troponin I <0.03 <0.03 ng/mL  Troponin I (q 6hr x 3)     Status: None   Collection Time: 04/25/17  9:18 PM  Result Value Ref Range   Troponin I <0.03 <0.03 ng/mL  Comprehensive metabolic panel     Status: Abnormal   Collection Time: 04/26/17  2:58 AM  Result Value Ref Range   Sodium 142 135 - 145 mmol/L   Potassium 3.4 (L) 3.5 - 5.1 mmol/L   Chloride 109 101 - 111 mmol/L   CO2 27 22 - 32 mmol/L   Glucose, Bld 97 65 - 99 mg/dL   BUN 11 6 - 20 mg/dL   Creatinine, Ser 0.73 0.44 - 1.00 mg/dL   Calcium 9.0 8.9 - 10.3 mg/dL   Total Protein 6.9 6.5 - 8.1 g/dL   Albumin 3.7 3.5 - 5.0 g/dL   AST 12 (L) 15 - 41 U/L   ALT 16 14 - 54 U/L   Alkaline Phosphatase 63 38 - 126 U/L   Total Bilirubin 0.4 0.3 - 1.2 mg/dL   GFR calc non Af Amer >60 >60 mL/min   GFR calc Af Amer >60 >60 mL/min    Comment: (NOTE) The eGFR has been calculated using the CKD EPI equation. This calculation has not been validated in all clinical situations. eGFR's persistently <60 mL/min signify possible Chronic Kidney Disease.    Anion gap 6 5 - 15  CBC     Status: Abnormal   Collection Time:  04/26/17  2:58 AM  Result Value Ref Range   WBC 5.3 4.0 - 10.5 K/uL   RBC 4.39 3.87 - 5.11 MIL/uL   Hemoglobin 12.2 12.0 - 15.0 g/dL   HCT 37.2 36.0 - 46.0 %   MCV 84.7 78.0 - 100.0 fL   MCH 27.8 26.0 - 34.0 pg   MCHC 32.8 30.0 - 36.0 g/dL   RDW 16.5 (H) 11.5 - 15.5 %   Platelets 288 150 - 400 K/uL  Magnesium     Status: None   Collection Time: 04/26/17  2:58 AM  Result Value Ref Range   Magnesium 2.0 1.7 - 2.4 mg/dL  Glucose, capillary     Status: None   Collection Time: 04/26/17  7:57 AM  Result Value Ref Range   Glucose-Capillary 94 65 - 99 mg/dL   Comment 1 Notify RN    Comment 2 Document in Chart     Current Facility-Administered Medications  Medication Dose Route Frequency Provider Last Rate Last Dose  . albuterol (PROVENTIL) (2.5 MG/3ML)  0.083% nebulizer solution 3 mL  3 mL Inhalation Q6H PRN Eugenie Filler, MD      . enoxaparin (LOVENOX) injection 40 mg  40 mg Subcutaneous Q24H Ivor Costa, MD   40 mg at 04/26/17 2010  . folic acid (FOLVITE) tablet 1 mg  1 mg Oral Daily Ivor Costa, MD   1 mg at 04/26/17 0712  . hydrALAZINE (APRESOLINE) injection 10 mg  10 mg Intravenous Q6H PRN Jani Gravel, MD   10 mg at 04/25/17 0748  . ibuprofen (ADVIL,MOTRIN) tablet 400 mg  400 mg Oral Q6H PRN Eugenie Filler, MD   400 mg at 04/25/17 0554  . LORazepam (ATIVAN) injection 0-4 mg  0-4 mg Intravenous Q12H Ivor Costa, MD      . LORazepam (ATIVAN) injection 1 mg  1 mg Intravenous Q2H PRN Ivor Costa, MD      . LORazepam (ATIVAN) tablet 1 mg  1 mg Oral Q6H PRN Ivor Costa, MD   1 mg at 04/25/17 1975   Or  . LORazepam (ATIVAN) injection 1 mg  1 mg Intravenous Q6H PRN Ivor Costa, MD   1 mg at 04/25/17 1946  . metoprolol tartrate (LOPRESSOR) tablet 25 mg  25 mg Oral BID Eugenie Filler, MD   25 mg at 04/26/17 1026  . multivitamin with minerals tablet 1 tablet  1 tablet Oral Daily Ivor Costa, MD   1 tablet at 04/26/17 8832  . nicotine (NICODERM CQ - dosed in mg/24 hours) patch 21 mg  21 mg Transdermal Daily Eugenie Filler, MD   21 mg at 04/26/17 5498  . ondansetron (ZOFRAN) tablet 4 mg  4 mg Oral Q6H PRN Ivor Costa, MD       Or  . ondansetron [facility]) injection 4 mg  4 mg Intravenous Q6H PRN Ivor Costa, MD   4 mg at 04/25/17 1941  . oxyCODONE (Oxy IR/ROXICODONE) immediate release tablet 10 mg  10 mg Oral TID PC Eugenie Filler, MD   10 mg at 04/26/17 2641  . potassium chloride 20 MEQ/15ML (10%) solution 20 mEq  20 mEq Oral Once Ivor Costa, MD      . senna-docusate (Senokot-S) tablet 1 tablet  1 tablet Oral BID Eugenie Filler, MD   1 tablet at 04/26/17 [phone]  . thiamine (VITAMIN B-1) tablet 100 mg  100 mg Oral Daily Ivor Costa, MD   100 mg at 04/26/17 9407  . traZODone (DESYREL) tablet  50 mg  50 mg Oral QHS Corena Pilgrim, MD   50 mg at  04/25/17 2246    Musculoskeletal: Strength & Muscle Tone: within normal limits Gait & Station: normal Patient leans: N/A  Psychiatric Specialty Exam: Physical Exam  Psychiatric: Her behavior is normal. Judgment and thought content normal. Her mood appears anxious. Her speech is delayed. Cognition and memory are normal.    Review of Systems  Constitutional: Positive for malaise/fatigue.  HENT: Negative.   Eyes: Negative.   Respiratory: Negative.   Cardiovascular: Negative.   Gastrointestinal: Negative.   Genitourinary: Negative.   Musculoskeletal: Negative.   Skin: Negative.   Neurological: Negative.   Endo/Heme/Allergies: Negative.   Psychiatric/Behavioral: Positive for substance abuse. The patient is nervous/anxious.     Blood pressure (!) 147/93, pulse 88, temperature 98.6 F (37 C), temperature source Oral, resp. rate 16, height '5\' 4"'  (1.626 m), weight 75.1 kg (165 lb 9.1 oz), last menstrual period 04/25/2017, SpO2 99 %, unknown if currently breastfeeding.Body mass index is 28.42 kg/m.  General Appearance: Casual  Eye Contact:  Good  Speech:  Clear and Coherent  Volume:  Normal  Mood:  Euthymic  Affect:  Constricted  Thought Process:  Coherent and Descriptions of Associations: Intact  Orientation:  Full (Time, Place, and Person)  Thought Content:  Logical  Suicidal Thoughts:  No  Homicidal Thoughts:  No  Memory:  Immediate;   Good Recent;   Good Remote;   Good  Judgement:  Intact  Insight:  Fair  Psychomotor Activity:  Decreased  Concentration:  Concentration: Fair and Attention Span: Fair  Recall:  Good  Fund of Knowledge:  Good  Language:  Good  Akathisia:  No  Handed:  Right  AIMS (if indicated):     Assets:  Communication Skills Desire for Improvement Social Support  ADL's:  Intact  Cognition:  WNL  Sleep:   fair     Treatment Plan Summary: 31 year old woman with no prior significant history of mental illness except for a remote history of  anxiety. She was brought for treatment after she overdosed on Tylenol which she claimed was a cry for attention from her baby's father and not a suicide attempt. She reports being under stress and has been argues with baby's father a lot lately. Today, patient denies SI/HI, psychosis and delusional thinking.  Assessment: Adjustment disorder with emotional disturbance  Plan:  Continue Lorazepam protocol for alcohol detox Continue Trazodone 50 mg qhs at bedtime for insomnia Will start sertraline 25 mg daily for depression and anxiety Patient is cleared from psychiatric service Does not meet criteria for inpatient psychic admission Will benefit from outpatient counseling upon discharge. Case discussed with CSW and Dr. Grandville Silos  Disposition: No evidence of imminent risk to self or others at present.   Patient does not meet criteria for psychiatric inpatient admission. Supportive therapy provided about ongoing stressors. Unit social worker to assist with outpatient referral for counseling upon discharge  Ambrose Finland, MD 04/26/2017 12:17 PM

## 2017-08-07 ENCOUNTER — Ambulatory Visit (HOSPITAL_COMMUNITY)
Admission: EM | Admit: 2017-08-07 | Discharge: 2017-08-07 | Disposition: A | Discharge status: Home / Self Care | Payer: Medicaid Other | Attending: Family Medicine | Admitting: Family Medicine

## 2017-08-07 ENCOUNTER — Encounter (HOSPITAL_COMMUNITY): Payer: Self-pay | Admitting: Emergency Medicine

## 2017-08-07 DIAGNOSIS — M2550 Pain in unspecified joint: Secondary | ICD-10-CM | POA: Diagnosis not present

## 2017-08-07 MED ORDER — MELOXICAM 7.5 MG PO TABS: 7.5000 mg | ORAL_TABLET | Freq: Every day | ORAL | 0 refills | Status: DC

## 2017-08-07 NOTE — Discharge Instructions (Signed)
Please follow up with your primary care provider for further evaluation as you may need additional testing to evaluate these symptoms. Will try daily antiinflammatory to see if this is helpful for you. To take one a day, take with food.

## 2017-08-07 NOTE — ED Provider Notes (Signed)
[facility]    CSN: [id number] Arrival date & time: 08/07/17  1758     History   Chief Complaint Chief Complaint  Patient presents with  . Joint Pain    HPI Kristi Marshall is a 31 y.o. female.   Kristi Marshall presents with complaints of generalized joint pains which have been intermittent yet persistent for years now. She feels it has been worse over the past few days. States she has pain to her elbows, knees, ankles, shoulders and at times her wrists. She experiences swelling at times, denies any currently. She states she saw her PCP for this approximately 1.5 years ago and was recommended she get tested for lupus and ra but she did not complete this testing. No injuries to her joints. Has not taken any medications for her symptoms. Is not taking any medications currently. States she is possibly going to switch primary care providers. She feels that cold application makes her symptoms worse. Without skin rash or lesions.   ROS per HPI.       Past Medical History:  Diagnosis Date  . Anxiety   . Bronchitis   . Headache    migraines  . PIH (pregnancy induced hypertension)   . Pregnancy induced hypertension   . Pyelonephritis   . Seasonal allergies   . Seizure [facility])    pseudoseizures; stress related    Patient Active Problem List   Diagnosis Date Noted  . Adjustment disorder with emotional disturbance 04/26/2017  . SOB (shortness of breath)   . Tachycardia   . Alcohol use 04/24/2017  . Overdose on Tylenol 04/24/2017  . Hypokalemia 04/24/2017  . Headache 04/24/2017  . HTN (hypertension) 04/24/2017  . Intentional drug overdose ([facility])   . Acetaminophen toxicity   . Normal labor 01/14/2017  . Vaginal delivery 01/09/2015  . Pyelonephritis affecting pregnancy in second trimester, antepartum 08/09/2014  . Pyelonephritis 03/22/2014  . Menorrhagia 07/31/2012  . Dysmenorrhea 07/31/2012  . Single episode of elevated blood pressure 07/31/2012  . Seizure-like  activity ([facility]) 06/20/2012  . THYROMEGALY 12/17/2009  . ALLERGIC RHINITIS 12/17/2009  . TOBACCO USER 05/24/2009  . SEIZURE DISORDER 12/20/2008    Past Surgical History:  Procedure Laterality Date  . abortions    . DILATION AND CURETTAGE OF UTERUS     ? retained POC     OB History    Gravida Para Term Preterm AB Living   9 6 6  0 3 6   SAB TAB Ectopic Multiple Live Births   0 3 0 0 6       Home Medications    Prior to Admission medications   Medication Sig Start Date End Date Taking? Authorizing Provider  acetaminophen (TYLENOL) 500 MG tablet Take 500 mg by mouth every 6 (six) hours as needed for mild pain or headache.    [provider]  albuterol (PROVENTIL HFA;VENTOLIN HFA) 108 (90 Base) MCG/ACT inhaler Inhale 2 puffs into the lungs every 6 (six) hours as needed for wheezing or shortness of breath.    [provider]  meloxicam (MOBIC) 7.5 MG tablet Take 1 tablet (7.5 mg total) by mouth daily. 08/07/17   Georgetta HaberBurky, Uzziah Rigg B, NP    Family History Family History  Problem Relation Age of Onset  . Hypertension Mother   . Diabetes Maternal Aunt   . Hypertension Maternal Grandmother   . Diabetes Maternal Grandmother   . Heart disease Maternal Grandmother   . Hypertension Maternal Grandfather   . Heart disease Maternal  Grandfather   . Anesthesia problems Neg Hx   . Hypotension Neg Hx   . Malignant hyperthermia Neg Hx   . Pseudochol deficiency Neg Hx   . Other Neg Hx   . Hearing loss Neg Hx     Social History Social History   Tobacco Use  . Smoking status: Former Smoker    Years: 7.00    Types: Cigarettes    Last attempt to quit: 05/24/2016    Years since quitting: 1.2  . Smokeless tobacco: Former NeurosurgeonUser  . Tobacco comment: Second hand smoke  Substance Use Topics  . Alcohol use: Yes    Comment: beer tonight  . Drug use: No     Allergies   Bactrim [sulfamethoxazole-trimethoprim]; Ibuprofen; and Toradol [ketorolac tromethamine]   Review of  Systems Review of Systems   Physical Exam Triage Vital Signs ED Triage Vitals [08/07/17 1817]  Enc Vitals Group     BP (!) 163/104     Pulse Rate 88     Resp 18     Temp 99.1 F (37.3 C)     Temp Source Oral     SpO2 99 %     Weight      Height      Head Circumference      Peak Flow      Pain Score      Pain Loc      Pain Edu?      Excl. in GC?    No data found.  Updated Vital Signs BP (!) 163/104 (BP Location: Left Arm)   Pulse 88   Temp 99.1 F (37.3 C) (Oral)   Resp 18   SpO2 99%   Visual Acuity Right Eye Distance:   Left Eye Distance:   Bilateral Distance:    Right Eye Near:   Left Eye Near:    Bilateral Near:     Physical Exam  Constitutional: She is oriented to person, place, and time. She appears well-developed and well-nourished. No distress.  Cardiovascular: Normal rate, regular rhythm and normal heart sounds.  Pulmonary/Chest: Effort normal and breath sounds normal.  Musculoskeletal:  Ambulatory and moving all extremities and joints during exam; without swelling or redness; strength to upper and lower extremities equal bilaterally; ambulatory  Neurological: She is alert and oriented to person, place, and time.  Skin: Skin is warm and dry.  Vitals reviewed.    UC Treatments / Results  Labs (all labs ordered are listed, but only abnormal results are displayed) Labs Reviewed - No data to display  EKG  EKG Interpretation None       Radiology No results found.  Procedures Procedures (including critical care time)  Medications Ordered in UC Medications - No data to display   Initial Impression / Assessment and Plan / UC Course  I have reviewed the triage vital signs and the nursing notes.  Pertinent labs & imaging results that were available during my care of the patient were reviewed by me and considered in my medical decision making (see chart for details).     Chronic joint pain. Discussed with patient differentials of  autoimmune disorder vs arthritis vs depression. Will try daily meloxicam to see if this helps with symptoms. Recommended follow up with PCP as may need further testing for additional rule outs of autoimmune disorders. Patient verbalized understanding and agreeable to plan. Ambulatory out of clinic without difficulty.    Final Clinical Impressions(s) / UC Diagnoses   Final diagnoses:  Arthralgia, unspecified  joint    ED Discharge Orders        Ordered    meloxicam (MOBIC) 7.5 MG tablet  Daily     08/07/17 1836       Controlled Substance Prescriptions [place] Controlled Substance Registry consulted? Not Applicable   Georgetta Haber, NP 08/07/17 [phone]

## 2017-08-07 NOTE — ED Triage Notes (Signed)
Pt here for joint pain x 2 years

## 2018-09-13 NOTE — L&D Delivery Note (Signed)
Patient: Kristi Marshall MRN: [id number]  GBS status: Negative, IAP given: None   Patient is a 33 y.o. now G10P7 s/p NSVD at [redacted]w[redacted]d, who was admitted for SOL. AROM 0h 72m prior to delivery with clear fluid.    Delivery Note At 3:26 PM a viable female was delivered via Vaginal, Spontaneous (Presentation: OA).  APGAR: 8, 9; weight pending.   Placenta status: spontaneous, intact.  Cord: 3 vessel with the following complications: none.   Head delivered OA. No nuchal cord present. Shoulder and body delivered in usual fashion. Infant with spontaneous cry, held by RN, dried and bulb suctioned. Cord clamped x 2 after 1-minute delay, and cut by this provider. Cord blood drawn. Placenta delivered spontaneously with gentle cord traction. Fundus firm with massage and Pitocin. Perineum inspected and found to have small first degree laceration, which was found to be hemostatic.  Anesthesia:  Epidural  Episiotomy: None Lacerations: 1st degree Suture Repair: None Est. Blood Loss (mL): 501  Mom to postpartum.  Baby to [place], [place].  Melina Schools 04/16/2019, 4:25 PM

## 2018-09-17 ENCOUNTER — Emergency Department (HOSPITAL_COMMUNITY)
Admission: EM | Admit: 2018-09-17 | Discharge: 2018-09-17 | Disposition: A | Discharge status: Home / Self Care | Payer: Medicaid Other | Attending: Emergency Medicine | Admitting: Emergency Medicine

## 2018-09-17 ENCOUNTER — Encounter: Payer: Self-pay | Admitting: Emergency Medicine

## 2018-09-17 DIAGNOSIS — O23511 Infections of cervix in pregnancy, first trimester: Secondary | ICD-10-CM | POA: Diagnosis not present

## 2018-09-17 DIAGNOSIS — N83202 Unspecified ovarian cyst, left side: Secondary | ICD-10-CM | POA: Diagnosis not present

## 2018-09-17 DIAGNOSIS — Z3A01 Less than 8 weeks gestation of pregnancy: Secondary | ICD-10-CM | POA: Insufficient documentation

## 2018-09-17 DIAGNOSIS — N72 Inflammatory disease of cervix uteri: Secondary | ICD-10-CM

## 2018-09-17 DIAGNOSIS — R300 Dysuria: Secondary | ICD-10-CM | POA: Diagnosis present

## 2018-09-17 DIAGNOSIS — N83201 Unspecified ovarian cyst, right side: Secondary | ICD-10-CM | POA: Diagnosis not present

## 2018-09-17 DIAGNOSIS — O10011 Pre-existing essential hypertension complicating pregnancy, first trimester: Secondary | ICD-10-CM | POA: Diagnosis not present

## 2018-09-17 DIAGNOSIS — Z3201 Encounter for pregnancy test, result positive: Secondary | ICD-10-CM | POA: Insufficient documentation

## 2018-09-17 DIAGNOSIS — Z87891 Personal history of nicotine dependence: Secondary | ICD-10-CM | POA: Insufficient documentation

## 2018-09-17 DIAGNOSIS — Z79899 Other long term (current) drug therapy: Secondary | ICD-10-CM | POA: Insufficient documentation

## 2018-09-17 DIAGNOSIS — Z3491 Encounter for supervision of normal pregnancy, unspecified, first trimester: Secondary | ICD-10-CM

## 2018-09-17 LAB — WET PREP, GENITAL
SPERM: NONE SEEN
Trich, Wet Prep: NONE SEEN
Yeast Wet Prep HPF POC: NONE SEEN

## 2018-09-17 LAB — URINALYSIS, ROUTINE W REFLEX MICROSCOPIC
Bacteria, UA: NONE SEEN
Bilirubin Urine: NEGATIVE
Glucose, UA: NEGATIVE mg/dL
Ketones, ur: 80 mg/dL — AB
Leukocytes, UA: NEGATIVE
Nitrite: NEGATIVE
Protein, ur: 30 mg/dL — AB
SPECIFIC GRAVITY, URINE: 1.033 — AB (ref 1.005–1.030)
pH: 5 (ref 5.0–8.0)

## 2018-09-17 LAB — CBC WITH DIFFERENTIAL/PLATELET
Abs Immature Granulocytes: 0.01 10*3/uL (ref 0.00–0.07)
Basophils Absolute: 0 10*3/uL (ref 0.0–0.1)
Basophils Relative: 0 %
Eosinophils Absolute: 0 10*3/uL (ref 0.0–0.5)
Eosinophils Relative: 1 %
HCT: 32.5 % — ABNORMAL LOW (ref 36.0–46.0)
Hemoglobin: 10.5 g/dL — ABNORMAL LOW (ref 12.0–15.0)
Immature Granulocytes: 0 %
Lymphocytes Relative: 38 %
Lymphs Abs: 2.2 10*3/uL (ref 0.7–4.0)
MCH: 30.1 pg (ref 26.0–34.0)
MCHC: 32.3 g/dL (ref 30.0–36.0)
MCV: 93.1 fL (ref 80.0–100.0)
Monocytes Absolute: 0.3 10*3/uL (ref 0.1–1.0)
Monocytes Relative: 5 %
Neutro Abs: 3.2 10*3/uL (ref 1.7–7.7)
Neutrophils Relative %: 56 %
PLATELETS: 252 10*3/uL (ref 150–400)
RBC: 3.49 MIL/uL — ABNORMAL LOW (ref 3.87–5.11)
RDW: 13.8 % (ref 11.5–15.5)
WBC: 5.8 10*3/uL (ref 4.0–10.5)
nRBC: 0 % (ref 0.0–0.2)

## 2018-09-17 LAB — COMPREHENSIVE METABOLIC PANEL
ALT: 15 U/L (ref 0–44)
AST: 28 U/L (ref 15–41)
Albumin: 3.7 g/dL (ref 3.5–5.0)
Alkaline Phosphatase: 67 U/L (ref 38–126)
Anion gap: 11 (ref 5–15)
BUN: 8 mg/dL (ref 6–20)
CO2: 20 mmol/L — ABNORMAL LOW (ref 22–32)
Calcium: 8.7 mg/dL — ABNORMAL LOW (ref 8.9–10.3)
Chloride: 103 mmol/L (ref 98–111)
Creatinine, Ser: 0.49 mg/dL (ref 0.44–1.00)
GFR calc Af Amer: >60 mL/min (ref >60)
Glucose, Bld: 99 mg/dL (ref 70–99)
Potassium: 4.2 mmol/L (ref 3.5–5.1)
Sodium: 134 mmol/L — ABNORMAL LOW (ref 135–145)
TOTAL PROTEIN: 7.3 g/dL (ref 6.5–8.1)
Total Bilirubin: 1.4 mg/dL — ABNORMAL HIGH (ref 0.3–1.2)

## 2018-09-17 LAB — I-STAT BETA HCG BLOOD, ED (MC, WL, AP ONLY): I-stat hCG, quantitative: >2000 m[IU]/mL — ABNORMAL HIGH (ref <5)

## 2018-09-17 LAB — LIPASE, BLOOD: Lipase: 35 U/L (ref 11–51)

## 2018-09-17 MED ORDER — SODIUM CHLORIDE 0.9 % IV SOLN
1.0000 g | Freq: Once | INTRAVENOUS | Status: AC
  Administered 2018-09-17: 1 g via INTRAVENOUS
  Filled 2018-09-17: qty 10

## 2018-09-17 MED ORDER — METOCLOPRAMIDE HCL 10 MG PO TABS: 10.0000 mg | ORAL_TABLET | Freq: Four times a day (QID) | ORAL | 0 refills | Status: DC | PRN

## 2018-09-17 MED ORDER — AMOXICILLIN-POT CLAVULANATE 875-125 MG PO TABS: 1.0000 | ORAL_TABLET | Freq: Two times a day (BID) | ORAL | 0 refills | Status: DC

## 2018-09-17 MED ORDER — AZITHROMYCIN 250 MG PO TABS
1000.0000 mg | ORAL_TABLET | Freq: Once | ORAL | Status: AC
  Administered 2018-09-17: 1000 mg via ORAL
  Filled 2018-09-17: qty 4

## 2018-09-17 MED ORDER — MORPHINE SULFATE (PF) 4 MG/ML IV SOLN
4.0000 mg | Freq: Once | INTRAVENOUS | Status: AC
  Administered 2018-09-17: 4 mg via INTRAVENOUS
  Filled 2018-09-17: qty 1

## 2018-09-17 MED ORDER — ONDANSETRON HCL 4 MG/2ML IJ SOLN
4.0000 mg | Freq: Once | INTRAMUSCULAR | Status: AC
  Administered 2018-09-17: 4 mg via INTRAVENOUS
  Filled 2018-09-17: qty 2

## 2018-09-17 MED ORDER — MORPHINE SULFATE 15 MG PO TABS: 15.0000 mg | ORAL_TABLET | ORAL | 0 refills | Status: DC | PRN

## 2018-09-17 MED ORDER — SODIUM CHLORIDE 0.9 % IV BOLUS
1000.0000 mL | Freq: Once | INTRAVENOUS | Status: AC
  Administered 2018-09-17: 1000 mL via INTRAVENOUS

## 2018-09-17 NOTE — Discharge Instructions (Signed)
Go to a drugstore and buy unisom and vitaminb6(pyridoxine) Take 1/2 a tab of unisom(12.5mg ) and 25mg  of vitamin b6.  Take this at night before bed.  If you continue to have nausea and vomiting take twice a day.  [facility] or [facility].  Follow up with your obgyn.  As we discussed there is no medication other than Tylenol that is safe in pregnancy.  Please return to the ED for worsening pain fever or inability to eat or drink.

## 2018-09-17 NOTE — ED Notes (Signed)
Patient refuses RPR and HIV blood test at this time. Patient reports she will follow up with an OBGYN. Discharge instructions reviewed with patient. Patient verbalizes understanding. VSS.

## 2018-09-17 NOTE — ED Provider Notes (Signed)
[facility]-EMERGENCY DEPT Provider Note   CSN: [id number] Arrival date & time: 09/17/18  0715     History   Chief Complaint Chief Complaint  Patient presents with  . Abdominal Pain  . Flank Pain    HPI Kristi Marshall is a 33 y.o. female.  33 yo F with 2 days of left-sided flank pain.  Starts in her low back and radiates down to her groin.  Sharp and shooting.  Worse with movement palpation and twisting.  Having some mild dysuria increased frequency and hesitancy as well.  She denies vaginal bleeding or discharge.  Denies trauma.  Denies likelihood of being pregnant.  Denies fevers or chills.  Has had some nausea with this pain.  Has had kidney stones in the past and thinks this may be similar.  The history is provided by the patient.  Abdominal Pain   This is a new problem. The current episode started yesterday. The problem occurs constantly. The problem has been rapidly worsening. The pain is associated with an unknown factor. The quality of the pain is sharp and shooting. The pain is at a severity of 9/10. The pain is moderate. Associated symptoms include nausea. Pertinent negatives include fever, vomiting, dysuria, headaches, arthralgias and myalgias. Nothing aggravates the symptoms. Nothing relieves the symptoms.  Flank Pain  Associated symptoms include abdominal pain. Pertinent negatives include no chest pain, no headaches and no shortness of breath.    Past Medical History:  Diagnosis Date  . Anxiety   . Bronchitis   . Headache    migraines  . PIH (pregnancy induced hypertension)   . Pregnancy induced hypertension   . Pyelonephritis   . Seasonal allergies   . 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 Adjustment disorder with emotional disturbance 04/26/2017  . SOB (shortness of breath)   . Tachycardia   . Alcohol use 04/24/2017  . Overdose on Tylenol 04/24/2017  . Hypokalemia  04/24/2017  . Headache 04/24/2017  . HTN (hypertension) 04/24/2017  . Intentional drug overdose (HCC)   . Acetaminophen toxicity   . Normal labor 01/14/2017  . Vaginal delivery 01/09/2015  . Pyelonephritis affecting pregnancy in second trimester, antepartum 08/09/2014  . Pyelonephritis 03/22/2014  . Menorrhagia 07/31/2012  . Dysmenorrhea 07/31/2012  . Single episode of elevated blood pressure 07/31/2012  . Seizure-like activity (HCC) 06/20/2012  . THYROMEGALY 12/17/2009  . ALLERGIC RHINITIS 12/17/2009  . TOBACCO USER 05/24/2009  . SEIZURE DISORDER 12/20/2008    Past Surgical History:  Procedure Laterality Date  . abortions    . DILATION AND CURETTAGE OF UTERUS     ? retained POC      OB History    Gravida  10   Para  6   Term  6   Preterm  0   AB  3   Living  6     SAB  0   TAB  3   Ectopic  0   Multiple  0   Live Births  6            Home Medications    Prior to Admission medications   Medication Sig Start Date End Date Taking? Authorizing Provider  buprenorphine-naloxone (SUBOXONE) 8-2 mg SUBL SL tablet Place 1 tablet under the tongue daily.   Yes [provider]  amoxicillin-clavulanate (AUGMENTIN) 875-125 MG tablet Take 1 tablet by mouth 2 (two) times daily. One  po bid x 7 days 09/17/18   Melene PlanFloyd, Amaurie Wandel, DO  metoCLOPramide (REGLAN) 10 MG tablet Take 1 tablet (10 mg total) by mouth every 6 (six) hours as needed for nausea (nausea/headache). 09/17/18   Melene PlanFloyd, Jerilee Space, DO  morphine (MSIR) 15 MG tablet Take 1 tablet (15 mg total) by mouth every 4 (four) hours as needed for severe pain. 09/17/18   Melene PlanFloyd, Julis Haubner, DO    Family History Family History  Problem Relation Age of Onset  . Hypertension Mother   . Diabetes Maternal Aunt   . Hypertension Maternal Grandmother   . Diabetes Maternal Grandmother   . Heart disease Maternal Grandmother   . Hypertension Maternal Grandfather   . Heart disease Maternal Grandfather   . Anesthesia problems Neg Hx   .  Hypotension Neg Hx   . Malignant hyperthermia Neg Hx   . Pseudochol deficiency Neg Hx   . Other Neg Hx   . Hearing loss Neg Hx     Social History Social History   Tobacco Use  . Smoking status: Former Smoker    Years: 7.00    Types: Cigarettes    Last attempt to quit: 05/24/2016    Years since quitting: 2.3  . Smokeless tobacco: Former NeurosurgeonUser  . Tobacco comment: Second hand smoke  Substance Use Topics  . Alcohol use: Yes    Comment: beer tonight  . Drug use: No     Allergies   Bactrim [sulfamethoxazole-trimethoprim]; Ibuprofen; and Toradol [ketorolac tromethamine]   Review of Systems Review of Systems  Constitutional: Negative for chills and fever.  HENT: Negative for congestion and rhinorrhea.   Eyes: Negative for redness and visual disturbance.  Respiratory: Negative for shortness of breath and wheezing.   Cardiovascular: Negative for chest pain and palpitations.  Gastrointestinal: Positive for abdominal pain and nausea. Negative for vomiting.  Genitourinary: Positive for flank pain. Negative for dysuria and urgency.  Musculoskeletal: Negative for arthralgias and myalgias.  Skin: Negative for pallor and wound.  Neurological: Negative for dizziness and headaches.     Physical Exam Updated Vital Signs BP 123/70   Pulse 72   Temp 98.6 F (37 C) (Oral)   Resp 18   LMP 07/05/2018 (Approximate)   SpO2 99%   Physical Exam Vitals signs and nursing note reviewed.  Constitutional:      General: She is not in acute distress.    Appearance: She is well-developed. She is not diaphoretic.  HENT:     Head: Normocephalic and atraumatic.  Eyes:     Pupils: Pupils are equal, round, and reactive to light.  Neck:     Musculoskeletal: Normal range of motion and neck supple.  Cardiovascular:     Rate and Rhythm: Normal rate and regular rhythm.     Heart sounds: No murmur. No friction rub. No gallop.   Pulmonary:     Effort: Pulmonary effort is normal.     Breath sounds:  No wheezing or rales.  Abdominal:     General: There is no distension.     Palpations: Abdomen is soft.     Tenderness: There is abdominal tenderness (llq, left cva).  Genitourinary:    Comments: Purulent drainage at the office.  Tenderness diffusely about the uterus.  Tenderness is the same bilaterally. Musculoskeletal:        General: No tenderness.  Skin:    General: Skin is warm and dry.  Neurological:     Mental Status: She is alert and oriented to person, place, and  time.  Psychiatric:        Behavior: Behavior normal.      ED Treatments / Results  Labs (all labs ordered are listed, but only abnormal results are displayed) Labs Reviewed  WET PREP, GENITAL - Abnormal; Notable for the following components:      Result Value   Clue Cells Wet Prep HPF POC PRESENT (*)    WBC, Wet Prep HPF POC MANY (*)    All other components within normal limits  COMPREHENSIVE METABOLIC PANEL - Abnormal; Notable for the following components:   Sodium 134 (*)    CO2 20 (*)    Calcium 8.7 (*)    Total Bilirubin 1.4 (*)    All other components within normal limits  URINALYSIS, ROUTINE W REFLEX MICROSCOPIC - Abnormal; Notable for the following components:   Specific Gravity, Urine 1.033 (*)    Hgb urine dipstick SMALL (*)    Ketones, ur 80 (*)    Protein, ur 30 (*)    All other components within normal limits  CBC WITH DIFFERENTIAL/PLATELET - Abnormal; Notable for the following components:   RBC 3.49 (*)    Hemoglobin 10.5 (*)    HCT 32.5 (*)    All other components within normal limits  I-STAT BETA HCG BLOOD, ED ([facility], [facility], [facility] ONLY) - Abnormal; Notable for the following components:   I-stat hCG, quantitative >2,000.0 (*)    All other components within normal limits  LIPASE, BLOOD  CBC WITH DIFFERENTIAL/PLATELET  RPR  HIV ANTIBODY (ROUTINE TESTING W REFLEX)  GC/CHLAMYDIA PROBE AMP ([place]    EKG None  Radiology No results found.  Procedures Procedures  (including critical care time) EMERGENCY DEPARTMENT US PREGNANCY "Study: Limited Ultrasound of the Pelvis"  INDICATIONS:Pregnancy(required) and Abdominal or pelvic pain Multiple views of the uterus and pelvic cavity are obtained with a multi-frequency probe.  APPROACH:Transabdominal   PERFORMED BY: Myself  IMAGES ARCHIVED?: Yes  LIMITATIONS: pain  PREGNANCY FREE FLUID: Present  PREGNANCY UTERUS FINDINGS:Uterus enlarged and Gestational sac noted ADNEXAL FINDINGS:Left ovarion cyst and Right ovarion cyst  PREGNANCY FINDINGS: Fetal pole present and Fetal heart activity seen  INTERPRETATION: Intrauterine gestational sac noted and Fetal heart activity seen  GESTATIONAL AGE, ESTIMATE: 6wk 5 days.   Medications Ordered in ED Medications  cefTRIAXone (ROCEPHIN) 1 g in sodium chloride 0.9 % 100 mL IVPB (has no administration in time range)  azithromycin (ZITHROMAX) tablet 1,000 mg (has no administration in time range)  morphine 4 MG/ML injection 4 mg (4 mg Intravenous Given 09/17/18 1004)  ondansetron (ZOFRAN) injection 4 mg (4 mg Intravenous Given 09/17/18 1003)  sodium chloride 0.9 % bolus 1,000 mL (1,000 mLs Intravenous Bolus 09/17/18 1004)     Initial Impression / Assessment and Plan / ED Course  I have reviewed the triage vital signs and the nursing notes.  Pertinent labs & imaging results that were available during my care of the patient were reviewed by me and considered in my medical decision making (see chart for details).     33 yo F with a chief complaint of left flank pain.  Patient is found to be newly pregnant.  Bedside ultrasound with IUP.  No concerning findings in the left adnexa.  Will perform a pelvic exam.  She has had kidney stones previously though seems less likely with focal abdominal pain.  Very small amount of hematuria.   My bedside ultrasound with the IUP.  Pelvic exam with significant purulent drainage and tenderness  about the uterus.  I discussed the case  with Dr. Elroy Channel, OB/GYN he felt that this was likely to be cervicitis this early in pregnancy and recommended Rocephin and azithromycin.  He did state that if I felt she needed broader pelvic coverage that Augmentin would be a good choice.  We will have the patient follow-up with OB/GYN in the office.  1:13 PM:  I have discussed the diagnosis/risks/treatment options with the patient and family and believe the pt to be eligible for discharge home to follow-up with GYN. We also discussed returning to the ED immediately if new or worsening sx occur. We discussed the sx which are most concerning (e.g., sudden worsening pain, fever, inability to tolerate by mouth) that necessitate immediate return. Medications administered to the patient during their visit and any new prescriptions provided to the patient are listed below.  Medications given during this visit Medications  cefTRIAXone (ROCEPHIN) 1 g in sodium chloride 0.9 % 100 mL IVPB (has no administration in time range)  azithromycin (ZITHROMAX) tablet 1,000 mg (has no administration in time range)  morphine 4 MG/ML injection 4 mg (4 mg Intravenous Given 09/17/18 1004)  ondansetron (ZOFRAN) injection 4 mg (4 mg Intravenous Given 09/17/18 1003)  sodium chloride 0.9 % bolus 1,000 mL (1,000 mLs Intravenous Bolus 09/17/18 1004)      The patient appears reasonably screen and/or stabilized for discharge and I doubt any other medical condition or other [facility] requiring further screening, evaluation, or treatment in the ED at this time prior to discharge.     Final Clinical Impressions(s) / ED Diagnoses   Final diagnoses:  Cervicitis  First trimester pregnancy    ED Discharge Orders         Ordered    amoxicillin-clavulanate (AUGMENTIN) 875-125 MG tablet  2 times daily     09/17/18 1309    metoCLOPramide (REGLAN) 10 MG tablet  Every 6 hours PRN     09/17/18 1309    morphine (MSIR) 15 MG tablet  Every 4 hours PRN     09/17/18 1309           Melene Plan, DO 09/17/18 1313

## 2018-09-17 NOTE — ED Triage Notes (Signed)
Pt with L abdominal pain / L flank pain since last evening. Pt states she took Tylenol and Tylenol PM between  0100-0400. Pt c/o constipation. Pt states LMP end of 07/04/2018.

## 2018-09-18 LAB — GC/CHLAMYDIA PROBE AMP (~~LOC~~) NOT AT ARMC
Chlamydia: NEGATIVE
Neisseria Gonorrhea: NEGATIVE

## 2019-04-08 ENCOUNTER — Inpatient Hospital Stay (HOSPITAL_COMMUNITY)
Admission: EM | Admit: 2019-04-08 | Discharge: 2019-04-09 | Disposition: A | Discharge status: Home / Self Care | Payer: Medicaid Other | Attending: Obstetrics & Gynecology | Admitting: Obstetrics & Gynecology

## 2019-04-08 ENCOUNTER — Other Ambulatory Visit: Payer: Self-pay

## 2019-04-08 DIAGNOSIS — M25562 Pain in left knee: Secondary | ICD-10-CM | POA: Insufficient documentation

## 2019-04-08 DIAGNOSIS — Z3A35 35 weeks gestation of pregnancy: Secondary | ICD-10-CM | POA: Insufficient documentation

## 2019-04-08 DIAGNOSIS — O1493 Unspecified pre-eclampsia, third trimester: Secondary | ICD-10-CM | POA: Insufficient documentation

## 2019-04-08 DIAGNOSIS — N760 Acute vaginitis: Secondary | ICD-10-CM

## 2019-04-08 DIAGNOSIS — B9689 Other specified bacterial agents as the cause of diseases classified elsewhere: Secondary | ICD-10-CM | POA: Diagnosis not present

## 2019-04-08 DIAGNOSIS — Z87891 Personal history of nicotine dependence: Secondary | ICD-10-CM | POA: Diagnosis not present

## 2019-04-08 DIAGNOSIS — O26893 Other specified pregnancy related conditions, third trimester: Secondary | ICD-10-CM | POA: Diagnosis not present

## 2019-04-08 DIAGNOSIS — O23593 Infection of other part of genital tract in pregnancy, third trimester: Secondary | ICD-10-CM | POA: Diagnosis not present

## 2019-04-08 DIAGNOSIS — O0933 Supervision of pregnancy with insufficient antenatal care, third trimester: Secondary | ICD-10-CM | POA: Insufficient documentation

## 2019-04-09 ENCOUNTER — Encounter (HOSPITAL_COMMUNITY): Payer: Self-pay | Admitting: Emergency Medicine

## 2019-04-09 ENCOUNTER — Emergency Department (HOSPITAL_COMMUNITY): Payer: Medicaid Other

## 2019-04-09 ENCOUNTER — Other Ambulatory Visit: Payer: Self-pay

## 2019-04-09 ENCOUNTER — Encounter (HOSPITAL_COMMUNITY): Payer: Self-pay | Admitting: *Deleted

## 2019-04-09 ENCOUNTER — Telehealth (HOSPITAL_COMMUNITY): Payer: Self-pay | Admitting: *Deleted

## 2019-04-09 LAB — WET PREP, GENITAL
Sperm: NONE SEEN
Trich, Wet Prep: NONE SEEN
Yeast Wet Prep HPF POC: NONE SEEN

## 2019-04-09 LAB — URINALYSIS, ROUTINE W REFLEX MICROSCOPIC
Bacteria, UA: NONE SEEN
Bilirubin Urine: NEGATIVE
Glucose, UA: NEGATIVE mg/dL
Ketones, ur: NEGATIVE mg/dL
Nitrite: NEGATIVE
Protein, ur: NEGATIVE mg/dL
Specific Gravity, Urine: 1.006 (ref 1.005–1.030)
pH: 6 (ref 5.0–8.0)

## 2019-04-09 LAB — RPR: RPR Ser Ql: NONREACTIVE

## 2019-04-09 LAB — COMPREHENSIVE METABOLIC PANEL
ALT: 9 U/L (ref 0–44)
AST: 16 U/L (ref 15–41)
Albumin: 2.4 g/dL — ABNORMAL LOW (ref 3.5–5.0)
Alkaline Phosphatase: 260 U/L — ABNORMAL HIGH (ref 38–126)
Anion gap: 12 (ref 5–15)
BUN: 7 mg/dL (ref 6–20)
CO2: 20 mmol/L — ABNORMAL LOW (ref 22–32)
Calcium: 8.6 mg/dL — ABNORMAL LOW (ref 8.9–10.3)
Chloride: 105 mmol/L (ref 98–111)
Creatinine, Ser: 0.63 mg/dL (ref 0.44–1.00)
GFR calc Af Amer: >60 mL/min (ref >60)
GFR calc non Af Amer: >60 mL/min (ref >60)
Glucose, Bld: 134 mg/dL — ABNORMAL HIGH (ref 70–99)
Potassium: 3.5 mmol/L (ref 3.5–5.1)
Sodium: 137 mmol/L (ref 135–145)
Total Bilirubin: 0.5 mg/dL (ref 0.3–1.2)
Total Protein: 5.9 g/dL — ABNORMAL LOW (ref 6.5–8.1)

## 2019-04-09 LAB — CBC WITH DIFFERENTIAL/PLATELET
Abs Immature Granulocytes: 0.08 10*3/uL — ABNORMAL HIGH (ref 0.00–0.07)
Basophils Absolute: 0 10*3/uL (ref 0.0–0.1)
Basophils Relative: 1 %
Eosinophils Absolute: 0.1 10*3/uL (ref 0.0–0.5)
Eosinophils Relative: 2 %
HCT: 31.4 % — ABNORMAL LOW (ref 36.0–46.0)
Hemoglobin: 10 g/dL — ABNORMAL LOW (ref 12.0–15.0)
Immature Granulocytes: 1 %
Lymphocytes Relative: 32 %
Lymphs Abs: 2.5 10*3/uL (ref 0.7–4.0)
MCH: 28.3 pg (ref 26.0–34.0)
MCHC: 31.8 g/dL (ref 30.0–36.0)
MCV: 89 fL (ref 80.0–100.0)
Monocytes Absolute: 0.3 10*3/uL (ref 0.1–1.0)
Monocytes Relative: 4 %
Neutro Abs: 4.9 10*3/uL (ref 1.7–7.7)
Neutrophils Relative %: 60 %
Platelets: 267 10*3/uL (ref 150–400)
RBC: 3.53 MIL/uL — ABNORMAL LOW (ref 3.87–5.11)
RDW: 13.2 % (ref 11.5–15.5)
WBC: 8 10*3/uL (ref 4.0–10.5)
nRBC: 1.3 % — ABNORMAL HIGH (ref 0.0–0.2)

## 2019-04-09 LAB — RAPID URINE DRUG SCREEN, HOSP PERFORMED
Amphetamines: NOT DETECTED
Barbiturates: NOT DETECTED
Benzodiazepines: NOT DETECTED
Cocaine: NOT DETECTED
Opiates: NOT DETECTED
Tetrahydrocannabinol: NOT DETECTED

## 2019-04-09 LAB — RUBELLA SCREEN: Rubella: 1.32 index (ref >0.99)

## 2019-04-09 LAB — PROTEIN / CREATININE RATIO, URINE
Creatinine, Urine: 45.75 mg/dL
Protein Creatinine Ratio: 0.39 mg/mg{Cre} — ABNORMAL HIGH (ref 0.00–0.15)
Total Protein, Urine: 18 mg/dL

## 2019-04-09 LAB — HEMOGLOBIN A1C
Hgb A1c MFr Bld: 6.3 % — ABNORMAL HIGH (ref 4.8–5.6)
Mean Plasma Glucose: 134.11 mg/dL

## 2019-04-09 LAB — HIV ANTIBODY (ROUTINE TESTING W REFLEX): HIV Screen 4th Generation wRfx: NONREACTIVE

## 2019-04-09 LAB — HEPATITIS B SURFACE ANTIGEN: Hepatitis B Surface Ag: NEGATIVE

## 2019-04-09 MED ORDER — PRENATAL 27-0.8 MG PO TABS: 1.0000 | ORAL_TABLET | Freq: Every day | ORAL | 0 refills | Status: DC

## 2019-04-09 MED ORDER — METRONIDAZOLE 500 MG PO TABS: 500.0000 mg | ORAL_TABLET | Freq: Two times a day (BID) | ORAL | 0 refills | Status: DC

## 2019-04-09 MED ORDER — DOCUSATE SODIUM 250 MG PO CAPS: 250.0000 mg | ORAL_CAPSULE | Freq: Every day | ORAL | 0 refills | Status: DC

## 2019-04-09 MED ORDER — FERROUS SULFATE 325 (65 FE) MG PO TABS: 325.0000 mg | ORAL_TABLET | Freq: Every day | ORAL | 0 refills | Status: DC

## 2019-04-09 NOTE — ED Triage Notes (Signed)
Patient having left knee pain after twisting it about 1-2 weeks ago.  Patient having more pain when she walks.

## 2019-04-09 NOTE — MAU Provider Note (Addendum)
History     CSN: [id number]  Arrival date and time: 04/08/19 2338   First Provider Initiated Contact with Patient 04/09/19 0541      Chief Complaint  Patient presents with  . Knee Pain   HPI Kristi Marshall is a 33 y.o. [id number] at [redacted]w[redacted]d who presents from the ED for evaluation of hypertension. She was seen in the ED for knee pain and incidentally had elevated blood pressures. She denies any HA, visual changes or epigastric pain. She denies any abdominal pain, vaginal bleeding or leaking of fluid. Reports normal fetal movement.   She has not had any prenatal care during this pregnancy and reports transportation and "many other things" as barriers to receiving care. She reports she now has reliable transportation and childcare and wishes to establish care.    OB History    Gravida  10   Para  6   Term  6   Preterm  0   AB  3   Living  6     SAB  0   TAB  3   Ectopic  0   Multiple  0   Live Births  6           Past Medical History:  Diagnosis Date  . Anxiety   . Bronchitis   . Headache    migraines  . PIH (pregnancy induced hypertension)   . Pregnancy induced hypertension   . Pyelonephritis   . Seasonal allergies   . Seizure [facility])    pseudoseizures; stress related    Past Surgical History:  Procedure Laterality Date  . abortions    . DILATION AND CURETTAGE OF UTERUS     ? retained POC     Family History  Problem Relation Age of Onset  . Hypertension Mother   . Diabetes Maternal Aunt   . Hypertension Maternal Grandmother   . Diabetes Maternal Grandmother   . Heart disease Maternal Grandmother   . Hypertension Maternal Grandfather   . Heart disease Maternal Grandfather   . Anesthesia problems Neg Hx   . Hypotension Neg Hx   . Malignant hyperthermia Neg Hx   . Pseudochol deficiency Neg Hx   . Other Neg Hx   . Hearing loss Neg Hx     Social History   Tobacco Use  . Smoking status: Former Smoker    Years: 7.00    Types: Cigarettes     Quit date: 05/24/2016    Years since quitting: 2.8  . Smokeless tobacco: Former Systems developer  . Tobacco comment: Second hand smoke  Substance Use Topics  . Alcohol use: Yes  . Drug use: No    Allergies:  Allergies  Allergen Reactions  . Bactrim [Sulfamethoxazole-Trimethoprim] Nausea And Vomiting and Other (See Comments)    high fevers.   . Ibuprofen Itching  . Toradol [Ketorolac Tromethamine] Itching    Medications Prior to Admission  Medication Sig Dispense Refill Last Dose  . SUBOXONE 8-2 MG FILM Place 1 Film under the tongue daily.    04/08/2019 at Unknown time  . amoxicillin-clavulanate (AUGMENTIN) 875-125 MG tablet Take 1 tablet by mouth 2 (two) times daily. One po bid x 7 days (Patient not taking: Reported on 04/09/2019) 14 tablet 0 Completed Course at Unknown time  . metoCLOPramide (REGLAN) 10 MG tablet Take 1 tablet (10 mg total) by mouth every 6 (six) hours as needed for nausea (nausea/headache). (Patient not taking: Reported on 04/09/2019) 6 tablet 0 Completed Course at Unknown time  .  morphine (MSIR) 15 MG tablet Take 1 tablet (15 mg total) by mouth every 4 (four) hours as needed for severe pain. (Patient not taking: Reported on 04/09/2019) 3 tablet 0 Completed Course at Unknown time    Review of Systems  Constitutional: Negative.  Negative for fatigue and fever.  HENT: Negative.   Respiratory: Negative.  Negative for shortness of breath.   Cardiovascular: Negative.  Negative for chest pain.  Gastrointestinal: Negative.  Negative for abdominal pain, constipation, diarrhea, nausea and vomiting.  Genitourinary: Negative.  Negative for dysuria.  Musculoskeletal:       Knee pain  Neurological: Negative.  Negative for dizziness and headaches.   Physical Exam   Blood pressure 137/86, pulse 70, temperature 98 F (36.7 C), temperature source Oral, resp. rate 16, last menstrual period 07/05/2018, SpO2 99 %, unknown if currently breastfeeding.  Patient Vitals for the past 24 hrs:   BP Temp Temp src Pulse Resp SpO2  04/09/19 0646 124/79 - - 70 - -  04/09/19 0631 123/80 - - 68 - -  04/09/19 0616 129/86 - - 81 - -  04/09/19 0601 (!) 136/103 - - 74 - -  04/09/19 0538 137/86 98 F (36.7 C) Oral 70 16 99 %  04/09/19 0217 (!) 132/103 - - 82 16 99 %  04/09/19 0000 (!) 140/92 98.4 F (36.9 C) Oral 78 (!) 22 100 %    Physical Exam  Nursing note and vitals reviewed. Constitutional: She is oriented to person, place, and time. She appears well-developed and well-nourished. No distress.  HENT:  Head: Normocephalic.  Eyes: Pupils are equal, round, and reactive to light.  Cardiovascular: Normal rate, regular rhythm and normal heart sounds.  Respiratory: Effort normal and breath sounds normal. No respiratory distress.  GI: Soft. Bowel sounds are normal. She exhibits no distension. There is no abdominal tenderness.  FH: 35cm, Vertex by leopolds  Musculoskeletal:        General: Edema (+3 bilateral LE) present.  Neurological: She is alert and oriented to person, place, and time.  Skin: Skin is warm and dry.  Psychiatric: She has a normal mood and affect. Her behavior is normal. Judgment and thought content normal.    Fetal Tracing:  Baseline: 140 Variability: moderate Accels: 15x15 Decels: none  Toco: occasional uc's  MAU Course  Procedures Results for orders placed or performed during the hospital encounter of 04/08/19 (from the past 24 hour(s))  CBC with Differential     Status: Abnormal   Collection Time: 04/09/19  3:02 AM  Result Value Ref Range   WBC 8.0 4.0 - 10.5 K/uL   RBC 3.53 (L) 3.87 - 5.11 MIL/uL   Hemoglobin 10.0 (L) 12.0 - 15.0 g/dL   HCT 16.131.4 (L) 09.636.0 - 04.546.0 %   MCV 89.0 80.0 - 100.0 fL   MCH 28.3 26.0 - 34.0 pg   MCHC 31.8 30.0 - 36.0 g/dL   RDW 40.913.2 81.111.5 - 91.415.5 %   Platelets 267 150 - 400 K/uL   nRBC 1.3 (H) 0.0 - 0.2 %   Neutrophils Relative % 60 %   Neutro Abs 4.9 1.7 - 7.7 K/uL   Lymphocytes Relative 32 %   Lymphs Abs 2.5 0.7 - 4.0 K/uL    Monocytes Relative 4 %   Monocytes Absolute 0.3 0.1 - 1.0 K/uL   Eosinophils Relative 2 %   Eosinophils Absolute 0.1 0.0 - 0.5 K/uL   Basophils Relative 1 %   Basophils Absolute 0.0 0.0 - 0.1 K/uL  Immature Granulocytes 1 %   Abs Immature Granulocytes 0.08 (H) 0.00 - 0.07 K/uL  Comprehensive metabolic panel     Status: Abnormal   Collection Time: 04/09/19  3:02 AM  Result Value Ref Range   Sodium 137 135 - 145 mmol/L   Potassium 3.5 3.5 - 5.1 mmol/L   Chloride 105 98 - 111 mmol/L   CO2 20 (L) 22 - 32 mmol/L   Glucose, Bld 134 (H) 70 - 99 mg/dL   BUN 7 6 - 20 mg/dL   Creatinine, Ser 1.610.63 0.44 - 1.00 mg/dL   Calcium 8.6 (L) 8.9 - 10.3 mg/dL   Total Protein 5.9 (L) 6.5 - 8.1 g/dL   Albumin 2.4 (L) 3.5 - 5.0 g/dL   AST 16 15 - 41 U/L   ALT 9 0 - 44 U/L   Alkaline Phosphatase 260 (H) 38 - 126 U/L   Total Bilirubin 0.5 0.3 - 1.2 mg/dL   GFR calc non Af Amer >60 >60 mL/min   GFR calc Af Amer >60 >60 mL/min   Anion gap 12 5 - 15  Urinalysis, Routine w reflex microscopic     Status: Abnormal   Collection Time: 04/09/19  3:03 AM  Result Value Ref Range   Color, Urine STRAW (A) YELLOW   APPearance CLEAR CLEAR   Specific Gravity, Urine 1.006 1.005 - 1.030   pH 6.0 5.0 - 8.0   Glucose, UA NEGATIVE NEGATIVE mg/dL   Hgb urine dipstick SMALL (A) NEGATIVE   Bilirubin Urine NEGATIVE NEGATIVE   Ketones, ur NEGATIVE NEGATIVE mg/dL   Protein, ur NEGATIVE NEGATIVE mg/dL   Nitrite NEGATIVE NEGATIVE   Leukocytes,Ua SMALL (A) NEGATIVE   RBC / HPF 0-5 0 - 5 RBC/hpf   WBC, UA 0-5 0 - 5 WBC/hpf   Bacteria, UA NONE SEEN NONE SEEN   Squamous Epithelial / LPF 0-5 0 - 5  Rapid urine drug screen (hospital performed)     Status: None   Collection Time: 04/09/19  3:03 AM  Result Value Ref Range   Opiates NONE DETECTED NONE DETECTED   Cocaine NONE DETECTED NONE DETECTED   Benzodiazepines NONE DETECTED NONE DETECTED   Amphetamines NONE DETECTED NONE DETECTED   Tetrahydrocannabinol NONE DETECTED  NONE DETECTED   Barbiturates NONE DETECTED NONE DETECTED  Protein / creatinine ratio, urine     Status: Abnormal   Collection Time: 04/09/19  3:03 AM  Result Value Ref Range   Creatinine, Urine 45.75 mg/dL   Total Protein, Urine 18 mg/dL   Protein Creatinine Ratio 0.39 (H) 0.00 - 0.15 mg/mg[Cre]  Wet prep, genital     Status: Abnormal   Collection Time: 04/09/19  6:01 AM  Result Value Ref Range   Yeast Wet Prep HPF POC NONE SEEN NONE SEEN   Trich, Wet Prep NONE SEEN NONE SEEN   Clue Cells Wet Prep HPF POC PRESENT (A) NONE SEEN   WBC, Wet Prep HPF POC MANY (A) NONE SEEN   Sperm NONE SEEN    MDM Per chart review, patient had early u/s in ED and measured 5958w5d by CRL. Fundal height tonight consistent with early dating u/s. Consulted with Dr. Macon LargeAnyanwu, ok to use early ultrasound for dating and schedule anatomy outpatient.  OB panel GBS culture Wet prep and gc/chlamydia UDS Protein/creat ratio  Patient declined 1hr GTT while in MAU. HgbA1C Protein/creat ratio elevated making her preeclamptic without severe features. Will add BPP order to ultrasound this week and schedule IOL for 37 weeks. Strict preeclampsia  precautions reviewed with patient.   Assessment and Plan   1. Pre-eclampsia in third trimester   2. No prenatal care in current pregnancy in third trimester   3. Acute pain of left knee   4. Bacterial vaginosis    -Discharge home in stable condition -Rx for prenatal vitamins, iron supplement, colace, metronidazole sent to patient's pharmacy -Preterm labor precautions discussed -Patient advised to follow-up with MFM for outpatient u/s. IOL scheduled 8/5 at 730am. No availability on 8/4. -Patient may return to MAU as needed or if her condition were to change or worsen  Rolm BookbinderCaroline M Wania Longstreth CNM 04/09/2019, 5:41 AM

## 2019-04-09 NOTE — MAU Note (Signed)
Pt transferred from ED for elevated BP. Was seen in ED for evaluation of knee pain. No PNC. Thinks she is [redacted] weeks pregnant. No complaints of contractions, LOF or vaginal bleeding. Reports good fetal movement. Pt denies HA or vision changes. Reports a lot of swelling in feet and ankles.

## 2019-04-09 NOTE — Telephone Encounter (Signed)
Preadmission screen  

## 2019-04-09 NOTE — ED Notes (Addendum)
Patient states she is pregnant, her last menstrual cycle was 07/05/18 and she has not had any prenatal care or OBGYN visit.

## 2019-04-09 NOTE — Discharge Instructions (Signed)

## 2019-04-09 NOTE — ED Provider Notes (Signed)
[facility]   CSN: [id number] Arrival date & time: 04/08/19  2338     History   Chief Complaint Chief Complaint  Patient presents with  . Knee Pain    HPI Kristi Marshall is a 33 y.o. female.     The history is provided by the patient and medical records.  Knee Pain   32 y.o. [id number] approx 9 month gestation  (LMP 07/11/18) with no prior prenatal care, with hx of anxiety, migraine headaches, season allergies, presenting to the ED for left knee pain.  Patient states she twisted her left knee 2 weeks ago and has ongoing pain ever since.  States she was walking past her boyfriend in their home in a narrow space, she stepped and twisted her left knee.  States she has had ongoing pain, more so with weightbearing and ambulation.  She denies any numbness or weakness.  States she has done this before and been able to manage pain at home.  No prior surgeries.  Of note, patient's blood pressure is elevated here, 145/105 during my assessment.  She does have pregnancy-induced hypertension/preeclampsia with prior pregnancies.  States when not pregnant her blood pressure is fine and she does not have any issues.  She has had some increased swelling of her ankles over the past 2 weeks.  She denies any headaches, dizziness, blurred vision, confusion, etc.  She has not had any vaginal bleeding or loss of fluid.  She is feeling fetal movement regularly.  No contractions.  Past Medical History:  Diagnosis Date  . Anxiety   . Bronchitis   . Headache    migraines  . PIH (pregnancy induced hypertension)   . Pregnancy induced hypertension   . Pyelonephritis   . Seasonal allergies   . 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 Adjustment disorder with emotional disturbance 04/26/2017  . SOB (shortness of breath)   . Tachycardia   . Alcohol use 04/24/2017  . Overdose on Tylenol  04/24/2017  . Hypokalemia 04/24/2017  . Headache 04/24/2017  . HTN (hypertension) 04/24/2017  . Intentional drug overdose (HCC)   . Acetaminophen toxicity   . Normal labor 01/14/2017  . Vaginal delivery 01/09/2015  . Pyelonephritis affecting pregnancy in second trimester, antepartum 08/09/2014  . Pyelonephritis 03/22/2014  . Menorrhagia 07/31/2012  . Dysmenorrhea 07/31/2012  . Single episode of elevated blood pressure 07/31/2012  . Seizure-like activity (HCC) 06/20/2012  . THYROMEGALY 12/17/2009  . ALLERGIC RHINITIS 12/17/2009  . TOBACCO USER 05/24/2009  . SEIZURE DISORDER 12/20/2008    Past Surgical History:  Procedure Laterality Date  . abortions    . DILATION AND CURETTAGE OF UTERUS     ? retained POC      OB History    Gravida  10   Para  6   Term  6   Preterm  0   AB  3   Living  6     SAB  0   TAB  3   Ectopic  0   Multiple  0   Live Births  6            Home Medications    Prior to Admission medications   Medication Sig Start Date End Date Taking? Authorizing Provider  amoxicillin-clavulanate (AUGMENTIN) 875-125 MG tablet Take 1 tablet by mouth 2 (two) times daily. One po bid x 7 days 09/17/18  Melene PlanFloyd, Dan, DO  buprenorphine-naloxone (SUBOXONE) 8-2 mg SUBL SL tablet Place 1 tablet under the tongue daily.    [provider]  metoCLOPramide (REGLAN) 10 MG tablet Take 1 tablet (10 mg total) by mouth every 6 (six) hours as needed for nausea (nausea/headache). 09/17/18   Melene PlanFloyd, Dan, DO  morphine (MSIR) 15 MG tablet Take 1 tablet (15 mg total) by mouth every 4 (four) hours as needed for severe pain. 09/17/18   Melene PlanFloyd, Dan, DO    Family History Family History  Problem Relation Age of Onset  . Hypertension Mother   . Diabetes Maternal Aunt   . Hypertension Maternal Grandmother   . Diabetes Maternal Grandmother   . Heart disease Maternal Grandmother   . Hypertension Maternal Grandfather   . Heart disease Maternal Grandfather   . Anesthesia  problems Neg Hx   . Hypotension Neg Hx   . Malignant hyperthermia Neg Hx   . Pseudochol deficiency Neg Hx   . Other Neg Hx   . Hearing loss Neg Hx     Social History Social History   Tobacco Use  . Smoking status: Former Smoker    Years: 7.00    Types: Cigarettes    Quit date: 05/24/2016    Years since quitting: 2.8  . Smokeless tobacco: Former NeurosurgeonUser  . Tobacco comment: Second hand smoke  Substance Use Topics  . Alcohol use: Yes  . Drug use: No     Allergies   Bactrim [sulfamethoxazole-trimethoprim], Ibuprofen, and Toradol [ketorolac tromethamine]   Review of Systems Review of Systems  All other systems reviewed and are negative.    Physical Exam Updated Vital Signs BP (!) (P) 140/92 (BP Location: Left Arm)   Pulse (P) 78   Temp (P) 98.4 F (36.9 C) (Oral)   Resp (!) (P) 22   LMP 07/05/2018 (Approximate)   SpO2 (P) 100%   Physical Exam Vitals signs and nursing note reviewed.  Constitutional:      Appearance: She is well-developed.  HENT:     Head: Normocephalic and atraumatic.  Eyes:     Conjunctiva/sclera: Conjunctivae normal.     Pupils: Pupils are equal, round, and reactive to light.  Neck:     Musculoskeletal: Normal range of motion.  Cardiovascular:     Rate and Rhythm: Normal rate and regular rhythm.     Heart sounds: Normal heart sounds.  Pulmonary:     Effort: Pulmonary effort is normal.     Breath sounds: Normal breath sounds.  Abdominal:     General: Bowel sounds are normal.     Palpations: Abdomen is soft.     Comments: Gravid abdomen  Musculoskeletal: Normal range of motion.     Comments: 2+ pitting edema at the ankles  Skin:    General: Skin is warm and dry.  Neurological:     Mental Status: She is alert and oriented to person, place, and time.      ED Treatments / Results  Labs (all labs ordered are listed, but only abnormal results are displayed) Labs Reviewed  CBC WITH DIFFERENTIAL/PLATELET - Abnormal; Notable for the  following components:      Result Value   RBC 3.53 (*)    Hemoglobin 10.0 (*)    HCT 31.4 (*)    nRBC 1.3 (*)    Abs Immature Granulocytes 0.08 (*)    All other components within normal limits  COMPREHENSIVE METABOLIC PANEL - Abnormal; Notable for the following components:   CO2 20 (*)  Glucose, Bld 134 (*)    Calcium 8.6 (*)    Total Protein 5.9 (*)    Albumin 2.4 (*)    Alkaline Phosphatase 260 (*)    All other components within normal limits  URINALYSIS, ROUTINE W REFLEX MICROSCOPIC - Abnormal; Notable for the following components:   Color, Urine STRAW (*)    Hgb urine dipstick SMALL (*)    Leukocytes,Ua SMALL (*)    All other components within normal limits  RAPID URINE DRUG SCREEN, HOSP PERFORMED    EKG None  Radiology Dg Knee Complete 4 Views Left  Result Date: 04/09/2019 CLINICAL DATA:  Knee pain after twisting injury 1-2 weeks ago. EXAM: LEFT KNEE - COMPLETE 4+ VIEW COMPARISON:  None. FINDINGS: No evidence of fracture, dislocation, or joint effusion. No evidence of arthropathy or other focal bone abnormality. Soft tissues are unremarkable. IMPRESSION: Negative left knee radiographs. Electronically Signed   By: San Morelle M.D.   On: 04/09/2019 00:58    Procedures Procedures (including critical care time)  CRITICAL CARE Performed by: Larene Pickett   Total critical care time: 35 minutes  Critical care time was exclusive of separately billable procedures and treating other patients.  Critical care was necessary to treat or prevent imminent or life-threatening deterioration.  Critical care was time spent personally by me on the following activities: development of treatment plan with patient and/or surrogate as well as nursing, discussions with consultants, evaluation of patient's response to treatment, examination of patient, obtaining history from patient or surrogate, ordering and performing treatments and interventions, ordering and review of  laboratory studies, ordering and review of radiographic studies, pulse oximetry and re-evaluation of patient's condition.   Medications Ordered in ED Medications - No data to display   Initial Impression / Assessment and Plan / ED Course  I have reviewed the triage vital signs and the nursing notes.  Pertinent labs & imaging results that were available during my care of the patient were reviewed by me and considered in my medical decision making (see chart for details).  33 y.o. [id number], presenting to the ED for left knee pain.  States she twisted it 2 weeks ago but still with ongoing pain.  She is ambulatory here.  No focal weakness.  X-ray negative from triage.  Of note, patient is approx 9 months gestation (LMP 07/11/18).  She has had no prenatal care thus far in pregnancy.  BP on arrival was 145/105 during initial assessment.  She does have 2+ edema of the ankles.  Denies any headaches, dizziness, blurred vision, chest pain, or shortness of breath.  She does have history of preeclampsia in the past requiring antihypertensives.  Will check screening labs to include CBC, CMP, urinalysis.  Patient continues feeling fetal movement.  Denies vaginal bleeding, loss of fluid, or contractions.  4:09 AM BP's have trended down nicely without intervention-- last 5 or so readings in the 102'V systolic, now 253/66.  Labs are overall reassuring-- normal LFT's, platelets, no proteinuria.  Elevated BP may be due to pain, but still was quite high.  Will talk with OB-GYN for recommendations.  4:24 AM Discussed with Dr. Harolyn Rutherford-- recommends transfer to [facility]U for further evaluation given lack of prenatal care and elevated BP on arrival.  Further work-up and disposition per team at MAU.  UDS was added to labs.   Final Clinical Impressions(s) / ED Diagnoses   Final diagnoses:  Elevated blood pressure affecting pregnancy in third trimester, antepartum  No prenatal care in current  pregnancy in third trimester   Acute pain of left knee    ED Discharge Orders    None       Garlon HatchetSanders, Skyeler Scalese M, PA-C 04/09/19 0505    Gilda CreasePollina, Christopher J, MD 04/10/19 [phone]

## 2019-04-10 LAB — GC/CHLAMYDIA PROBE AMP (~~LOC~~) NOT AT ARMC
Chlamydia: NEGATIVE
Neisseria Gonorrhea: NEGATIVE

## 2019-04-11 LAB — CULTURE, BETA STREP (GROUP B ONLY)

## 2019-04-12 ENCOUNTER — Ambulatory Visit (HOSPITAL_COMMUNITY): Admission: RE | Admit: 2019-04-12 | Payer: Medicaid Other | Source: Ambulatory Visit

## 2019-04-12 ENCOUNTER — Ambulatory Visit (HOSPITAL_COMMUNITY): Payer: Medicaid Other

## 2019-04-12 ENCOUNTER — Ambulatory Visit (HOSPITAL_COMMUNITY): Payer: Medicaid Other | Attending: Obstetrics and Gynecology

## 2019-04-16 ENCOUNTER — Inpatient Hospital Stay (HOSPITAL_COMMUNITY)
Admission: AD | Admit: 2019-04-16 | Discharge: 2019-04-17 | DRG: 806 | Disposition: A | Discharge status: Home / Self Care | Payer: Medicaid Other | Attending: Obstetrics and Gynecology | Admitting: Obstetrics and Gynecology

## 2019-04-16 ENCOUNTER — Other Ambulatory Visit (HOSPITAL_COMMUNITY)
Admission: RE | Admit: 2019-04-16 | Discharge: 2019-04-16 | Disposition: A | Discharge status: Home / Self Care | Payer: Medicaid Other | Source: Ambulatory Visit

## 2019-04-16 ENCOUNTER — Other Ambulatory Visit: Payer: Self-pay

## 2019-04-16 ENCOUNTER — Inpatient Hospital Stay (HOSPITAL_COMMUNITY): Payer: Medicaid Other | Admitting: Anesthesiology

## 2019-04-16 ENCOUNTER — Encounter (HOSPITAL_COMMUNITY): Payer: Self-pay | Admitting: Family Medicine

## 2019-04-16 DIAGNOSIS — Z975 Presence of (intrauterine) contraceptive device: Secondary | ICD-10-CM

## 2019-04-16 DIAGNOSIS — F1120 Opioid dependence, uncomplicated: Secondary | ICD-10-CM | POA: Diagnosis present

## 2019-04-16 DIAGNOSIS — Z87891 Personal history of nicotine dependence: Secondary | ICD-10-CM | POA: Diagnosis not present

## 2019-04-16 DIAGNOSIS — O0930 Supervision of pregnancy with insufficient antenatal care, unspecified trimester: Secondary | ICD-10-CM

## 2019-04-16 DIAGNOSIS — O1404 Mild to moderate pre-eclampsia, complicating childbirth: Secondary | ICD-10-CM | POA: Diagnosis present

## 2019-04-16 DIAGNOSIS — O1490 Unspecified pre-eclampsia, unspecified trimester: Secondary | ICD-10-CM | POA: Diagnosis present

## 2019-04-16 DIAGNOSIS — F4329 Adjustment disorder with other symptoms: Secondary | ICD-10-CM | POA: Diagnosis present

## 2019-04-16 DIAGNOSIS — Z30017 Encounter for initial prescription of implantable subdermal contraceptive: Secondary | ICD-10-CM | POA: Diagnosis not present

## 2019-04-16 DIAGNOSIS — O9081 Anemia of the puerperium: Secondary | ICD-10-CM | POA: Diagnosis not present

## 2019-04-16 DIAGNOSIS — O26893 Other specified pregnancy related conditions, third trimester: Secondary | ICD-10-CM | POA: Diagnosis present

## 2019-04-16 DIAGNOSIS — O99324 Drug use complicating childbirth: Secondary | ICD-10-CM | POA: Diagnosis present

## 2019-04-16 DIAGNOSIS — Z3A36 36 weeks gestation of pregnancy: Secondary | ICD-10-CM

## 2019-04-16 DIAGNOSIS — Z8759 Personal history of other complications of pregnancy, childbirth and the puerperium: Secondary | ICD-10-CM | POA: Diagnosis present

## 2019-04-16 DIAGNOSIS — Z20828 Contact with and (suspected) exposure to other viral communicable diseases: Secondary | ICD-10-CM | POA: Diagnosis present

## 2019-04-16 DIAGNOSIS — I10 Essential (primary) hypertension: Secondary | ICD-10-CM | POA: Diagnosis present

## 2019-04-16 HISTORY — DX: Poisoning by 4-aminophenol derivatives, accidental (unintentional), initial encounter: T39.1X1A

## 2019-04-16 LAB — PROTEIN / CREATININE RATIO, URINE
Creatinine, Urine: 57.36 mg/dL
Protein Creatinine Ratio: 0.38 mg/mg{Cre} — ABNORMAL HIGH (ref 0.00–0.15)
Total Protein, Urine: 22 mg/dL

## 2019-04-16 LAB — TYPE AND SCREEN
ABO/RH(D): AB POS
Antibody Screen: NEGATIVE

## 2019-04-16 LAB — CBC
HCT: 29.7 % — ABNORMAL LOW (ref 36.0–46.0)
HCT: 27 % — ABNORMAL LOW (ref 36.0–46.0)
Hemoglobin: 9.7 g/dL — ABNORMAL LOW (ref 12.0–15.0)
Hemoglobin: 8.6 g/dL — ABNORMAL LOW (ref 12.0–15.0)
MCH: 28.9 pg (ref 26.0–34.0)
MCH: 28.7 pg (ref 26.0–34.0)
MCHC: 32.7 g/dL (ref 30.0–36.0)
MCHC: 31.9 g/dL (ref 30.0–36.0)
MCV: 88.4 fL (ref 80.0–100.0)
MCV: 90 fL (ref 80.0–100.0)
Platelets: 315 10*3/uL (ref 150–400)
Platelets: 293 10*3/uL (ref 150–400)
RBC: 3.36 MIL/uL — ABNORMAL LOW (ref 3.87–5.11)
RBC: 3 MIL/uL — ABNORMAL LOW (ref 3.87–5.11)
RDW: 14.1 % (ref 11.5–15.5)
RDW: 13.9 % (ref 11.5–15.5)
WBC: 12.6 10*3/uL — ABNORMAL HIGH (ref 4.0–10.5)
WBC: 16.4 10*3/uL — ABNORMAL HIGH (ref 4.0–10.5)
nRBC: 1.2 % — ABNORMAL HIGH (ref 0.0–0.2)
nRBC: 0.6 % — ABNORMAL HIGH (ref 0.0–0.2)

## 2019-04-16 LAB — COMPREHENSIVE METABOLIC PANEL
ALT: 8 U/L (ref 0–44)
AST: 15 U/L (ref 15–41)
Albumin: 2.4 g/dL — ABNORMAL LOW (ref 3.5–5.0)
Alkaline Phosphatase: 288 U/L — ABNORMAL HIGH (ref 38–126)
Anion gap: 9 (ref 5–15)
BUN: <5 mg/dL — ABNORMAL LOW (ref 6–20)
CO2: 22 mmol/L (ref 22–32)
Calcium: 8.5 mg/dL — ABNORMAL LOW (ref 8.9–10.3)
Chloride: 105 mmol/L (ref 98–111)
Creatinine, Ser: 0.52 mg/dL (ref 0.44–1.00)
GFR calc Af Amer: >60 mL/min (ref >60)
GFR calc non Af Amer: >60 mL/min (ref >60)
Glucose, Bld: 85 mg/dL (ref 70–99)
Potassium: 3.6 mmol/L (ref 3.5–5.1)
Sodium: 136 mmol/L (ref 135–145)
Total Bilirubin: 0.5 mg/dL (ref 0.3–1.2)
Total Protein: 6.5 g/dL (ref 6.5–8.1)

## 2019-04-16 LAB — SARS CORONAVIRUS 2 BY RT PCR (HOSPITAL ORDER, PERFORMED IN ~~LOC~~ HOSPITAL LAB): SARS Coronavirus 2: NEGATIVE

## 2019-04-16 LAB — POCT FERN TEST: POCT Fern Test: NEGATIVE

## 2019-04-16 MED ORDER — OXYCODONE-ACETAMINOPHEN 5-325 MG PO TABS: 1.0000 | ORAL_TABLET | ORAL | Status: DC | PRN

## 2019-04-16 MED ORDER — MEASLES, MUMPS & RUBELLA VAC IJ SOLR: 0.5000 mL | Freq: Once | INTRAMUSCULAR | Status: DC

## 2019-04-16 MED ORDER — FENTANYL-BUPIVACAINE-NACL 0.5-0.125-0.9 MG/250ML-% EP SOLN
12.0000 mL/h | EPIDURAL | Status: DC | PRN
  Filled 2019-04-16: qty 250

## 2019-04-16 MED ORDER — LIDOCAINE HCL (PF) 1 % IJ SOLN: 30.0000 mL | INTRAMUSCULAR | Status: DC | PRN

## 2019-04-16 MED ORDER — LACTATED RINGERS IV SOLN: INTRAVENOUS | Status: DC

## 2019-04-16 MED ORDER — FLEET ENEMA 7-19 GM/118ML RE ENEM: 1.0000 | ENEMA | RECTAL | Status: DC | PRN

## 2019-04-16 MED ORDER — OXYCODONE-ACETAMINOPHEN 5-325 MG PO TABS: 2.0000 | ORAL_TABLET | ORAL | Status: DC | PRN

## 2019-04-16 MED ORDER — DIPHENHYDRAMINE HCL 25 MG PO CAPS: 25.0000 mg | ORAL_CAPSULE | Freq: Four times a day (QID) | ORAL | Status: DC | PRN

## 2019-04-16 MED ORDER — OXYTOCIN 40 UNITS IN NORMAL SALINE INFUSION - SIMPLE MED
2.5000 [IU]/h | INTRAVENOUS | Status: DC
  Administered 2019-04-16: 16:00:00 2.5 [IU]/h via INTRAVENOUS
  Filled 2019-04-16: qty 1000

## 2019-04-16 MED ORDER — DIBUCAINE (PERIANAL) 1 % EX OINT: 1.0000 "application " | TOPICAL_OINTMENT | CUTANEOUS | Status: DC | PRN

## 2019-04-16 MED ORDER — WITCH HAZEL-GLYCERIN EX PADS: 1.0000 "application " | MEDICATED_PAD | CUTANEOUS | Status: DC | PRN

## 2019-04-16 MED ORDER — SIMETHICONE 80 MG PO CHEW: 80.0000 mg | CHEWABLE_TABLET | ORAL | Status: DC | PRN

## 2019-04-16 MED ORDER — PHENYLEPHRINE 40 MCG/ML (10ML) SYRINGE FOR IV PUSH (FOR BLOOD PRESSURE SUPPORT): 80.0000 ug | PREFILLED_SYRINGE | INTRAVENOUS | Status: DC | PRN

## 2019-04-16 MED ORDER — BUPRENORPHINE HCL 8 MG SL SUBL
8.0000 mg | SUBLINGUAL_TABLET | Freq: Two times a day (BID) | SUBLINGUAL | Status: DC
  Administered 2019-04-16 – 2019-04-17 (×3): 8 mg via SUBLINGUAL
  Filled 2019-04-16 (×3): qty 1

## 2019-04-16 MED ORDER — SODIUM CHLORIDE (PF) 0.9 % IJ SOLN
INTRAMUSCULAR | Status: DC | PRN
  Administered 2019-04-16: 12 mL/h via EPIDURAL

## 2019-04-16 MED ORDER — COCONUT OIL OIL: 1.0000 "application " | TOPICAL_OIL | Status: DC | PRN

## 2019-04-16 MED ORDER — SOD CITRATE-CITRIC ACID 500-334 MG/5ML PO SOLN: 30.0000 mL | ORAL | Status: DC | PRN

## 2019-04-16 MED ORDER — LACTATED RINGERS IV SOLN
INTRAVENOUS | Status: DC
  Administered 2019-04-16 (×2): via INTRAVENOUS

## 2019-04-16 MED ORDER — DIPHENHYDRAMINE HCL 50 MG/ML IJ SOLN: 12.5000 mg | INTRAMUSCULAR | Status: DC | PRN

## 2019-04-16 MED ORDER — SENNOSIDES-DOCUSATE SODIUM 8.6-50 MG PO TABS
2.0000 | ORAL_TABLET | ORAL | Status: DC
  Administered 2019-04-16: 2 via ORAL
  Filled 2019-04-16: qty 2

## 2019-04-16 MED ORDER — ACETAMINOPHEN 325 MG PO TABS: 650.0000 mg | ORAL_TABLET | ORAL | Status: DC | PRN

## 2019-04-16 MED ORDER — LACTATED RINGERS IV SOLN: 500.0000 mL | INTRAVENOUS | Status: DC | PRN

## 2019-04-16 MED ORDER — TETANUS-DIPHTH-ACELL PERTUSSIS 5-2.5-18.5 LF-MCG/0.5 IM SUSP: 0.5000 mL | Freq: Once | INTRAMUSCULAR | Status: DC

## 2019-04-16 MED ORDER — EPHEDRINE 5 MG/ML INJ: 10.0000 mg | INTRAVENOUS | Status: DC | PRN

## 2019-04-16 MED ORDER — ACETAMINOPHEN 500 MG PO TABS
1000.0000 mg | ORAL_TABLET | ORAL | Status: DC | PRN
  Administered 2019-04-16 – 2019-04-17 (×4): 1000 mg via ORAL
  Filled 2019-04-16 (×4): qty 2

## 2019-04-16 MED ORDER — OXYTOCIN BOLUS FROM INFUSION
500.0000 mL | Freq: Once | INTRAVENOUS | Status: AC
  Administered 2019-04-16: 16:00:00 500 mL via INTRAVENOUS

## 2019-04-16 MED ORDER — BENZOCAINE-MENTHOL 20-0.5 % EX AERO
1.0000 "application " | INHALATION_SPRAY | CUTANEOUS | Status: DC | PRN
  Administered 2019-04-16: 1 via TOPICAL
  Filled 2019-04-16: qty 56

## 2019-04-16 MED ORDER — ONDANSETRON HCL 4 MG PO TABS: 4.0000 mg | ORAL_TABLET | ORAL | Status: DC | PRN

## 2019-04-16 MED ORDER — ZOLPIDEM TARTRATE 5 MG PO TABS: 5.0000 mg | ORAL_TABLET | Freq: Every evening | ORAL | Status: DC | PRN

## 2019-04-16 MED ORDER — ONDANSETRON HCL 4 MG/2ML IJ SOLN: 4.0000 mg | Freq: Four times a day (QID) | INTRAMUSCULAR | Status: DC | PRN

## 2019-04-16 MED ORDER — LACTATED RINGERS IV SOLN: 500.0000 mL | Freq: Once | INTRAVENOUS | Status: DC

## 2019-04-16 MED ORDER — PRENATAL MULTIVITAMIN CH
1.0000 | ORAL_TABLET | Freq: Every day | ORAL | Status: DC
  Administered 2019-04-17: 12:00:00 1 via ORAL
  Filled 2019-04-16: qty 1

## 2019-04-16 MED ORDER — LIDOCAINE HCL (PF) 1 % IJ SOLN
INTRAMUSCULAR | Status: DC | PRN
  Administered 2019-04-16 (×2): 5 mL via EPIDURAL

## 2019-04-16 MED ORDER — ONDANSETRON HCL 4 MG/2ML IJ SOLN: 4.0000 mg | INTRAMUSCULAR | Status: DC | PRN

## 2019-04-16 MED ORDER — PHENYLEPHRINE 40 MCG/ML (10ML) SYRINGE FOR IV PUSH (FOR BLOOD PRESSURE SUPPORT)
80.0000 ug | PREFILLED_SYRINGE | INTRAVENOUS | Status: DC | PRN
  Filled 2019-04-16: qty 10

## 2019-04-16 NOTE — MAU Note (Signed)
Presents with c/o ctxs since 0500 this morning.  States also LOF, started @ 1000.  Reports +FM.  Also informed RN that baby for possible adoption.

## 2019-04-16 NOTE — Anesthesia Procedure Notes (Signed)
Epidural Patient location during procedure: OB Start time: 04/16/2019 1:19 PM End time: 04/16/2019 1:31 PM  Staffing Anesthesiologist: Lidia Collum, MD Performed: anesthesiologist   Preanesthetic Checklist Completed: patient identified, pre-op evaluation, timeout performed, IV checked, risks and benefits discussed and monitors and equipment checked  Epidural Patient position: sitting Prep: DuraPrep Patient monitoring: heart rate, continuous pulse ox and blood pressure Approach: midline Location: L3-L4 Injection technique: LOR air  Needle:  Needle type: Tuohy  Needle gauge: 17 G Needle length: 9 cm Needle insertion depth: 5 cm Catheter type: closed end flexible Catheter size: 19 Gauge Catheter at skin depth: 10 cm Test dose: negative  Assessment Events: blood not aspirated, injection not painful, no injection resistance, negative IV test and no paresthesia  Additional Notes Reason for block:procedure for pain

## 2019-04-16 NOTE — H&P (Addendum)
LABOR AND DELIVERY ADMISSION HISTORY AND PHYSICAL NOTE  Kristi Marshall is a 33 y.o. female [phone] with IUP at [redacted]w[redacted]d by LMP presenting for SOL.  She reports positive fetal movement. She denies leakage of fluid or vaginal bleeding.  Prenatal History/Complications: NO PNC Pregnancy complications:  - mild SIPE - No PNC - on Suboxone 8 mg BID for h/o substance abuse   Past Medical History: Past Medical History:  Diagnosis Date  . Acetaminophen toxicity   . Alcohol use 04/24/2017  . Anxiety   . Bronchitis   . Headache    migraines  . PIH (pregnancy induced hypertension)   . Pregnancy induced hypertension   . Pyelonephritis   . Seasonal allergies   . Seizure [facility])    pseudoseizures; stress related    Past Surgical History: Past Surgical History:  Procedure Laterality Date  . abortions    . DILATION AND CURETTAGE OF UTERUS     ? retained POC     Obstetrical History: OB History    Gravida  10   Para  6   Term  6   Preterm  0   AB  3   Living  6     SAB  0   TAB  3   Ectopic  0   Multiple  0   Live Births  6           Social History: Social History   Socioeconomic History  . Marital status: Single    Spouse name: Not on file  . Number of children: Not on file  . Years of education: Not on file  . Highest education level: Not on file  Occupational History  . Not on file  Social Needs  . Financial resource strain: Not hard at all  . Food insecurity    Worry: Never true    Inability: Never true  . Transportation needs    Medical: Yes    Non-medical: Not on file  Tobacco Use  . Smoking status: Former Smoker    Years: 7.00    Types: Cigarettes    Quit date: 05/24/2016    Years since quitting: 2.8  . Smokeless tobacco: Never Used  . Tobacco comment: Second hand smoke  Substance and Sexual Activity  . Alcohol use: Yes  . Drug use: No    Comment: Took Oycodone for years  . Sexual activity: Yes    Comment: last intercourse over a  month ago  Lifestyle  . Physical activity    Days per week: Not on file    Minutes per session: Not on file  . Stress: Only a little  Relationships  . Social Herbalist on phone: Not on file    Gets together: Not on file    Attends religious service: Not on file    Active member of club or organization: Not on file    Attends meetings of clubs or organizations: Not on file    Relationship status: Not on file  Other Topics Concern  . Not on file  Social History Narrative  . Not on file    Family History: Family History  Problem Relation Age of Onset  . Hypertension Mother   . Diabetes Maternal Aunt   . Hypertension Maternal Grandmother   . Diabetes Maternal Grandmother   . Heart disease Maternal Grandmother   . Hypertension Maternal Grandfather   . Heart disease Maternal Grandfather   . Anesthesia problems Neg Hx   .  Hypotension Neg Hx   . Malignant hyperthermia Neg Hx   . Pseudochol deficiency Neg Hx   . Other Neg Hx   . Hearing loss Neg Hx     Allergies: Allergies  Allergen Reactions  . Bactrim [Sulfamethoxazole-Trimethoprim] Nausea And Vomiting and Other (See Comments)    high fevers.   . Ibuprofen Itching  . Toradol [Ketorolac Tromethamine] Itching    Medications Prior to Admission  Medication Sig Dispense Refill Last Dose  . Prenatal Vit-Fe Fumarate-FA (MULTIVITAMIN-PRENATAL) 27-0.8 MG TABS tablet Take 1 tablet by mouth daily at 12 noon. 30 tablet 0 04/15/2019 at 1200  . SUBOXONE 8-2 MG FILM Place 1 Film under the tongue daily.    04/16/2019 at 0900  . docusate sodium (COLACE) 250 MG capsule Take 1 capsule (250 mg total) by mouth daily. 10 capsule 0   . ferrous sulfate 325 (65 FE) MG tablet Take 1 tablet (325 mg total) by mouth daily. 30 tablet 0 04/14/2019  . metoCLOPramide (REGLAN) 10 MG tablet Take 1 tablet (10 mg total) by mouth every 6 (six) hours as needed for nausea (nausea/headache). (Patient not taking: Reported on 04/09/2019) 6 tablet 0   .  metroNIDAZOLE (FLAGYL) 500 MG tablet Take 1 tablet (500 mg total) by mouth 2 (two) times daily. 14 tablet 0 04/09/2019     Review of Systems  All systems reviewed and negative except as stated in HPI  Physical Exam Blood pressure (!) 152/101, pulse 91, temperature 99.1 F (37.3 C), resp. rate 16, last menstrual period 07/05/2018, SpO2 100 %, unknown if currently breastfeeding. General appearance: alert, oriented Lungs: normal respiratory effort Heart: regular rate Abdomen: soft, non-tender; gravid, FH appropriate for GA Extremities: No calf swelling or tenderness Presentation: cephalic Fetal monitoring: 130 bpm moderate variability 15 x 15 acel no decels Uterine activity: 2-4 mins Dilation: 7.5 Effacement (%): 90 Exam by:: Bari Mantis. Lytle, RNC  Prenatal labs: ABO, Rh:   Antibody:   Rubella: 1.32 (07/27 0548) RPR: Non Reactive (07/27 0548)  HBsAg: Negative (07/27 0548)  HIV: Non Reactive (07/27 0548)  GC/Chlamydia: Negative (07/27 0000) GBS:  Negative 1-hr GTT: 127 Genetic screening: Low risk Anatomy US: N/A  Prenatal Transfer Tool  Maternal Diabetes: No Genetic Screening: Declined Maternal Ultrasounds/Referrals: Other:N/A Fetal Ultrasounds or other Referrals:  Other: N/A Maternal Substance Abuse:  Yes:  Type: Prescription drugs Significant Maternal Medications:  Meds include: Other: Suboxone Significant Maternal Lab Results: Group B Strep negative and Other:   Results for orders placed or performed during the hospital encounter of 04/16/19 (from the past 24 hour(s))  CBC   Collection Time: 04/16/19 12:35 PM  Result Value Ref Range   WBC 12.6 (H) 4.0 - 10.5 K/uL   RBC 3.36 (L) 3.87 - 5.11 MIL/uL   Hemoglobin 9.7 (L) 12.0 - 15.0 g/dL   HCT 09.829.7 (L) 11.936.0 - 14.746.0 %   MCV 88.4 80.0 - 100.0 fL   MCH 28.9 26.0 - 34.0 pg   MCHC 32.7 30.0 - 36.0 g/dL   RDW 82.914.1 56.211.5 - 13.015.5 %   Platelets 315 150 - 400 K/uL   nRBC 1.2 (H) 0.0 - 0.2 %  Fern Test   Collection Time: 04/16/19 12:51  PM  Result Value Ref Range   POCT Fern Test Negative = intact amniotic membranes     Patient Active Problem List   Diagnosis Date Noted  . Preeclampsia 04/16/2019  . Labor without complication 04/16/2019  . Adjustment disorder with emotional disturbance 04/26/2017  . HTN (hypertension)  04/24/2017  . Intentional drug overdose (HCC)   . Normal labor 01/14/2017  . Menorrhagia 07/31/2012  . THYROMEGALY 12/17/2009  . ALLERGIC RHINITIS 12/17/2009  . TOBACCO USER 05/24/2009  . SEIZURE DISORDER 12/20/2008    Assessment: Belva CromeBrittany S Meiser is a 33 y.o. [id number] at 2155w6d here for SOL.  #Labor: Expectant management. Continue cervical exams. #Pain: Epidural desire. Patient may have. #FWB: Cat I, Vertex #ID: GBS negative #MOF: Bottle #MOC: N/A #Circ: N/A  Lavonda JumboSimone Autry-Lott, DO Family Medicine Resident 04/16/2019, 12:52 PM  OB FELLOW HISTORY AND PHYSICAL ATTESTATION  I have seen and examined this patient; I agree with above documentation in the resident's note.   Marcy Sirenatherine Elberta Lachapelle, D.O. OB Fellow  04/16/2019, 4:29 PM

## 2019-04-16 NOTE — Anesthesia Preprocedure Evaluation (Signed)
Anesthesia Evaluation  Patient identified by MRN, date of birth, ID band Patient awake    Reviewed: Allergy & Precautions, H&P , NPO status , Patient's Chart, lab work & pertinent test results  History of Anesthesia Complications Negative for: history of anesthetic complications  Airway Mallampati: II  TM Distance: >3 FB Neck ROM: full    Dental no notable dental hx.    Pulmonary neg pulmonary ROS, former smoker,    Pulmonary exam normal        Cardiovascular hypertension (pre-eclampsia), Normal cardiovascular exam Rhythm:regular Rate:Normal     Neuro/Psych negative neurological ROS  negative psych ROS   GI/Hepatic negative GI ROS, (+)     substance abuse (past history; currently on suboxone)  ,   Endo/Other  negative endocrine ROS  Renal/GU negative Renal ROS  negative genitourinary   Musculoskeletal   Abdominal   Peds  Hematology negative hematology ROS (+)   Anesthesia Other Findings   Reproductive/Obstetrics (+) Pregnancy                             Anesthesia Physical Anesthesia Plan  ASA: II  Anesthesia Plan: Epidural   Post-op Pain Management:    Induction:   PONV Risk Score and Plan:   Airway Management Planned:   Additional Equipment:   Intra-op Plan:   Post-operative Plan:   Informed Consent: I have reviewed the patients History and Physical, chart, labs and discussed the procedure including the risks, benefits and alternatives for the proposed anesthesia with the patient or authorized representative who has indicated his/her understanding and acceptance.       Plan Discussed with:   Anesthesia Plan Comments:         Anesthesia Quick Evaluation

## 2019-04-16 NOTE — Progress Notes (Signed)
CSW aware of consult and met with MOB at bedside following delivery. CSW introduced self and asked MOB how she was feeling and she stated she was feeling ok, just tired. CSW inquired about MOB's adoption plan and MOB reported she has been talking with someone from [facility]. MOB shared that her phone is currently dead but once she is in a room she plans to charge it and reach out to [facility]. CSW confirmed that MOB's plan is to have infant remain in the nursery and that MOB does not wish to have contact with infant at this time. CSW advised MOB that CSW would follow up with MOB in the morning to which MOB expressed understanding. CSW will continue to follow.   Demico Ploch, LCSWA  [facility] [phone]  

## 2019-04-16 NOTE — Discharge Summary (Signed)
delete

## 2019-04-16 NOTE — Progress Notes (Signed)
Ambulated to bathroom and taught patient peri care.

## 2019-04-17 DIAGNOSIS — Z30017 Encounter for initial prescription of implantable subdermal contraceptive: Secondary | ICD-10-CM

## 2019-04-17 DIAGNOSIS — Z975 Presence of (intrauterine) contraceptive device: Secondary | ICD-10-CM

## 2019-04-17 LAB — COMPREHENSIVE METABOLIC PANEL
ALT: 7 U/L (ref 0–44)
AST: 18 U/L (ref 15–41)
Albumin: 2 g/dL — ABNORMAL LOW (ref 3.5–5.0)
Alkaline Phosphatase: 214 U/L — ABNORMAL HIGH (ref 38–126)
Anion gap: 11 (ref 5–15)
BUN: 5 mg/dL — ABNORMAL LOW (ref 6–20)
CO2: 24 mmol/L (ref 22–32)
Calcium: 8.5 mg/dL — ABNORMAL LOW (ref 8.9–10.3)
Chloride: 104 mmol/L (ref 98–111)
Creatinine, Ser: 0.64 mg/dL (ref 0.44–1.00)
GFR calc Af Amer: >60 mL/min (ref >60)
GFR calc non Af Amer: >60 mL/min (ref >60)
Glucose, Bld: 121 mg/dL — ABNORMAL HIGH (ref 70–99)
Potassium: 3.9 mmol/L (ref 3.5–5.1)
Sodium: 139 mmol/L (ref 135–145)
Total Bilirubin: 0.4 mg/dL (ref 0.3–1.2)
Total Protein: 5.3 g/dL — ABNORMAL LOW (ref 6.5–8.1)

## 2019-04-17 LAB — CBC
HCT: 25.7 % — ABNORMAL LOW (ref 36.0–46.0)
Hemoglobin: 8.2 g/dL — ABNORMAL LOW (ref 12.0–15.0)
MCH: 28 pg (ref 26.0–34.0)
MCHC: 31.9 g/dL (ref 30.0–36.0)
MCV: 87.7 fL (ref 80.0–100.0)
Platelets: 300 10*3/uL (ref 150–400)
RBC: 2.93 MIL/uL — ABNORMAL LOW (ref 3.87–5.11)
RDW: 13.8 % (ref 11.5–15.5)
WBC: 13.8 10*3/uL — ABNORMAL HIGH (ref 4.0–10.5)
nRBC: 0.7 % — ABNORMAL HIGH (ref 0.0–0.2)

## 2019-04-17 LAB — RPR: RPR Ser Ql: NONREACTIVE

## 2019-04-17 MED ORDER — POLYSACCHARIDE IRON COMPLEX 150 MG PO CAPS: 150.0000 mg | ORAL_CAPSULE | Freq: Every day | ORAL | Status: DC

## 2019-04-17 MED ORDER — DOCUSATE SODIUM 100 MG PO CAPS: 100.0000 mg | ORAL_CAPSULE | Freq: Two times a day (BID) | ORAL | Status: DC | PRN

## 2019-04-17 MED ORDER — ETONOGESTREL 68 MG ~~LOC~~ IMPL
68.0000 mg | DRUG_IMPLANT | Freq: Once | SUBCUTANEOUS | Status: AC
  Administered 2019-04-17: 12:00:00 68 mg via SUBCUTANEOUS
  Filled 2019-04-17: qty 1

## 2019-04-17 MED ORDER — LIDOCAINE HCL 1 % IJ SOLN
0.0000 mL | Freq: Once | INTRAMUSCULAR | Status: AC | PRN
  Administered 2019-04-17: 12:00:00 3 mL via INTRADERMAL
  Filled 2019-04-17: qty 20

## 2019-04-17 MED ORDER — ACETAMINOPHEN 500 MG PO TABS: 500.0000 mg | ORAL_TABLET | ORAL | 0 refills | Status: DC | PRN

## 2019-04-17 NOTE — Clinical Social Work Maternal (Signed)
CLINICAL SOCIAL WORK MATERNAL/CHILD NOTE  Patient Details  Name: Kristi Marshall MRN: [id number] Date of Birth: 1985-10-31  Date:  04/17/2019  Clinical Social Worker Initiating Note:  Laurey Arrow Date/Time: Initiated:  04/17/19/1011     Child's Name:  MOB has not named infant   Biological Parents:  Mother, Father(FOB is [name] age 32)   Need for Interpreter:  None   Reason for Referral:  Adoption, Behavioral Health Concerns, Current Substance Use/Substance Use During Pregnancy , Late or No Prenatal Care    Address:  [place] [address]    Phone number:  [phone] (FOB's number)    Additional phone number: MOB reports that her telephone number is [phone]  Household Members/Support Persons (HM/SP):   Household Member/Support Person 1, Household Member/Support Person 2, Household Member/Support Person 3, Household Member/Support Person 4, Household Member/Support Person 5, Household Member/Support Person 6   HM/SP Name Relationship DOB or Age  HM/SP -1 Nakiaya Mcduffey(MOB reports that child lives iwth her PMG) daughter 3 years old  HM/SP -2 Kristine Garbe Vancott(MOB reports that child lives with PGM) daughter 1 yrs old  HM/SP -3 Deasia Pustejovsky(MOB reports that child lives with paternal aunt.) daughter 54 yrs old  HM/SP -[place] son 8 yrs old  HM/SP -[place] daughter 2 yrs old  HM/SP -[place] daughter 51 yrs old  HM/SP -7        HM/SP -8          Natural Supports (not living in the home):  Extended Family, Immediate Family, Spouse/significant other, Parent   Professional Supports: Therapist(MOB receives outpatient counseling/medication managment at [facility])   Employment:     Type of Work:     Education:  9 to 11 years   Homebound arranged: No  Financial Resources:  [employer]   Other Resources:  Theatre stage manager Considerations Which May Impact Care:  Per [employer] Sheet, MOB is  Non-Denominational  Strengths:  Understanding of illness, Psychotropic Medications   Psychotropic Medications:  Subutex      Pediatrician:       Pediatrician List:   [place]      Pediatrician Fax Number:    Risk Factors/Current Problems:  Substance Use , Mental Health Concerns    Cognitive State:  Able to Concentrate , Alert , Linear Thinking , Insightful , Goal Oriented    Mood/Affect:  Interested , Comfortable , Relaxed , Calm     CSW Assessment: CSW met with MOB in room 105 to complete an assessment for an adoption, MH hx, and No PNC.  When CSW arrived, MOB was sitting in chair appearing prepared to meet with CSW.  CSW explained CSW's role and MOB was welcoming of CSW. MOB was polite, easy to engage, and appeared forthcoming.  MOB acknowledged meeting with a CSW in the past when MOB delivered her older children and shared that she is familiar with the assessment and process.   CSW asked about MOB's decision to complete an adoption plan and MOB stated, "I know I can't care for this child like I want to because I'm already struggling with my older children.  I had plan to have an abortion and by the time I was able to pay for it, it was too late." MOB shared that doing an adoption  is in the best interest for MOB and her family.  MOB communicated that she also has the support of FOB and MOB's immediately family regarding MOB making the decision to do an adoption plan. Per MOB, MOB has been working with Sara Norlette with [facility] and per MOB Sara will arrive today to complete all required adoption documents. CSW obtained required signatures from MOB to disclosed information to adoption agency. CSW also informed MOB of her rights as a birth mother.   CSW reviewed hospital's policy regarding no PNC and MOB was understanding. 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 Per MOB, MOB has been taking Subutex for 1 year, "And it has saved my life." CSW informed MOB that infant's UDS was negative and that CSW will continue to monitor infant's CDS and will make a report to [place] CPS if warranted. MOB acknowledged CPS hx and stated, "CPS was called in the past when I gave birth at the old hospital; a social worker came out to my house and my case was closed after about 45 days." MOB denied having an open case now and CSW confirmed it with [place] CPS.  CSW asked about MOB's MH hx and MOB denied and recent or current symptoms of depression.  Per MOB she had not had any symptoms in "Years."  However she acknowledged feeling anxious and having panic attacks.  MOB shared that her symptoms are managed by attending her outpatient session with her therapist at [facility]. CSW provided education regarding the baby blues period vs. perinatal mood disorders, discussed treatment and gave resources for mental health follow up if concerns arise.  CSW recommends self-evaluation during the postpartum time period using the New Mom Checklist from Postpartum Progress and encouraged MOB to contact a medical professional if symptoms are noted at any time.  MOB did not present with any acute symptoms and appeared to have insight and awareness.  CSW assessed for safety and MOB denied SI, HI, and DV.   3:30pm: CSW met with Adoption representative, MOB, and FOB at MOB's bedside.  All adoption documents were signed and placed in infant's chart.  MOB requested that CSW take photos of infant and save photos for MOB to pickup at a later date; CSW agreed.  Adoption agency will contact CSW tomorrow with information regarding adopting parents.   MOB's bedside nurse and infant's MD were updated.    CSW Plan/Description:  No Further Intervention Required/No Barriers to Discharge, Perinatal Mood and Anxiety Disorder (PMADs) Education, Other  Patient/Family Education, Hospital Drug Screen Policy Information, CSW Will Continue to Monitor Umbilical Cord Tissue Drug Screen Results and Make Report if Warranted    Boyd-Gilyard, MSW, LCSW Clinical Social Work [phone]    D BOYD-GILYARD, LCSW 04/17/2019, 12:22 PM  

## 2019-04-17 NOTE — Progress Notes (Signed)
Daily Post Partum Note  04/17/2019 Kristi Marshall is a 33 y.o. [id number] PPD#1 s/p  SVD/1st degree (not repaired)  @ [redacted]w[redacted]d  Pregnancy c/b preterm labor, no PNC, mild pre-eclampsia 24hr/overnight events:  none  Subjective:  No s/s of pre-eclampsia, meeting all PP goals.   Objective:   Vitals:   04/16/19 2007 04/16/19 2324 04/17/19 0433 04/17/19 0821  BP: 117/66 128/87 122/81 135/88  Pulse: 91 81 82 83  Resp: '16 18 16 16  ' Temp: 98 F (36.7 C) 98 F (36.7 C) 98.3 F (36.8 C) 98 F (36.7 C)  TempSrc: Oral Oral Oral Oral  SpO2: 99% 100% 100% 100%  Weight:      Height:        Current Vital Signs 24h Vital Sign Ranges  T 98 F (36.7 C) Temp  Avg: 98.2 F (36.8 C)  Min: 97.9 F (36.6 C)  Max: 99.1 F (37.3 C)  BP 135/88 BP  Min: 117/66  Max: 158/87  HR 83 Pulse  Avg: 91  Min: 79  Max: 109  RR 16 Resp  Avg: 17.9  Min: 16  Max: 20  SaO2 100 % Room Air SpO2  Avg: 99.9 %  Min: 99 %  Max: 100 %       24 Hour I/O Current Shift I/O  Time Ins Outs 08/03 0701 - 08/04 0700 In: 220 [P.O.:220] Out: 701 [Urine:200] No intake/output data recorded.    General: NAD Abdomen: soft, nttp. Perineum: deferre Skin:  Warm and dry.  Cardiovascular: S1, S2 normal, no murmur, rub or gallop, regular rate and rhythm Respiratory:  Clear to auscultation bilateral. Normal respiratory effort Extremities: no c/c/e  Medications Current Facility-Administered Medications  Medication Dose Route Frequency Provider Last Rate Last Dose  . acetaminophen (TYLENOL) tablet 1,000 mg  1,000 mg Oral Q4H PRN WNicolette Bang DO   1,000 mg at 04/17/19 00037 . acetaminophen (TYLENOL) tablet 650 mg  650 mg Oral Q4H PRN Autry-Lott, Simone, DO      . benzocaine-Menthol (DERMOPLAST) 20-0.5 % topical spray 1 application  1 application Topical PRN WNicolette Bang DO   1 application at [id number]  . buprenorphine (SUBUTEX) sublingual tablet 8 mg  8 mg Sublingual BID WNicolette Bang DO    8 mg at 04/17/19 01694 . coconut oil  1 application Topical PRN WNicolette Bang DO      . witch hazel-glycerin (TUCKS) pad 1 application  1 application Topical PRN WNicolette Bang DO       And  . dibucaine (NUPERCAINAL) 1 % rectal ointment 1 application  1 application Rectal PRN WNicolette Bang DO      . diphenhydrAMINE (BENADRYL) capsule 25 mg  25 mg Oral Q6H PRN WNicolette Bang DO      . docusate sodium (COLACE) capsule 100 mg  100 mg Oral BID PRN PAletha Halim MD      . etonogestrel (NEXPLANON) implant 68 mg  68 mg Subdermal Once PAletha Halim MD      . [Derrill MemoON 04/18/2019] iron polysaccharides (NIFEREX) capsule 150 mg  150 mg Oral Daily PAletha Halim MD      . lactated ringers infusion 500-1,000 mL  500-1,000 mL Intravenous PRN Autry-Lott, Simone, DO      . lidocaine (PF) (XYLOCAINE) 1 % injection 30 mL  30 mL Subcutaneous PRN Autry-Lott, Simone, DO      . lidocaine (XYLOCAINE) 1 % (with pres) injection 0-20 mL  0-20 mL Intradermal  Once PRN Aletha Halim, MD      . measles, mumps & rubella vaccine (MMR) injection 0.5 mL  0.5 mL Subcutaneous Once Nicolette Bang, DO      . ondansetron [facility]) injection 4 mg  4 mg Intravenous Q6H PRN Autry-Lott, Simone, DO      . ondansetron (ZOFRAN) tablet 4 mg  4 mg Oral Q4H PRN Nicolette Bang, DO       Or  . ondansetron [facility]) injection 4 mg  4 mg Intravenous Q4H PRN Nicolette Bang, DO      . oxyCODONE-acetaminophen (PERCOCET/ROXICET) 5-325 MG per tablet 1 tablet  1 tablet Oral Q4H PRN Autry-Lott, Simone, DO      . oxyCODONE-acetaminophen (PERCOCET/ROXICET) 5-325 MG per tablet 2 tablet  2 tablet Oral Q4H PRN Autry-Lott, Simone, DO      . oxytocin (PITOCIN) IV infusion 40 units in NS 1000 mL - Premix  2.5 Units/hr Intravenous Continuous Autry-Lott, Simone, DO 62.5 mL/hr at 04/16/19 1553 2.5 Units/hr at 04/16/19 1553  . prenatal multivitamin tablet 1 tablet  1 tablet  Oral Q1200 Nicolette Bang, DO      . senna-docusate (Senokot-S) tablet 2 tablet  2 tablet Oral Q24H Nicolette Bang, DO   2 tablet at 04/16/19 2333  . simethicone (MYLICON) chewable tablet 80 mg  80 mg Oral PRN Nicolette Bang, DO      . sodium citrate-citric acid (ORACIT) solution 30 mL  30 mL Oral Q2H PRN Autry-Lott, Simone, DO      . sodium phosphate (FLEET) 7-19 GM/118ML enema 1 enema  1 enema Rectal PRN Autry-Lott, Simone, DO      . Tdap (BOOSTRIX) injection 0.5 mL  0.5 mL Intramuscular Once Nicolette Bang, DO      . zolpidem (AMBIEN) tablet 5 mg  5 mg Oral QHS PRN Nicolette Bang, DO        Labs:  Recent Labs  Lab 04/16/19 1235 04/16/19 1800 04/17/19 0818  WBC 12.6* 16.4* 13.8*  HGB 9.7* 8.6* 8.2*  HCT 29.7* 27.0* 25.7*  PLT 315 293 300   Recent Labs  Lab 04/16/19 1235 04/17/19 0818  NA 136 139  K 3.6 3.9  CL 105 104  CO2 22 24  BUN <5* 5*  CREATININE 0.52 0.64  GLUCOSE 85 121*  CALCIUM 8.5* 8.5*    Assessment & Plan:  Pt doing well *Postpartum/postop: routine care *Social: giving baby up for adoption. SW following *Mild pre-x: request sent for 1wk bp and mood check to [facility] *Anemia: po iron *Suboxone: continue current meds. *Dispo: possibly later today if BPs continue to be good  AB POS / Rubella Immune / Varicella Unknown/  RPR negative / HIV negative / HepBsAg negative / Tdap UTD: no/Flu shot: no/ pap none (needed postpartum) / n/a  / Contraception: for inpatient nexplanon / Circ: not applicable/ Follow up: [facility]  Durene Romans MD Attending Center for [facility])

## 2019-04-17 NOTE — Discharge Summary (Signed)
  OB Discharge Summary     Patient Name: Kristi Marshall DOB: 01/30/1986 MRN: [id number]  Primary OBGYN: No prenatal care  Date of admission: 04/16/2019  Date of discharge: 04/17/2019   Admitting diagnosis: Preterm labor  Gestational age at admission: 36/6 Secondary diagnosis:  Mild pre-eclampsia, suboxone use     Discharge diagnosis: Term Pregnancy Delivered                                                                                                Post partum procedures:nexplanon placement  Augmentation: AROM  Complications: None  Delivering Clinician: WALLACE, CATHERINE LAUREN   Hospital course:  Routine labor and delivery and postpartum period. Patient followed by social work; pt gave child up for adoption Tdap: yes Rhogam: not applicable MMR: not applicable Anticipated Birth Control:  Nexplanon (placed inpatient) PP Procedures needed: pap smear  Schedule Integrated BH visit: yes Physical exam   Current Vital Signs 24h Vital Sign Ranges  T 98.1 F (36.7 C) Temp  Avg: 98.1 F (36.7 C)  Min: 98 F (36.7 C)  Max: 98.3 F (36.8 C)  BP 121/74 BP  Min: 117/66  Max: 137/69  HR 76 Pulse  Avg: 83.7  Min: 67  Max: 98  RR 16 Resp  Avg: 17  Min: 16  Max: 20  SaO2 99 % Room Air SpO2  Avg: 99.8 %  Min: 99 %  Max: 100 %       24 Hour I/O Current Shift I/O  Time Ins Outs 08/03 0701 - 08/04 0700 In: 220 [P.O.:220] Out: 701 [Urine:200] No intake/output data recorded.   Patient Vitals for the past 12 hrs:  BP Temp Temp src Pulse Resp SpO2  04/17/19 1507 121/74 98.1 F (36.7 C) Oral 76 16 99 %  04/17/19 1148 118/70 98 F (36.7 C) Oral 67 16 100 %  04/17/19 0821 135/88 98 F (36.7 C) Oral 83 16 100 %  04/17/19 0433 122/81 98.3 F (36.8 C) Oral 82 16 100 %    Patient Vitals for the past 6 hrs:  BP Temp Temp src Pulse Resp SpO2  04/17/19 1507 121/74 98.1 F (36.7 C) Oral 76 16 99 %  04/17/19 1148 118/70 98 F (36.7 C) Oral 67 16 100 %    Patient Vitals for the  past 24 hrs:  BP Temp Temp src Pulse Resp SpO2  04/17/19 1507 121/74 98.1 F (36.7 C) Oral 76 16 99 %  04/17/19 1148 118/70 98 F (36.7 C) Oral 67 16 100 %  04/17/19 0821 135/88 98 F (36.7 C) Oral 83 16 100 %  04/17/19 0433 122/81 98.3 F (36.8 C) Oral 82 16 100 %  04/16/19 2324 128/87 98 F (36.7 C) Oral 81 18 100 %  04/16/19 2007 117/66 98 F (36.7 C) Oral 91 16 99 %  04/16/19 1833 123/77 98 F (36.7 C) Axillary 98 16 100 %  04/16/19 1714 137/69 98 F (36.7 C) Oral 93 19 100 %  04/16/19 1631 122/61 - - 82 20 -    General: NAD Abdomen: firm fundus below the umbilicus.   NTTP Pulmonary: CTAB CV: S1, S2 normal, no murmur, rub or gallop, regular rate and rhythm  Labs: AB POS//1.32 (07/27 0548)//Non Reactive (08/03 1235)//Non Reactive (07/27 0548)///Negative (07/27 0548)  CBC Latest Ref Rng & Units 04/17/2019 04/16/2019 04/16/2019  WBC 4.0 - 10.5 K/uL 13.8(H) 16.4(H) 12.6(H)  Hemoglobin 12.0 - 15.0 g/dL 8.2(L) 8.6(L) 9.7(L)  Hematocrit 36.0 - 46.0 % 25.7(L) 27.0(L) 29.7(L)  Platelets 150 - 400 K/uL 300 293 315   CMP Latest Ref Rng & Units 04/17/2019 04/16/2019 04/09/2019  Glucose 70 - 99 mg/dL 121(H) 85 134(H)  BUN 6 - 20 mg/dL 5(L) <5(L) 7  Creatinine 0.44 - 1.00 mg/dL 0.64 0.52 0.63  Sodium 135 - 145 mmol/L 139 136 137  Potassium 3.5 - 5.1 mmol/L 3.9 3.6 3.5  Chloride 98 - 111 mmol/L 104 105 105  CO2 22 - 32 mmol/L 24 22 20(L)  Calcium 8.9 - 10.3 mg/dL 8.5(L) 8.5(L) 8.6(L)  Total Protein 6.5 - 8.1 g/dL 5.3(L) 6.5 5.9(L)  Total Bilirubin 0.3 - 1.2 mg/dL 0.4 0.5 0.5  Alkaline Phos 38 - 126 U/L 214(H) 288(H) 260(H)  AST 15 - 41 U/L 18 15 16  ALT 0 - 44 U/L 7 8 9     Discharge instruction: per After Visit Summary and "Baby and Me Booklet".  After visit meds:  Allergies as of 04/17/2019      Reactions   Bactrim [sulfamethoxazole-trimethoprim] Nausea And Vomiting, Other (See Comments)   high fevers.    Ibuprofen Itching   Toradol [ketorolac Tromethamine] Itching       Medication List    STOP taking these medications   metroNIDAZOLE 500 MG tablet Commonly known as: FLAGYL     TAKE these medications   acetaminophen 500 MG tablet Commonly known as: TYLENOL Take 1 tablet (500 mg total) by mouth every 4 (four) hours as needed (for pain scale < 4).   docusate sodium 250 MG capsule Commonly known as: COLACE Take 1 capsule (250 mg total) by mouth daily.   ferrous sulfate 325 (65 FE) MG tablet Take 1 tablet (325 mg total) by mouth daily.   metoCLOPramide 10 MG tablet Commonly known as: Reglan Take 1 tablet (10 mg total) by mouth every 6 (six) hours as needed for nausea (nausea/headache).   multivitamin-prenatal 27-0.8 MG Tabs tablet Take 1 tablet by mouth daily at 12 noon.   Suboxone 8-2 MG Film Generic drug: Buprenorphine HCl-Naloxone HCl Place 1 Film under the tongue daily.       Diet: routine diet  Activity: Advance as tolerated. Pelvic rest for 6 weeks.   Outpatient follow up:1 week Follow up Appt: Future Appointments  Date Time Provider Department Center  04/24/2019  1:15 PM WOC-BEHAVIORAL HEALTH CLINICIAN [facility]  04/24/2019  3:00 PM [facility] NURSE [facility]  05/15/2019  3:35 PM Nugent, Nicole E, NP [facility]    Newborn Data: Live born female  Birth Weight: 8 lb 0.4 oz (3640 g) APGAR: 8, 9  Newborn Delivery   Birth date/time: 04/16/2019 15:26:00 Delivery type: Vaginal, Spontaneous      Baby Feeding: N/A Disposition:Adoption   04/17/2019  , MD    

## 2019-04-17 NOTE — Procedures (Signed)
Nexplanon Insertion Procedure Note Prior to the procedure being performed, the patient was asked to state their full name, date of birth, type of procedure being performed and the exact location of the operative site. This information was then checked against the documentation in the patient's chart. Prior to the procedure being performed, a "time out" was performed by the physician that confirmed the correct patient, procedure and site.  After informed consent was obtained, the patient's non-dominant left arm was chosen for insertion. A site was marked approximately 8 cm proximal to the medial epicondyle in the sulcus between the biceps and triceps on the inner surface. The area was cleaned with alcohol then local anesthesia was infiltrated with 3 ml of 1% lidocaine along the planned insertion track. The area was prepped with betadine. Using sterile technique the Nexplanon device was inserted per manufacturer's guidelines in the subdermal connective tissue using the standard insertion technique without difficulty. Pressure was applied and the insertion site was hemostatic. The presence of the Nexplanon was confirmed immediately after insertion by palpation by both me and the patient and by checking the tip of needle for the absence of the insert.  A pressure dressing was applied.  Lot #: [id number] Exp: Jul 14, 2021  The patient tolerated the procedure well.  Durene Romans MD Attending Center for [employer] farm [employer])

## 2019-04-17 NOTE — Progress Notes (Signed)
Discharge instructions given to pt. Discussed post-vaginal delivery care, signs and symptoms to report to the MD, postpartum baby blues, pre-eclampsia, upcoming appointments, and meds. Pt verbalizes understanding and has no questions or concerns at this time. Spoke with Glenard Haring, the Education officer, museum, and patient is cleared for discharge. Pt discharged from hospital in stable condition.

## 2019-04-17 NOTE — Discharge Instructions (Addendum)
Postpartum Baby Blues The postpartum period begins right after the birth of a baby. During this time, there is often a lot of joy and excitement. It is also a time of many changes in the life of the parents. No matter how many times a mother gives birth, each child brings new challenges to the family, including different ways of relating to one another. It is common to have feelings of excitement along with confusing changes in moods, emotions, and thoughts. You may feel happy one minute and sad or stressed the next. These feelings of sadness usually happen in the period right after you have your baby, and they go away within a week or two. This is called the "baby blues." What are the causes? There is no known cause of baby blues. It is likely caused by a combination of factors. However, changes in hormone levels after childbirth are believed to trigger some of the symptoms. Other factors that can play a role in these mood changes include:  Lack of sleep.  Stressful life events, such as poverty, caring for a loved one, or death of a loved one.  Genetics. What are the signs or symptoms? Symptoms of this condition include:  Brief changes in mood, such as going from extreme happiness to sadness.  Decreased concentration.  Difficulty sleeping.  Crying spells and tearfulness.  Loss of appetite.  Irritability.  Anxiety. If the symptoms of baby blues last for more than 2 weeks or become more severe, you may have postpartum depression. How is this diagnosed? This condition is diagnosed based on an evaluation of your symptoms. There are no medical or lab tests that lead to a diagnosis, but there are various questionnaires that a health care provider may use to identify women with the baby blues or postpartum depression. How is this treated? Treatment is not needed for this condition. The baby blues usually go away on their own in 1-2 weeks. Social support is often all that is needed. You will  be encouraged to get adequate sleep and rest. Follow these instructions at home: Lifestyle      Get as much rest as you can. Take a nap when the baby sleeps.  Exercise regularly as told by your health care provider. Some women find yoga and walking to be helpful.  Eat a balanced and nourishing diet. This includes plenty of fruits and vegetables, whole grains, and lean proteins.  Do little things that you enjoy. Have a cup of tea, take a bubble bath, read your favorite magazine, or listen to your favorite music.  Avoid alcohol.  Ask for help with household chores, cooking, grocery shopping, or running errands. Do not try to do everything yourself. Consider hiring a postpartum doula to help. This is a professional who specializes in providing support to new mothers.  Try not to make any major life changes during pregnancy or right after giving birth. This can add stress. General instructions  Talk to people close to you about how you are feeling. Get support from your partner, family members, friends, or other new moms. You may want to join a support group.  Find ways to cope with stress. This may include: ? Writing your thoughts and feelings in a journal. ? Spending time outside. ? Spending time with people who make you laugh.  Try to stay positive in how you think. Think about the things you are grateful for.  Take over-the-counter and prescription medicines only as told by your health care provider.    Let your health care provider know if you have any concerns.  Keep all postpartum visits as told by your health care provider. This is important. Contact a health care provider if:  Your baby blues do not go away after 2 weeks. Get help right away if:  You have thoughts of taking your own life (suicidal thoughts).  You think you may harm the baby or other people.  You see or hear things that are not there (hallucinations). Summary  After giving birth, you may feel happy  one minute and sad or stressed the next. Feelings of sadness that happen right after the baby is born and go away after a week or two are called the "baby blues."  You can manage the baby blues by getting enough rest, eating a healthy diet, exercising, spending time with supportive people, and finding ways to cope with stress.  If feelings of sadness and stress last longer than 2 weeks or get in the way of caring for your baby, talk to your health care provider. This may mean you have postpartum depression. This information is not intended to replace advice given to you by your health care provider. Make sure you discuss any questions you have with your health care provider. Document Released: 06/03/2004 Document Revised: 12/22/2018 Document Reviewed: 10/26/2016 Elsevier Patient Education  2020 [employer] Hypertension During Pregnancy Hypertension is also called high blood pressure. High blood pressure means that the force of your blood moving in your body is too strong. It can cause problems for you and your baby. Different types of high blood pressure can happen during pregnancy. The types are:  High blood pressure before you got pregnant. This is called chronic hypertension.  This can continue during your pregnancy. Your doctor will want to keep checking your blood pressure. You may need medicine to keep your blood pressure under control while you are pregnant. You will need follow-up visits after you have your baby.  High blood pressure that goes up during pregnancy when it was normal before. This is called gestational hypertension. It will usually get better after you have your baby, but your doctor will need to watch your blood pressure to make sure that it is getting better.  Very high blood pressure during pregnancy. This is called preeclampsia. Very high blood pressure is an emergency that needs to be checked and treated right away.  You may develop very high blood pressure after giving  birth. This is called postpartum preeclampsia. This usually occurs within 48 hours after childbirth but may occur up to 6 weeks after giving birth. This is rare. How does this affect me? If you have high blood pressure during pregnancy, you have a higher chance of developing high blood pressure:  As you get older.  If you get pregnant again. In some cases, high blood pressure during pregnancy can cause:  Stroke.  Heart attack.  Damage to the kidneys, lungs, or liver.  Preeclampsia.  Jerky movements you cannot control (convulsions or seizures).  Problems with the placenta. How does this affect my baby? Your baby may:  Be born early.  Not weigh as much as he or she should.  Not handle labor well, leading to a c-section birth. What are the risks?  Having high blood pressure during a past pregnancy.  Being overweight.  Being 32 years old or older.  Being pregnant for the first time.  Being pregnant with more than one baby.  Becoming pregnant using fertility methods, such as IVF.  Having other problems, such as diabetes, or kidney disease.  Having family members who have high blood pressure. What can I do to lower my risk?   Keep a healthy weight.  Eat a healthy diet.  Follow what your doctor tells you about treating any medical problems that you had before becoming pregnant. It is very important to go to all of your doctor visits. Your doctor will check your blood pressure and make sure that your pregnancy is progressing as it should. Treatment should start early if a problem is found. How is this treated? Treatment for high blood pressure during pregnancy can differ depending on the type of high blood pressure you have and how serious it is.  You may need to take blood pressure medicine.  If you have been taking medicine for your blood pressure, you may need to change the medicine during pregnancy if it is not safe for your baby.  If your doctor thinks that  you could get very high blood pressure, he or she may tell you to take a low-dose aspirin during your pregnancy.  If you have very high blood pressure, you may need to stay in the hospital so you and your baby can be watched closely. You may also need to take medicine to lower your blood pressure. This medicine may be given by mouth or through an IV tube.  In some cases, if your condition gets worse, you may need to have your baby early. Follow these instructions at home: Eating and drinking   Drink enough fluid to keep your pee (urine) pale yellow.  Avoid caffeine. Lifestyle  Do not use any products that contain nicotine or tobacco, such as cigarettes, e-cigarettes, and chewing tobacco. If you need help quitting, ask your doctor.  Do not use alcohol or drugs.  Avoid stress.  Rest and get plenty of sleep.  Regular exercise can help. Ask your doctor what kinds of exercise are best for you. General instructions  Take over-the-counter and prescription medicines only as told by your doctor.  Keep all prenatal and follow-up visits as told by your doctor. This is important. Contact a doctor if:  You have symptoms that your doctor told you to watch for, such as: ? Headaches. ? Nausea. ? Vomiting. ? Belly (abdominal) pain. ? Dizziness. ? Light-headedness. Get help right away if:  You have: ? Very bad belly pain that does not get better with treatment. ? A very bad headache that does not get better. ? Vomiting that does not get better. ? Sudden, fast weight gain. ? Sudden swelling in your hands, ankles, or face.  ? Blurry vision. ? Double vision. ? Shortness of breath. ? Chest pain. ? Weakness on one side of your body. ? Trouble talking.  Your baby is not moving as much as usual. Summary  High blood pressure is also called hypertension.  High blood pressure means that the force of your blood moving in your body is too strong.  High blood pressure can cause problems  for you and your baby.  Keep all follow-up visits as told by your doctor. This is important. This information is not intended to replace advice given to you by your health care provider. Make sure you discuss any questions you have with your health care provider. Document Released: 10/02/2010 Document Revised: 12/21/2018 Document Reviewed: 09/26/2018 Elsevier Patient Education  2020 [employer].

## 2019-04-18 ENCOUNTER — Inpatient Hospital Stay (HOSPITAL_COMMUNITY)
Admission: AD | Admit: 2019-04-18 | Payer: Medicaid Other | Source: Home / Self Care | Admitting: Obstetrics & Gynecology

## 2019-04-18 ENCOUNTER — Inpatient Hospital Stay (HOSPITAL_COMMUNITY): Payer: Medicaid Other

## 2019-04-18 NOTE — Anesthesia Postprocedure Evaluation (Signed)
Anesthesia Post Note  Patient: Kristi Marshall  Procedure(s) Performed: AN AD HOC LABOR EPIDURAL     Patient location during evaluation: Mother Baby Anesthesia Type: Epidural Level of consciousness: awake and alert Pain management: pain level controlled Vital Signs Assessment: post-procedure vital signs reviewed and stable Respiratory status: spontaneous breathing and respiratory function stable Cardiovascular status: blood pressure returned to baseline Postop Assessment: no headache, no backache and able to ambulate Anesthetic complications: no    Lidia Collum

## 2019-04-19 NOTE — BH Specialist Note (Signed)
Pt did not arrive to [facility] meeting and did not answer the phone; Left HIPPA-compliant message to call back Roselyn Reef from [facility] [employer] at [phone].   [place] via Telemedicine Video Visit  04/19/2019 [place] DOST [phone]  Garlan Fair

## 2019-04-24 ENCOUNTER — Ambulatory Visit: Payer: Medicaid Other

## 2019-04-24 ENCOUNTER — Ambulatory Visit: Payer: Medicaid Other | Admitting: Clinical

## 2019-04-24 ENCOUNTER — Other Ambulatory Visit: Payer: Self-pay

## 2019-04-24 DIAGNOSIS — Z91199 Patient's noncompliance with other medical treatment and regimen due to unspecified reason: Secondary | ICD-10-CM

## 2019-04-24 DIAGNOSIS — Z5329 Procedure and treatment not carried out because of patient's decision for other reasons: Secondary | ICD-10-CM

## 2019-05-15 ENCOUNTER — Encounter: Payer: Self-pay | Admitting: Family Medicine

## 2019-05-15 ENCOUNTER — Ambulatory Visit: Payer: Medicaid Other | Admitting: Women's Health

## 2020-11-26 ENCOUNTER — Ambulatory Visit: Payer: Medicaid Other | Admitting: Internal Medicine

## 2020-11-26 NOTE — Progress Notes (Deleted)
Office Visit Note  Patient: Kristi Marshall             Date of Birth: 1986-02-02           MRN: [id number]             PCP: Simona Huh, NP Referring: Simona Huh, NP Visit Date: 11/26/2020 Occupation: _0 @  Subjective:  No chief complaint on file.   History of Present Illness: Kristi Marshall is a 35 y.o. female with a history of vitamin D deficiency, osteoarthritis, fibromyalgia, chronic pain here for evaluation of inflammatory arthritis.***   Labs reviewed ANA neg RF neg CRP 2 ESR 33 Uric acid 3.6  Imaging reviewed 09/2020 Xray bilateral elbows No significant arthritis reported  Xray left knee No significant arthritis reported  Lumbar spine No significant arthritis reported   Activities of Daily Living:  Patient reports morning stiffness for *** {minute/hour:19697}.   Patient {ACTIONS;DENIES/REPORTS:21021675::"Denies"} nocturnal pain.  Difficulty dressing/grooming: {ACTIONS;DENIES/REPORTS:21021675::"Denies"} Difficulty climbing stairs: {ACTIONS;DENIES/REPORTS:21021675::"Denies"} Difficulty getting out of chair: {ACTIONS;DENIES/REPORTS:21021675::"Denies"} Difficulty using hands for taps, buttons, cutlery, and/or writing: {ACTIONS;DENIES/REPORTS:21021675::"Denies"}  No Rheumatology ROS completed.   PMFS History:  Patient Active Problem List   Diagnosis Date Noted  . Nexplanon in place 04/17/2019  . Preeclampsia 04/16/2019  . Labor without complication 70/96/2836  . No prenatal care in current pregnancy 04/16/2019  . SVD (spontaneous vaginal delivery) 04/16/2019  . Adjustment disorder with emotional disturbance 04/26/2017  . HTN (hypertension) 04/24/2017  . Intentional drug overdose ([place])   . Normal labor 01/14/2017  . Menorrhagia 07/31/2012  . THYROMEGALY 12/17/2009  . ALLERGIC RHINITIS 12/17/2009  . TOBACCO USER 05/24/2009  . SEIZURE DISORDER 12/20/2008    Past Medical History:  Diagnosis Date  . Acetaminophen toxicity   . Alcohol  use 04/24/2017  . Anxiety   . Bronchitis   . Headache    migraines  . PIH (pregnancy induced hypertension)   . Pregnancy induced hypertension   . Pyelonephritis   . Seasonal allergies   . 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 Hypertension Mother   . Diabetes Maternal Aunt   . Hypertension Maternal Grandmother   . Diabetes Maternal Grandmother   . Heart disease Maternal Grandmother   . Hypertension Maternal Grandfather   . Heart disease Maternal Grandfather   . Anesthesia problems Neg Hx   . Hypotension Neg Hx   . Malignant hyperthermia Neg Hx   . Pseudochol deficiency Neg Hx   . Other Neg Hx   . Hearing loss Neg Hx    Past Surgical History:  Procedure Laterality Date  . abortions    . DILATION AND CURETTAGE OF UTERUS     ? retained POC    Social History   Social History Narrative  . Not on file   Immunization History  Administered Date(s) Administered  . Tdap 05/20/2012     Objective: Vital Signs: There were no vitals taken for this visit.   Physical Exam   Musculoskeletal Exam: ***  CDAI Exam: CDAI Score: -- Patient Global: --; Provider Global: -- Swollen: --; Tender: -- Joint Exam 11/26/2020   No joint exam has been documented for this visit   There is currently no information documented on the homunculus. Go to the Rheumatology activity and complete the homunculus joint exam.  Investigation: No additional findings.  Imaging: No results found.  Recent Labs: Lab Results  Component Value Date   WBC 13.8 (  H) 04/17/2019   HGB 8.2 (L) 04/17/2019   PLT 300 04/17/2019   NA 139 04/17/2019   K 3.9 04/17/2019   CL 104 04/17/2019   CO2 24 04/17/2019   GLUCOSE 121 (H) 04/17/2019   BUN 5 (L) 04/17/2019   CREATININE 0.64 04/17/2019   BILITOT 0.4 04/17/2019   ALKPHOS 214 (H) 04/17/2019   AST 18 04/17/2019   ALT 7 04/17/2019   PROT 5.3 (L) 04/17/2019   ALBUMIN 2.0 (L) 04/17/2019    CALCIUM 8.5 (L) 04/17/2019   GFRAA >60 04/17/2019    Speciality Comments: No specialty comments available.  Procedures:  No procedures performed Allergies: Bactrim [sulfamethoxazole-trimethoprim], Ibuprofen, and Toradol [ketorolac tromethamine]   Assessment / Plan:     Visit Diagnoses: No diagnosis found.  Orders: No orders of the defined types were placed in this encounter.  No orders of the defined types were placed in this encounter.   Face-to-face time spent with patient was *** minutes. Greater than 50% of time was spent in counseling and coordination of care.  Follow-Up Instructions: No follow-ups on file.   Collier Salina, MD  Note - This record has been created using [employer].  Chart creation errors have been sought, but may not always  have been located. Such creation errors do not reflect on  the standard of medical care.

## 2021-02-17 NOTE — Progress Notes (Deleted)
Office Visit Note  Patient: Kristi Marshall             Date of Birth: [birth year]           MRN: [id number]             PCP: Courtney Paris, NP Referring: Courtney Paris, NP Visit Date: 02/18/2021 Occupation: @GUAROCC @  Subjective:  No chief complaint on file.   History of Present Illness: Kristi Marshall is a 35 y.o. female here for evaluation of inflammatory arthritis.***   Activities of Daily Living:  Patient reports morning stiffness for *** {minute/hour:19697}.   Patient {ACTIONS;DENIES/REPORTS:21021675::"Denies"} nocturnal pain.  Difficulty dressing/grooming: {ACTIONS;DENIES/REPORTS:21021675::"Denies"} Difficulty climbing stairs: {ACTIONS;DENIES/REPORTS:21021675::"Denies"} Difficulty getting out of chair: {ACTIONS;DENIES/REPORTS:21021675::"Denies"} Difficulty using hands for taps, buttons, cutlery, and/or writing: {ACTIONS;DENIES/REPORTS:21021675::"Denies"}  No Rheumatology ROS completed.   PMFS History:  Patient Active Problem List   Diagnosis Date Noted  . Nexplanon in place 04/17/2019  . Preeclampsia 04/16/2019  . Labor without complication 04/16/2019  . No prenatal care in current pregnancy 04/16/2019  . SVD (spontaneous vaginal delivery) 04/16/2019  . Adjustment disorder with emotional disturbance 04/26/2017  . HTN (hypertension) 04/24/2017  . Intentional drug overdose ([facility])   . Normal labor 01/14/2017  . Menorrhagia 07/31/2012  . THYROMEGALY 12/17/2009  . ALLERGIC RHINITIS 12/17/2009  . TOBACCO USER 05/24/2009  . SEIZURE DISORDER 12/20/2008    Past Medical History:  Diagnosis Date  . Acetaminophen toxicity   . Alcohol use 04/24/2017  . Anxiety   . Bronchitis   . Headache    migraines  . PIH (pregnancy induced hypertension)   . Pregnancy induced hypertension   . Pyelonephritis   . Seasonal allergies   . 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 Hypertension Mother   . Diabetes  Maternal Aunt   . Hypertension Maternal Grandmother   . Diabetes Maternal Grandmother   . Heart disease Maternal Grandmother   . Hypertension Maternal Grandfather   . Heart disease Maternal Grandfather   . Anesthesia problems Neg Hx   . Hypotension Neg Hx   . Malignant hyperthermia Neg Hx   . Pseudochol deficiency Neg Hx   . Other Neg Hx   . Hearing loss Neg Hx    Past Surgical History:  Procedure Laterality Date  . abortions    . DILATION AND CURETTAGE OF UTERUS     ? retained POC    Social History   Social History Narrative  . Not on file   Immunization History  Administered Date(s) Administered  . Tdap 05/20/2012     Objective: Vital Signs: There were no vitals taken for this visit.   Physical Exam   Musculoskeletal Exam: ***  CDAI Exam: CDAI Score: -- Patient Global: --; Provider Global: -- Swollen: --; Tender: -- Joint Exam 02/18/2021   No joint exam has been documented for this visit   There is currently no information documented on the homunculus. Go to the Rheumatology activity and complete the homunculus joint exam.  Investigation: No additional findings.  Imaging: No results found.  Recent Labs: Lab Results  Component Value Date   WBC 13.8 (H) 04/17/2019   HGB 8.2 (L) 04/17/2019   PLT 300 04/17/2019   NA 139 04/17/2019   K 3.9 04/17/2019   CL 104 04/17/2019   CO2 24 04/17/2019   GLUCOSE 121 (H) 04/17/2019   BUN 5 (L) 04/17/2019   CREATININE 0.64 04/17/2019  BILITOT 0.4 04/17/2019   ALKPHOS 214 (H) 04/17/2019   AST 18 04/17/2019   ALT 7 04/17/2019   PROT 5.3 (L) 04/17/2019   ALBUMIN 2.0 (L) 04/17/2019   CALCIUM 8.5 (L) 04/17/2019   GFRAA >60 04/17/2019    Speciality Comments: No specialty comments available.  Procedures:  No procedures performed Allergies: Bactrim [sulfamethoxazole-trimethoprim], Ibuprofen, and Toradol [ketorolac tromethamine]   Assessment / Plan:     Visit Diagnoses: No diagnosis found.  Orders: No orders  of the defined types were placed in this encounter.  No orders of the defined types were placed in this encounter.   Face-to-face time spent with patient was *** minutes. Greater than 50% of time was spent in counseling and coordination of care.  Follow-Up Instructions: No follow-ups on file.   Fuller Plan, MD  Note - This record has been created using [employer].  Chart creation errors have been sought, but may not always  have been located. Such creation errors do not reflect on  the standard of medical care.

## 2021-02-18 ENCOUNTER — Ambulatory Visit: Payer: Medicaid Other | Admitting: Internal Medicine

## 2021-02-19 IMAGING — DX LEFT KNEE - COMPLETE 4+ VIEW
4 series · 4 of 4 positions shown · non-contrast
Comparison: None.

CLINICAL DATA: Knee pain after twisting injury 1-2 weeks ago.

EXAM:
LEFT KNEE - COMPLETE 4+ VIEW

[knee ap]
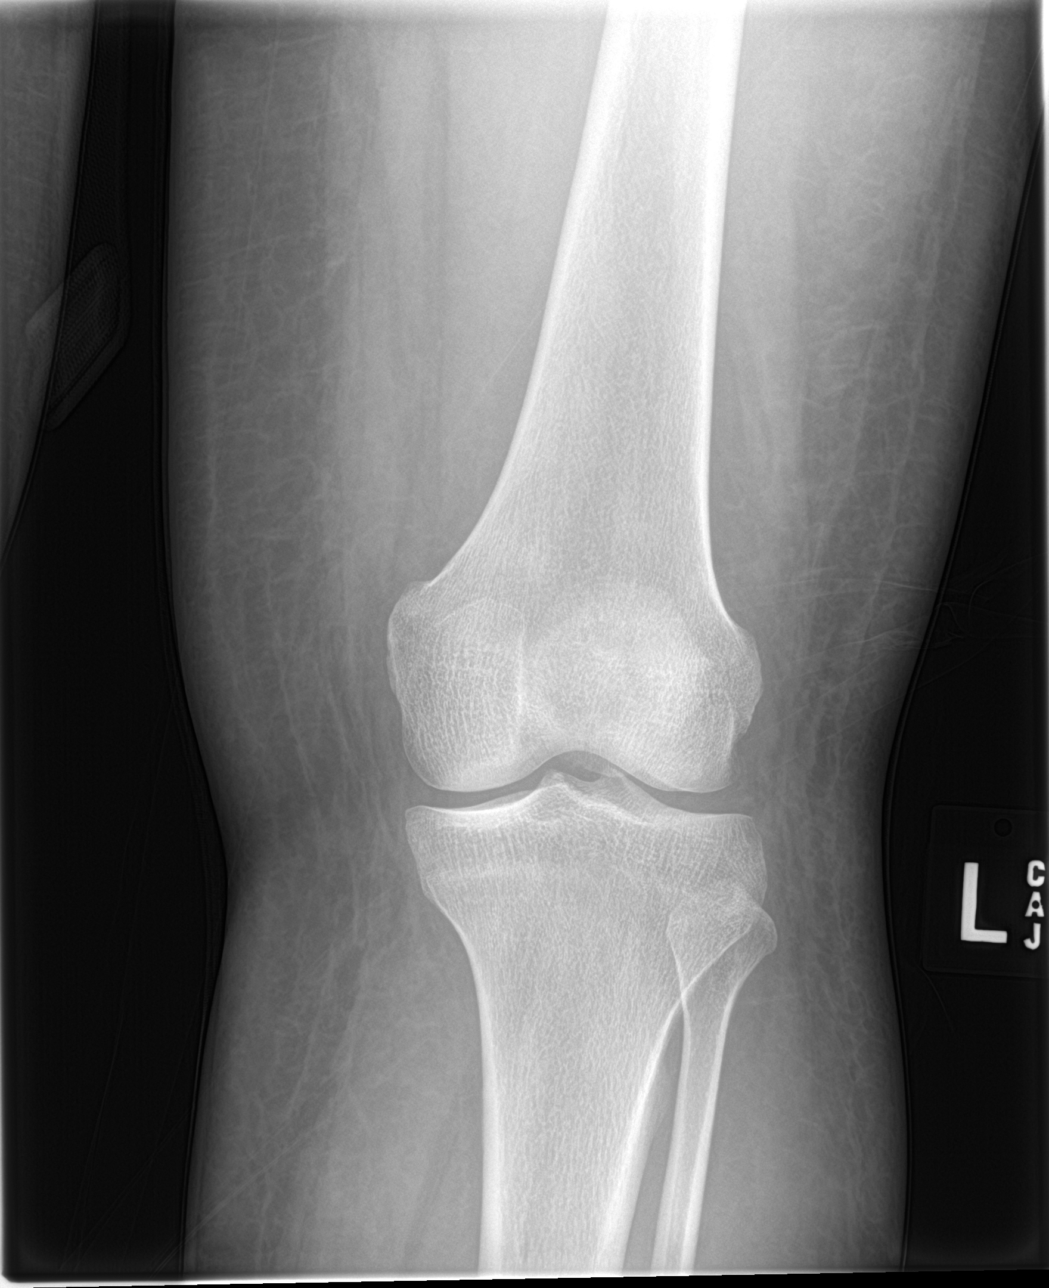

[knee lat]
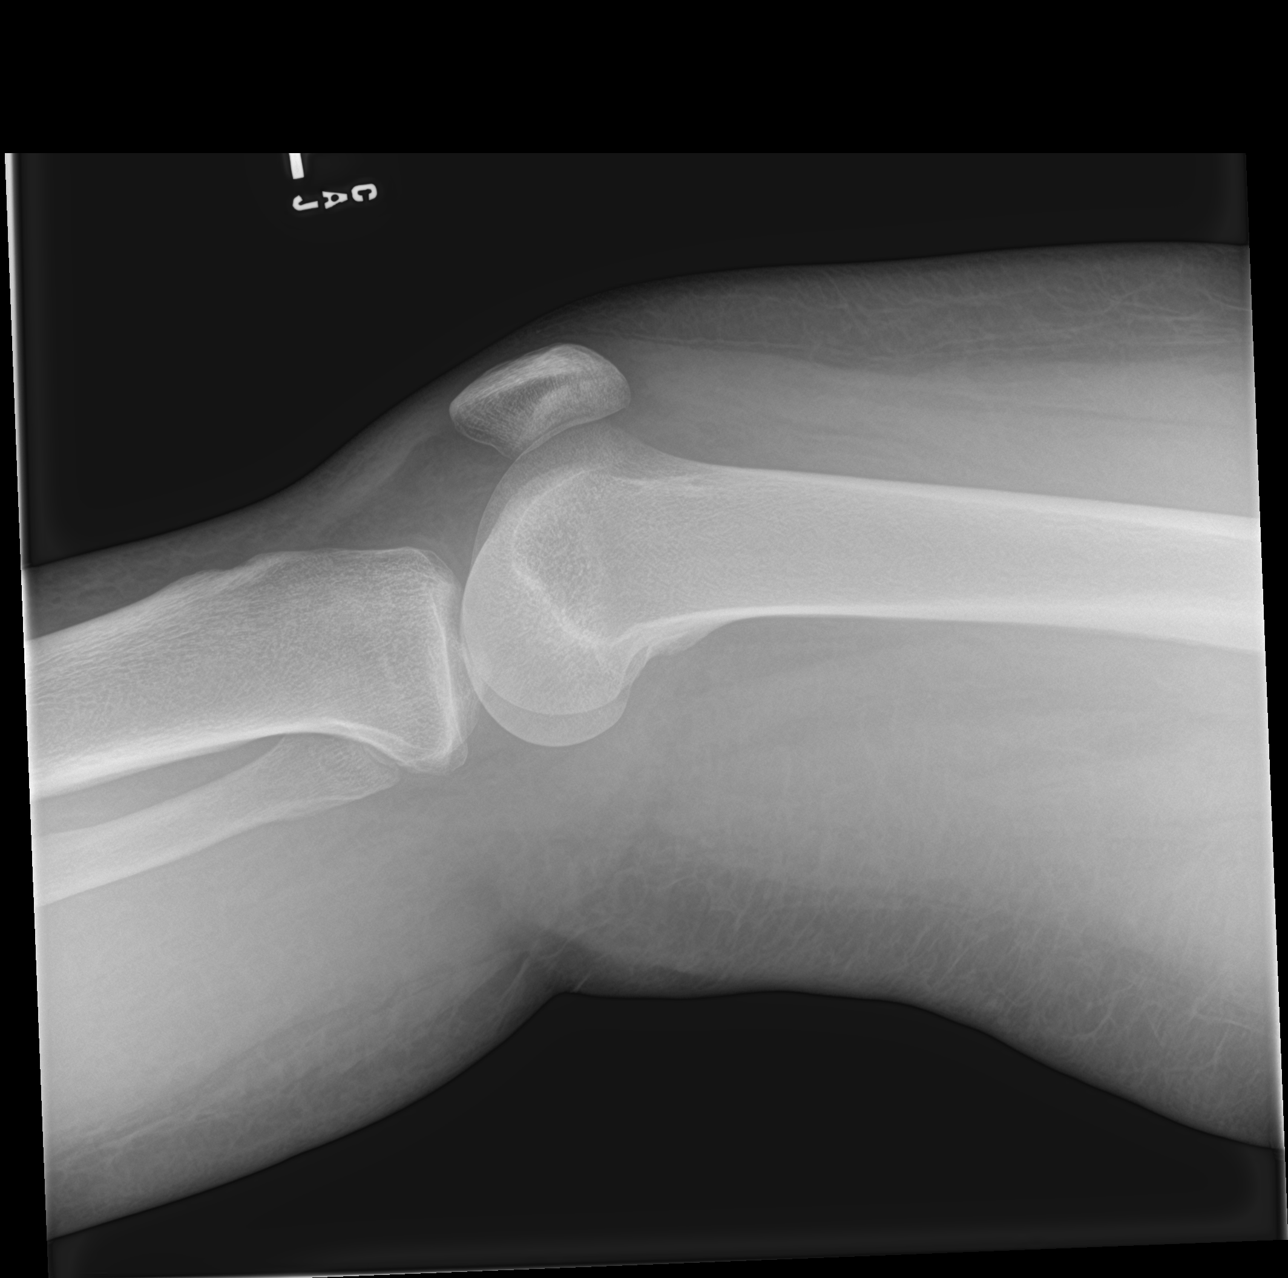

[knee obl (1 of 2)]
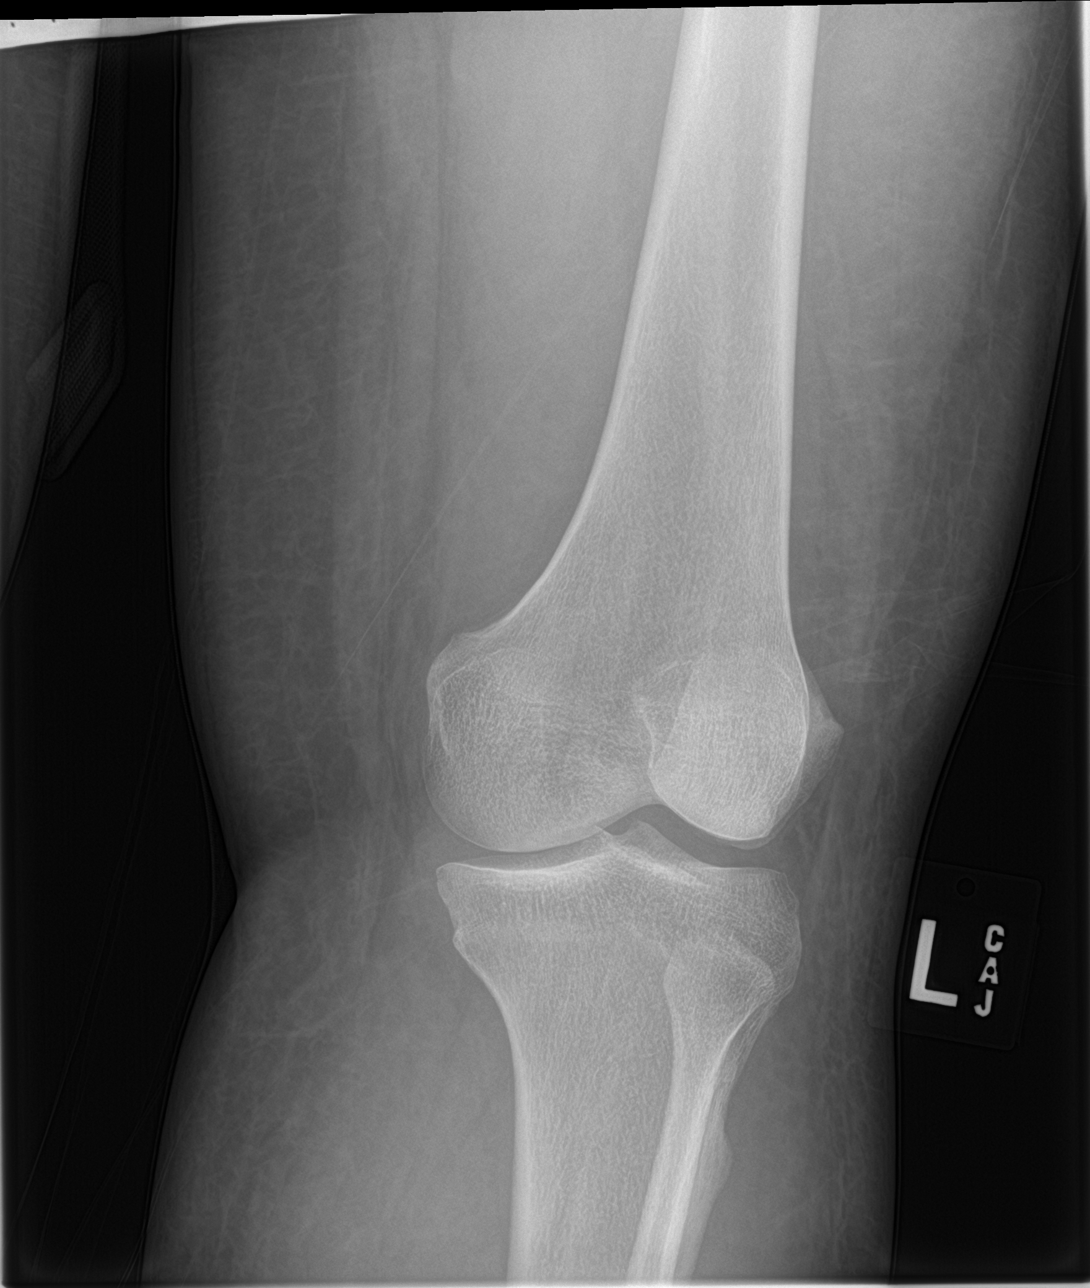

[knee obl (2 of 2)]
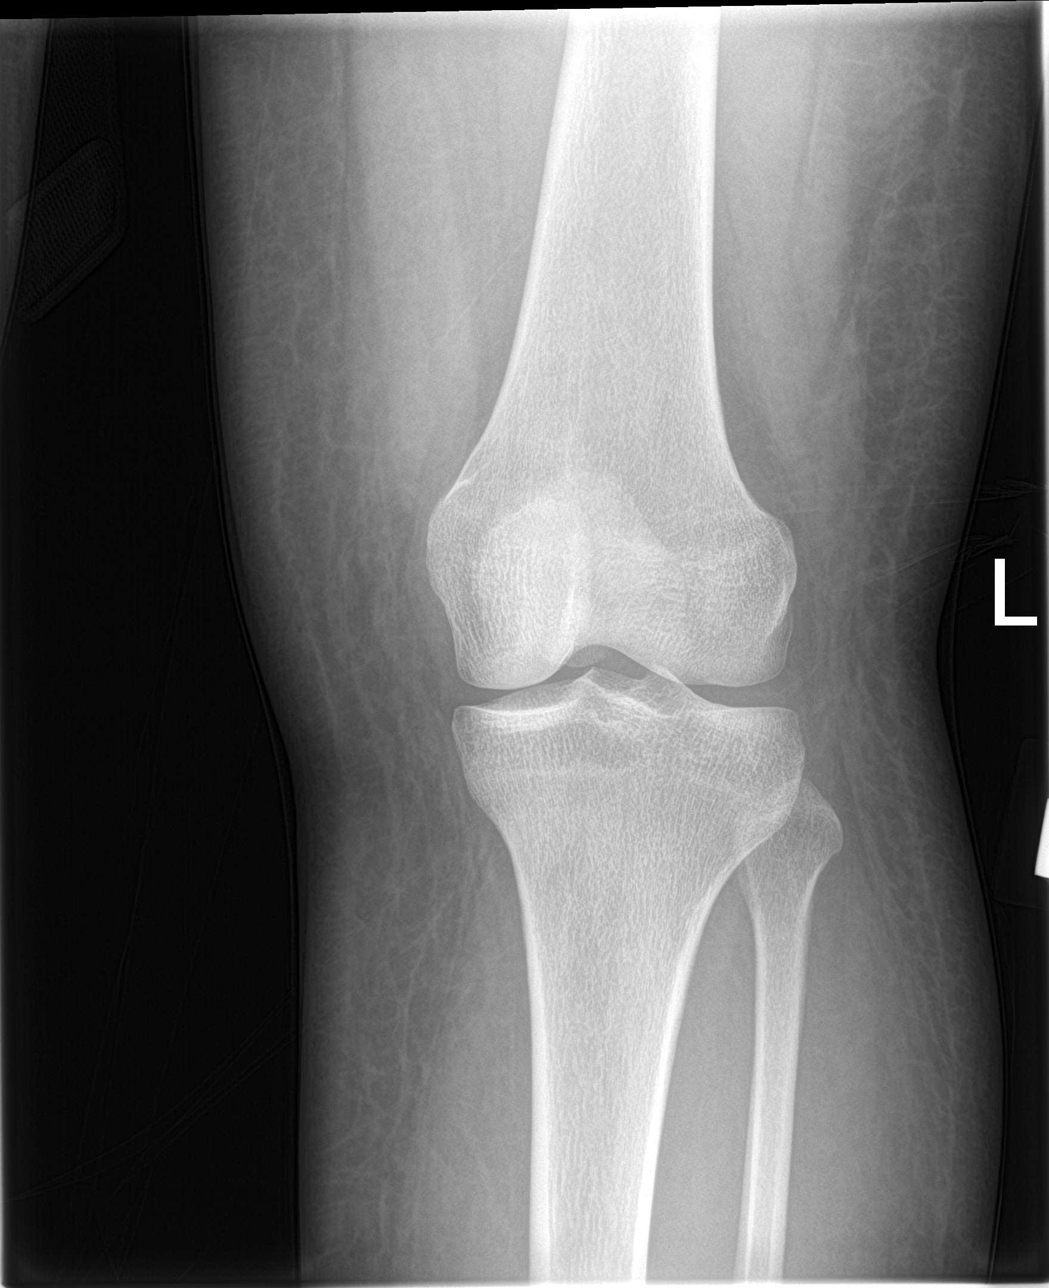

[4 of 4 positions shown; findings below may reference images not displayed]

FINDINGS: No evidence of fracture, dislocation, or joint effusion. No evidence
of arthropathy or other focal bone abnormality. Soft tissues are
unremarkable.
IMPRESSION: Negative left knee radiographs.

## 2021-04-14 ENCOUNTER — Emergency Department (HOSPITAL_COMMUNITY)
Admission: EM | Admit: 2021-04-14 | Discharge: 2021-04-16 | Disposition: A | Discharge status: Other Acute Inpatient Hospital | Payer: Medicaid Other | Attending: Emergency Medicine | Admitting: Emergency Medicine

## 2021-04-14 ENCOUNTER — Encounter (HOSPITAL_COMMUNITY): Payer: Self-pay | Admitting: Emergency Medicine

## 2021-04-14 DIAGNOSIS — R259 Unspecified abnormal involuntary movements: Secondary | ICD-10-CM | POA: Diagnosis not present

## 2021-04-14 DIAGNOSIS — Z79899 Other long term (current) drug therapy: Secondary | ICD-10-CM | POA: Diagnosis not present

## 2021-04-14 DIAGNOSIS — Z046 Encounter for general psychiatric examination, requested by authority: Secondary | ICD-10-CM

## 2021-04-14 DIAGNOSIS — Y900 Blood alcohol level of less than 20 mg/100 ml: Secondary | ICD-10-CM | POA: Diagnosis not present

## 2021-04-14 DIAGNOSIS — Z20822 Contact with and (suspected) exposure to covid-19: Secondary | ICD-10-CM | POA: Diagnosis not present

## 2021-04-14 DIAGNOSIS — F23 Brief psychotic disorder: Secondary | ICD-10-CM | POA: Diagnosis present

## 2021-04-14 DIAGNOSIS — E876 Hypokalemia: Secondary | ICD-10-CM | POA: Insufficient documentation

## 2021-04-14 DIAGNOSIS — Z87891 Personal history of nicotine dependence: Secondary | ICD-10-CM | POA: Insufficient documentation

## 2021-04-14 DIAGNOSIS — I10 Essential (primary) hypertension: Secondary | ICD-10-CM | POA: Insufficient documentation

## 2021-04-14 DIAGNOSIS — F4329 Adjustment disorder with other symptoms: Secondary | ICD-10-CM | POA: Diagnosis present

## 2021-04-14 DIAGNOSIS — F22 Delusional disorders: Secondary | ICD-10-CM

## 2021-04-14 LAB — COMPREHENSIVE METABOLIC PANEL
ALT: 11 U/L (ref 0–44)
AST: 17 U/L (ref 15–41)
Albumin: 4.5 g/dL (ref 3.5–5.0)
Alkaline Phosphatase: 74 U/L (ref 38–126)
Anion gap: 14 (ref 5–15)
BUN: 13 mg/dL (ref 6–20)
CO2: 19 mmol/L — ABNORMAL LOW (ref 22–32)
Calcium: 9.7 mg/dL (ref 8.9–10.3)
Chloride: 108 mmol/L (ref 98–111)
Creatinine, Ser: 0.97 mg/dL (ref 0.44–1.00)
GFR, Estimated: >60 mL/min (ref >60)
Glucose, Bld: 140 mg/dL — ABNORMAL HIGH (ref 70–99)
Potassium: 2.7 mmol/L — CL (ref 3.5–5.1)
Sodium: 141 mmol/L (ref 135–145)
Total Bilirubin: 1.4 mg/dL — ABNORMAL HIGH (ref 0.3–1.2)
Total Protein: 7.9 g/dL (ref 6.5–8.1)

## 2021-04-14 LAB — CBC WITH DIFFERENTIAL/PLATELET
Abs Immature Granulocytes: 0.01 10*3/uL (ref 0.00–0.07)
Basophils Absolute: 0 10*3/uL (ref 0.0–0.1)
Basophils Relative: 1 %
Eosinophils Absolute: 0 10*3/uL (ref 0.0–0.5)
Eosinophils Relative: 0 %
HCT: 42.4 % (ref 36.0–46.0)
Hemoglobin: 13.4 g/dL (ref 12.0–15.0)
Immature Granulocytes: 0 %
Lymphocytes Relative: 33 %
Lymphs Abs: 2.2 10*3/uL (ref 0.7–4.0)
MCH: 27.3 pg (ref 26.0–34.0)
MCHC: 31.6 g/dL (ref 30.0–36.0)
MCV: 86.4 fL (ref 80.0–100.0)
Monocytes Absolute: 0.4 10*3/uL (ref 0.1–1.0)
Monocytes Relative: 6 %
Neutro Abs: 3.9 10*3/uL (ref 1.7–7.7)
Neutrophils Relative %: 60 %
Platelets: 267 10*3/uL (ref 150–400)
RBC: 4.91 MIL/uL (ref 3.87–5.11)
RDW: 15.4 % (ref 11.5–15.5)
WBC: 6.5 10*3/uL (ref 4.0–10.5)
nRBC: 0 % (ref 0.0–0.2)

## 2021-04-14 LAB — RAPID URINE DRUG SCREEN, HOSP PERFORMED
Amphetamines: NOT DETECTED
Barbiturates: NOT DETECTED
Benzodiazepines: NOT DETECTED
Cocaine: NOT DETECTED
Opiates: NOT DETECTED
Tetrahydrocannabinol: NOT DETECTED

## 2021-04-14 LAB — ACETAMINOPHEN LEVEL: Acetaminophen (Tylenol), Serum: <10 ug/mL — ABNORMAL LOW (ref 10–30)

## 2021-04-14 LAB — ETHANOL: Alcohol, Ethyl (B): <10 mg/dL (ref <10)

## 2021-04-14 LAB — MAGNESIUM: Magnesium: 1.8 mg/dL (ref 1.7–2.4)

## 2021-04-14 LAB — SALICYLATE LEVEL: Salicylate Lvl: <7 mg/dL — ABNORMAL LOW (ref 7.0–30.0)

## 2021-04-14 MED ORDER — ZIPRASIDONE MESYLATE 20 MG IM SOLR: 20.0000 mg | INTRAMUSCULAR | Status: DC | PRN

## 2021-04-14 MED ORDER — ZOLPIDEM TARTRATE 5 MG PO TABS: 5.0000 mg | ORAL_TABLET | Freq: Every evening | ORAL | Status: DC | PRN

## 2021-04-14 MED ORDER — ACETAMINOPHEN 325 MG PO TABS: 650.0000 mg | ORAL_TABLET | ORAL | Status: DC | PRN

## 2021-04-14 MED ORDER — LORAZEPAM 1 MG PO TABS: 1.0000 mg | ORAL_TABLET | ORAL | Status: DC | PRN

## 2021-04-14 MED ORDER — OLANZAPINE 5 MG PO TBDP: 10.0000 mg | ORAL_TABLET | Freq: Three times a day (TID) | ORAL | Status: DC | PRN

## 2021-04-14 MED ORDER — POTASSIUM CHLORIDE CRYS ER 20 MEQ PO TBCR
40.0000 meq | EXTENDED_RELEASE_TABLET | Freq: Once | ORAL | Status: DC
  Filled 2021-04-14: qty 2

## 2021-04-14 NOTE — ED Notes (Signed)
Attempted to swab pt for Covid and flu. Pt refused. Pt states that she doesn't have it. Pt states "So you are going to make me have this thing stuck up my nose." RN states that she needed to have it done for placement. Pt continue to refuse and

## 2021-04-14 NOTE — ED Triage Notes (Signed)
Pt here in police custody IVC'd  due to hearing voices wanting to sacrifice herself and kids to the demons , pt cooperative in triage was aggressive according to cops on scene

## 2021-04-14 NOTE — BH Assessment (Addendum)
Comprehensive Clinical Assessment (CCA) Note  04/14/2021 CAMIRA GEIDEL [id number]  Disposition: Otila Back, NP recommends in patient treatment. Patient does not pose risk for suicide, however does have AMS and may benefit from Recruitment consultant.  The patient demonstrates the following risk factors for suicide: Chronic risk factors for suicide include: psychiatric disorder of delusional disorder . Acute risk factors for suicide include: family or marital conflict. Protective factors for this patient include: responsibility to others (children, family), hope for the future, and religious beliefs against suicide. Considering these factors, the overall suicide risk at this point appears to be low. Patient is not appropriate for outpatient follow up.   Flowsheet Row ED from 04/14/2021 in [facility] EMERGENCY DEPARTMENT  C-SSRS RISK CATEGORY No Risk       Patient is a 35 year old female presenting to [facility] ED under IVC. Per IVC, ""respondent is talking to her hallucination and hearing voices that have caused her to become hostile and aggressive towards anyone around her. She has stated that she wants to sacrifice herself.respondent also has stated that she sees demons in the people around her and she needs to sacrifice those people to include her children".  Upon this counselor's exam patient is initially resistant and declines to participate, however with redirection is able to complete assessment for the most part. She is an unreliable historian due to AMS and presents with grandiose delusions. When asked her name she states, "God. The name they told me was [place], but that is a lie." She tells me she lives alone, does not have any family, including children, and does not work. She states her job is for people to feel her "wrath" and to "clean the earth of people who do not worship me." She denies SI/HI/AVH. She does admit to being diagnosed with mental illness in the past but states, "that  is all a lie." Patient explains that she sleeps here and there but does not feel a need to sleep as she feels "great" and "energized." She denies any substance use or trauma history. She denies having any criminal charges. Per chart review, patient has a documented history of anxiety and a suicide attempt by overdose in 2018.  Chief Complaint: IVC  Visit Diagnosis:  F31.2 Bipolar I, current episode manic, severe, with psychosis   CCA Screening, Triage and Referral (STR)  Patient Reported Information How did you hear about [place]? Legal System  What Is the Reason for Your Visit/Call Today? IVC  How Long Has This Been Causing You Problems? <Week  What Do You Feel Would Help You the Most Today? -- ("nothing")   Have You Recently Had Any Thoughts About Hurting Yourself? No  Are You Planning to Commit Suicide/Harm Yourself At This time? No   Have you Recently Had Thoughts About Hurting Someone Karolee Ohs? No  Are You Planning to Harm Someone at This Time? No  Explanation: No data recorded  Have You Used Any Alcohol or Drugs in the Past 24 Hours? No  How Long Ago Did You Use Drugs or Alcohol? No data recorded What Did You Use and How Much? No data recorded  Do You Currently Have a Therapist/Psychiatrist? No  Name of Therapist/Psychiatrist: No data recorded  Have You Been Recently Discharged From Any Office Practice or Programs? No  Explanation of Discharge From Practice/Program: No data recorded    CCA Screening Triage Referral Assessment Type of Contact: Tele-Assessment  Telemedicine Service Delivery: Telemedicine service delivery: This service was provided via  telemedicine using a 2-way, interactive audio and video technology  Is this Initial or Reassessment? Initial Assessment  Date Telepsych consult ordered in [facility]:  04/14/21  Time Telepsych consult ordered in [facility]:  2123  Location of Assessment: [facility] ED  Provider Location: [facility] Assessment Services   Collateral  Involvement: none   Does Patient Have a Automotive engineer Guardian? No data recorded Name and Contact of Legal Guardian: No data recorded If Minor and Not Living with Parent(s), Who has Custody? No data recorded Is CPS involved or ever been involved? Never  Is APS involved or ever been involved? Never   Patient Determined To Be At Risk for Harm To Self or Others Based on Review of Patient Reported Information or Presenting Complaint? Yes, for Harm to Others  Method: No Plan  Availability of Means: No access or NA  Intent: Vague intent or NA  Notification Required: No need or identified person  Additional Information for Danger to Others Potential: Active psychosis  Additional Comments for Danger to Others Potential: No data recorded Are There Guns or Other Weapons in Your Home? No  Types of Guns/Weapons: No data recorded Are These Weapons Safely Secured?                            No data recorded Who Could Verify You Are Able To Have These Secured: No data recorded Do You Have any Outstanding Charges, Pending Court Dates, Parole/Probation? no  Contacted To Inform of Risk of Harm To Self or Others: No data recorded   Does Patient Present under Involuntary Commitment? Yes  IVC Papers Initial File Date: 04/14/21   [place] of Residence: [place]   Patient Currently Receiving the Following Services: Not Receiving Services   Determination of Need: Emergent (2 hours)   Options For Referral: Inpatient Hospitalization     CCA Biopsychosocial Patient Reported Schizophrenia/Schizoaffective Diagnosis in Past: No   Strengths: intelligent   Mental Health Symptoms Depression:   Change in energy/activity; Difficulty Concentrating; Sleep (too much or little)   Duration of Depressive symptoms:  Duration of Depressive Symptoms: Greater than two weeks   Mania:   Change in energy/activity; Euphoria; Increased Energy; Irritability; Overconfidence; Racing thoughts;  Recklessness   Anxiety:    None   Psychosis:   Delusions   Duration of Psychotic symptoms:  Duration of Psychotic Symptoms: Less than six months   Trauma:   None   Obsessions:   None   Compulsions:   None   Inattention:   N/A   Hyperactivity/Impulsivity:   N/A   Oppositional/Defiant Behaviors:   N/A   Emotional Irregularity:   N/A   Other Mood/Personality Symptoms:  No data recorded   Mental Status Exam Appearance and self-care  Stature:   Average   Weight:   Average weight   Clothing:   Neat/clean   Grooming:   Normal   Cosmetic use:   None   Posture/gait:   Tense   Motor activity:   Not Remarkable   Sensorium  Attention:   Persistent   Concentration:   Preoccupied   Orientation:   Person; Place; Object; Situation; Time   Recall/memory:   Normal   Affect and Mood  Affect:   Blunted; Labile   Mood:   Hypomania   Relating  Eye contact:   Normal   Facial expression:   Responsive; Tense   Attitude toward examiner:   Cooperative; Guarded   Thought and Language  Speech flow:  Clear and Coherent   Thought content:   Delusions   Preoccupation:   Religion   Hallucinations:   None   Organization:  No data recorded  Affiliated Computer ServicesExecutive Functions  Fund of Knowledge:   Good   Intelligence:   Average   Abstraction:   Overly abstract   Judgement:   Impaired   Reality Testing:   Distorted   Insight:   Poor   Decision Making:   Impulsive   Social Functioning  Social Maturity:   Impulsive   Social Judgement:   Heedless   Stress  Stressors:   Family conflict   Coping Ability:   Deficient supports   Skill Deficits:   None   Supports:   Support needed     Religion: Religion/Spirituality Are You A Religious Person?: Yes What is Your Religious Affiliation?: Christian How Might This Affect Treatment?: patient is hyper-religious and grandiose  Leisure/Recreation: Leisure / Recreation Do You Have  Hobbies?:  (UTA)  Exercise/Diet: Exercise/Diet Do You Exercise?:  (UTA) Have You Gained or Lost A Significant Amount of Weight in the Past Six Months?: No Do You Follow a Special Diet?: No Do You Have Any Trouble Sleeping?: Yes Explanation of Sleeping Difficulties: states she sleeps "here and there" but does not need sleep, feels energized   CCA Employment/Education Employment/Work Situation: Employment / Work Situation Employment Situation: Unemployed Patient's Job has Been Impacted by Current Illness: No Has Patient ever Been in Equities traderthe Military?: No  Education: Education Is Patient Currently Attending School?: No Last Grade Completed: 12 Did You Product managerAttend College?:  (UTA) Did You Have An Individualized Education Program (IIEP): No Did You Have Any Difficulty At School?: No Patient's Education Has Been Impacted by Current Illness: No   CCA Family/Childhood History Family and Relationship History: Family history Marital status: Single Does patient have children?: No (she denies, but IVC states she does)  Childhood History:  Childhood History By whom was/is the patient raised?:  (UTA) Did patient suffer any verbal/emotional/physical/sexual abuse as a child?: No Did patient suffer from severe childhood neglect?: No Has patient ever been sexually abused/assaulted/raped as an adolescent or adult?: No Was the patient ever a victim of a crime or a disaster?: No Witnessed domestic violence?: No Has patient been affected by domestic violence as an adult?: No  Child/Adolescent Assessment:     CCA Substance Use Alcohol/Drug Use: Alcohol / Drug Use Pain Medications: see MAR Prescriptions: see MAR Over the Counter: see MAR History of alcohol / drug use?: No history of alcohol / drug abuse                         ASAM's:  Six Dimensions of Multidimensional Assessment  Dimension 1:  Acute Intoxication and/or Withdrawal Potential:      Dimension 2:  Biomedical  Conditions and Complications:      Dimension 3:  Emotional, Behavioral, or Cognitive Conditions and Complications:     Dimension 4:  Readiness to Change:     Dimension 5:  Relapse, Continued use, or Continued Problem Potential:     Dimension 6:  Recovery/Living Environment:     ASAM Severity Score:    ASAM Recommended Level of Treatment:     Substance use Disorder (SUD)    Recommendations for Services/Supports/Treatments:    Discharge Disposition:    DSM5 Diagnoses: Patient Active Problem List   Diagnosis Date Noted   Nexplanon in place 04/17/2019   Preeclampsia 04/16/2019   Labor  without complication 04/16/2019   No prenatal care in current pregnancy 04/16/2019   SVD (spontaneous vaginal delivery) 04/16/2019   Adjustment disorder with emotional disturbance 04/26/2017   HTN (hypertension) 04/24/2017   Intentional drug overdose (HCC)    Normal labor 01/14/2017   Menorrhagia 07/31/2012   THYROMEGALY 12/17/2009   ALLERGIC RHINITIS 12/17/2009   TOBACCO USER 05/24/2009   SEIZURE DISORDER 12/20/2008     Referrals to Alternative Service(s): Referred to Alternative Service(s):   Place:   Date:   Time:    Referred to Alternative Service(s):   Place:   Date:   Time:    Referred to Alternative Service(s):   Place:   Date:   Time:    Referred to Alternative Service(s):   Place:   Date:   Time:     Celedonio Miyamoto, LCSW

## 2021-04-14 NOTE — ED Notes (Addendum)
Pt wanded by security in triage. Pt changed out into purple scrubs. Items inventoried and placed in locker #11 .

## 2021-04-14 NOTE — ED Provider Notes (Signed)
[facility] EMERGENCY DEPARTMENT Provider Note   CSN: [id number] Arrival date & time: 04/14/21  1817     History No chief complaint on file.   Kristi Marshall is a 35 y.o. female.  Presents to ER under IVC.  Her children's father took out paperwork stating, "respondent is talking to her hallucination and hearing voices that have caused her to become hostile and aggressive towards anyone around her. She has stated that she wants to sacrifice herself.respondent also has stated that she sees demons in the people around her and she needs to sacrifice those people to include her children".  Patient denies ongoing thoughts of hurting herself or hurting others.  She denies any medical complaints.  HPI     Past Medical History:  Diagnosis Date   Acetaminophen toxicity    Alcohol use 04/24/2017   Anxiety    Bronchitis    Headache    migraines   PIH (pregnancy induced hypertension)    Pregnancy induced hypertension    Pyelonephritis    Seasonal allergies    Seizure (HCC)    pseudoseizures; stress related    Patient Active Problem List   Diagnosis Date Noted   Nexplanon in place 04/17/2019   Preeclampsia 04/16/2019   Labor without complication 04/16/2019   No prenatal care in current pregnancy 04/16/2019   SVD (spontaneous vaginal delivery) 04/16/2019   Adjustment disorder with emotional disturbance 04/26/2017   HTN (hypertension) 04/24/2017   Intentional drug overdose (HCC)    Normal labor 01/14/2017   Menorrhagia 07/31/2012   THYROMEGALY 12/17/2009   ALLERGIC RHINITIS 12/17/2009   TOBACCO USER 05/24/2009   SEIZURE DISORDER 12/20/2008    Past Surgical History:  Procedure Laterality Date   abortions     DILATION AND CURETTAGE OF UTERUS     ? retained POC      OB History     Gravida  10   Para  7   Term  6   Preterm  1   AB  3   Living  7      SAB  0   IAB  3   Ectopic  0   Multiple  0   Live Births  7           Family  History  Problem Relation Age of Onset   Hypertension Mother    Diabetes Maternal Aunt    Hypertension Maternal Grandmother    Diabetes Maternal Grandmother    Heart disease Maternal Grandmother    Hypertension Maternal Grandfather    Heart disease Maternal Grandfather    Anesthesia problems Neg Hx    Hypotension Neg Hx    Malignant hyperthermia Neg Hx    Pseudochol deficiency Neg Hx    Other Neg Hx    Hearing loss Neg Hx     Social History   Tobacco Use   Smoking status: Former    Years: 7.00    Types: Cigarettes    Quit date: 05/24/2016    Years since quitting: 4.8   Smokeless tobacco: Never   Tobacco comments:    Second hand smoke  Vaping Use   Vaping Use: Never used  Substance Use Topics   Alcohol use: Yes   Drug use: No    Comment: Took Oycodone for years    Home Medications Prior to Admission medications   Medication Sig Start Date End Date Taking? Authorizing Provider  acetaminophen (TYLENOL) 500 MG tablet Take 1 tablet (500 mg total) by  mouth every 4 (four) hours as needed (for pain scale < 4). 04/17/19    Bing, MD  docusate sodium (COLACE) 250 MG capsule Take 1 capsule (250 mg total) by mouth daily. 04/09/19   Rolm Bookbinder, CNM  ferrous sulfate 325 (65 FE) MG tablet Take 1 tablet (325 mg total) by mouth daily. 04/09/19   Rolm Bookbinder, CNM  metoCLOPramide (REGLAN) 10 MG tablet Take 1 tablet (10 mg total) by mouth every 6 (six) hours as needed for nausea (nausea/headache). Patient not taking: Reported on 04/09/2019 09/17/18   Melene Plan, DO  Prenatal Vit-Fe Fumarate-FA (MULTIVITAMIN-PRENATAL) 27-0.8 MG TABS tablet Take 1 tablet by mouth daily at 12 noon. 04/09/19   Rolm Bookbinder, CNM  SUBOXONE 8-2 MG FILM Place 1 Film under the tongue daily.  04/02/19   [provider]    Allergies    Bactrim [sulfamethoxazole-trimethoprim], Ibuprofen, and Toradol [ketorolac tromethamine]  Review of Systems   Review of Systems  Constitutional:   Negative for chills and fever.  HENT:  Negative for ear pain and sore throat.   Eyes:  Negative for pain and visual disturbance.  Respiratory:  Negative for cough and shortness of breath.   Cardiovascular:  Negative for chest pain and palpitations.  Gastrointestinal:  Negative for abdominal pain and vomiting.  Genitourinary:  Negative for dysuria and hematuria.  Musculoskeletal:  Negative for arthralgias and back pain.  Skin:  Negative for color change and rash.  Neurological:  Negative for seizures and syncope.  Psychiatric/Behavioral:  Positive for behavioral problems. Negative for agitation.   All other systems reviewed and are negative.  Physical Exam Updated Vital Signs BP 128/87 (BP Location: Right Arm)   Pulse 88   Temp 98.8 F (37.1 C) (Oral)   Resp 18   SpO2 100%   Physical Exam Vitals and nursing note reviewed.  Constitutional:      General: She is not in acute distress.    Appearance: She is well-developed.  HENT:     Head: Normocephalic and atraumatic.  Eyes:     Conjunctiva/sclera: Conjunctivae normal.  Cardiovascular:     Rate and Rhythm: Normal rate.     Pulses: Normal pulses.  Pulmonary:     Effort: Pulmonary effort is normal. No respiratory distress.  Abdominal:     Palpations: Abdomen is soft.     Tenderness: There is no abdominal tenderness.  Musculoskeletal:     Cervical back: Neck supple.  Skin:    General: Skin is warm and dry.  Neurological:     General: No focal deficit present.     Mental Status: She is alert.  Psychiatric:     Comments: Calm, cooperative    ED Results / Procedures / Treatments   Labs (all labs ordered are listed, but only abnormal results are displayed) Labs Reviewed  ACETAMINOPHEN LEVEL - Abnormal; Notable for the following components:      Result Value   Acetaminophen (Tylenol), Serum <10 (*)    All other components within normal limits  COMPREHENSIVE METABOLIC PANEL - Abnormal; Notable for the following  components:   Potassium 2.7 (*)    CO2 19 (*)    Glucose, Bld 140 (*)    Total Bilirubin 1.4 (*)    All other components within normal limits  SALICYLATE LEVEL - Abnormal; Notable for the following components:   Salicylate Lvl <7.0 (*)    All other components within normal limits  ETHANOL  RAPID URINE DRUG SCREEN, HOSP PERFORMED  CBC WITH DIFFERENTIAL/PLATELET  MAGNESIUM    EKG None  Radiology No results found.  Procedures Procedures   Medications Ordered in ED Medications  potassium chloride SA (KLOR-CON) CR tablet 40 mEq (40 mEq Oral Patient Refused/Not Given 04/14/21 2237)    ED Course  I have reviewed the triage vital signs and the nursing notes.  Pertinent labs & imaging results that were available during my care of the patient were reviewed by me and considered in my medical decision making (see chart for details).    MDM Rules/Calculators/A&P                           35 year old lady presents to ER under IVC.  On exam patient is calm.  No acute distress.  Screening lab work noted for mild hypokalemia.  Provided some oral replacement.  Patient medically cleared for psych eval.  Completed first exam at work.  Will place psych hold orders while awaiting disposition from psychiatry.  Final Clinical Impression(s) / ED Diagnoses Final diagnoses:  Hypokalemia  Involuntary commitment    Rx / DC Orders ED Discharge Orders     None        Milagros Loll, MD 04/14/21 2316

## 2021-04-14 NOTE — BH Assessment (Signed)
Disposition: Otila Back, NP recommends in patient treatment. Disposition Counselor @2233 , notified [facility] AC , RN) of patient's bed needs. Patient under review at [facility].

## 2021-04-14 NOTE — ED Notes (Addendum)
Patient refused covid test states "I can't get ya'll diseases" Pt was given a mask and advised that she can not come out of room without mask and to place mask on in staff presence; [employer] remain at bedside; pt states she does not like the smell coming out of vents, and believes we are causing the smell coming into room; when RN attempted to address the issue patients states "I don't want to talk to you!" As RN walked away patient gave RN hostile stare down. -Monique,RN

## 2021-04-14 NOTE — ED Provider Notes (Signed)
Emergency Medicine Provider Triage Evaluation Note  Kristi Marshall , a 35 y.o. female  was evaluated in triage.  Pt here for medical eval. Here under ivc. Her children's father took out paperwork stating, "respondent is talking to her hallucination and hearing voices that have caused her to become hostile and aggressive towards anyone around her. She has stated that she wants to sacrifice herself.respondent also has stated that she sees demons in the people around her and she needs to sacrifice those people to include her children".  Pt denies si, hi or avh, denies drug or etoh use  Review of Systems  Positive: Hallucinations, aggressive behavior (per ivc) Negative: Si, hi avh (per pt)  Physical Exam  There were no vitals taken for this visit. Gen:   Awake, no distress   Resp:  Normal effort  MSK:   Moves extremities without difficulty  Other:  Cooperative, flat affect, denies si, hi or avh  Medical Decision Making  Medically screening exam initiated at 6:32 PM.  Appropriate orders placed.  Kristi Marshall was informed that the remainder of the evaluation will be completed by another provider, this initial triage assessment does not replace that evaluation, and the importance of remaining in the ED until their evaluation is complete.     Kristi Marshall 04/14/21 1836    Gwyneth Sprout, MD 04/14/21 2140

## 2021-04-14 NOTE — ED Notes (Signed)
Attempted to conduct routine exam on patient and administer K-dur but patient refuses to allow this RN to examine her or provider further information about what brought her here. She refuses K-dur. Discussed importance and patient continues to refuse.

## 2021-04-14 NOTE — BH Assessment (Addendum)
Disposition:   Otila Back, NP recommends in patient treatment. Disposition Counselor @2233 , notified [facility] AC , RN) of patient's bed needs.@2200 , Per AC Selena Batten, RN), no [facility] beds available. Patient faxed to hospitals for consideration of bed placement     [facility]  Pending - Request Sent N/A [address] [facility] [address] Rolene Arbour [phone] --  [facility]  Pending - Request Sent N/A [address] [place], [place] [phone] --  [facility]  Pending - Request Sent N/A [phone] [address] [place] [phone] --  [facility]  Pending - Request Sent N/A [address] [place], [address] [place] [phone] --  [facility]  Pending - Request Sent N/A [address], [facility] [address] Rande Lawman [phone] --  [facility]  Pending - Request Sent N/A [address] [place] [address] [place] [phone] --  [facility]  Pending - Request Sent N/A [address], [place] [phone] [address] [place] [phone] --  [facility]  Pending - Request Sent N/A [address] [place] [address] [place] [phone] --  [facility]  Pending - Request Sent N/A [address], [place] [address] [place] [phone] --  [facility]  Pending - Request Sent N/A [address] [place] [address] [place] [phone] --  [facility]  Pending - Request Sent N/A [address] [facility] [place] Marylou Flesher [phone] --  [facility]  Pending - Request Sent N/A [address] [facility] [place] Karolee Oh[place] [phone] --  [facility]  Pending - Request Sent N/A [address], [place] [address] [place] [phone] --  [facility]  Pending - Request Sent N/A [address], [place] [address] [place] [phone] --  [facility]  Pending - Request Sent N/A [address] [place] [address] [place] [address] [place] [phone] --  [facility]  Pending - Request Sent N/A [address] [place], [place] Evionnaz [place] [phone] --  [facility]  Pending - Request Sent N/A [address] [place] [address] [place] [phone] --  [facility]  Pending - Request Sent N/A [address] [place] [address] [place] [phone]

## 2021-04-15 ENCOUNTER — Encounter (HOSPITAL_COMMUNITY): Payer: Self-pay | Admitting: Registered Nurse

## 2021-04-15 DIAGNOSIS — F23 Brief psychotic disorder: Secondary | ICD-10-CM

## 2021-04-15 LAB — URINALYSIS, ROUTINE W REFLEX MICROSCOPIC
Bilirubin Urine: NEGATIVE
Glucose, UA: NEGATIVE mg/dL
Ketones, ur: 80 mg/dL — AB
Nitrite: NEGATIVE
Protein, ur: 30 mg/dL — AB
Specific Gravity, Urine: 1.029 (ref 1.005–1.030)
pH: 6 (ref 5.0–8.0)

## 2021-04-15 LAB — PREGNANCY, URINE: Preg Test, Ur: NEGATIVE

## 2021-04-15 LAB — RESP PANEL BY RT-PCR (FLU A&B, COVID) ARPGX2
Influenza A by PCR: NEGATIVE
Influenza B by PCR: NEGATIVE
SARS Coronavirus 2 by RT PCR: NEGATIVE

## 2021-04-15 MED ORDER — ZIPRASIDONE MESYLATE 20 MG IM SOLR: 10.0000 mg | Freq: Once | INTRAMUSCULAR | Status: DC

## 2021-04-15 MED ORDER — POTASSIUM CHLORIDE CRYS ER 20 MEQ PO TBCR
20.0000 meq | EXTENDED_RELEASE_TABLET | Freq: Two times a day (BID) | ORAL | Status: DC
  Administered 2021-04-15: 20 meq via ORAL
  Filled 2021-04-15: qty 1

## 2021-04-15 NOTE — ED Notes (Signed)
Pt refused potassium medication. She said she is okay and she does not take any medicine. Educated pt why she is needing potassium but she refused. Pt also refused blood draw, ECG and CT scan. Pt said, " I am here, I'm ok". Pt did agree on TTS. Provider notified.  Pt is resting calmly but guarded. Pt did not eat breakfast. Other foods where offered but still refused.

## 2021-04-15 NOTE — Progress Notes (Signed)
Patient information has been sent to [facility] via secure chat to review for potential admission. Patient meets inpatient criteria per Otila Back, NP.   Situation ongoing, CSW will continue to monitor progress.    Signed:  Damita Dunnings, MSW, LCSW-A  04/15/2021 9:33 AM

## 2021-04-15 NOTE — Consult Note (Signed)
Telepsych Consultation   Reason for Consult: Psychosis Referring Physician:  Milagros Loll, MD Location of Patient: [facility] ED Location of Provider: Other: [facility]  Patient Identification: Kristi Marshall MRN:  [id number] Principal Diagnosis: Brief psychotic disorder (HCC) Diagnosis:  Principal Problem:   Brief psychotic disorder (HCC) Active Problems:   Adjustment disorder with emotional disturbance   Total Time spent with patient: 30 minutes  Subjective:   Kristi Marshall is a 35 y.o. female patient admitted to [facility] ED after presenting under IVC petition by the father of her children with complaints of patient talking to herself, having hallucinations and hearing voices that have caused her to become hostile and aggressive towards anyone around her.  Patient has stated that she wanted to sacrifice herself.  Patient is also stated she sees demons and the people around her and want to sacrifice those people also including her children.  HPI:  Kristi Marshall, 35 y.o., female patient seen via tele health by this provider, consulted with Dr. Nelly Rout; and chart reviewed on 04/15/21.  On evaluation Kristi Marshall reports "I really don't understand why I am here.  I was forced out of my house and that was against my rights."  Patient reports her daughter was visiting him by the police she was handcuffed and then brought to the hospital.  Patient states she has no support system and that she lives by herself.  When asked about her children patient stated that her children just moved in with the father.  Patient states she is unemployed.  When asked how she supported herself she stated "I was getting SSI.  I'm not sure if it was still getting it."  Patient denies suicidal/self-harm/homicidal ideation, auditory/visual hallucinations, and paranoia.  Patient also denies prior suicide attempt.  She reports that she has had a prior psychiatric hospitalization about a year ago for anxiety and  depression.  Reports while in the hospital they wanted to start on medications but she didn't take any of them.  "I didn't take any of them I do better without medications."  Patient asked if she has ever made a statement of wanting to sacrifice herself to anyone and she stated  "I never said anything like that to anyone."  Patient states she wants to go home that she does not need to be in the hospital.  "They cannot make me do anything.  I was brought here against my we will.  Now they want me to take medicines but I can't be forced to take any medicine or CT scan."   During evaluation Kristi Marshall is sitting up in bed in no acute distress.  She is alert, oriented x 4, calm and cooperative throughout assessment; but has been refusing to take any medications or received any testing.  Attempted to educate patient on potassium level being low and needed medication but patient became irritated and stated that she could not be forced to take any medications.  Her mood is irritable and depressed with congruent affect.  Patient does appear to have some paranoid ideation and possibly auditory hallucination.  Patient looking around room as though she is looking for something.  She is very guarded.  Although she denies suicidal/self-harm/homicidal ideation, psychosis, paranoia.     Past Psychiatric History: Patient reported she has only been diagnosed in the past with depression and anxiety.  Risk to Self: Yes Risk to Others: Yes Prior Inpatient Therapy: Yes Prior Outpatient Therapy: Yes  Past Medical History:  Past Medical History:  Diagnosis Date   Acetaminophen toxicity    Alcohol use 04/24/2017   Anxiety    Bronchitis    Headache    migraines   PIH (pregnancy induced hypertension)    Pregnancy induced hypertension    Pyelonephritis    Seasonal allergies    Seizure (HCC)    pseudoseizures; stress related    Past Surgical History:  Procedure Laterality Date   abortions     DILATION AND  CURETTAGE OF UTERUS     ? retained POC    Family History:  Family History  Problem Relation Age of Onset   Hypertension Mother    Diabetes Maternal Aunt    Hypertension Maternal Grandmother    Diabetes Maternal Grandmother    Heart disease Maternal Grandmother    Hypertension Maternal Grandfather    Heart disease Maternal Grandfather    Anesthesia problems Neg Hx    Hypotension Neg Hx    Malignant hyperthermia Neg Hx    Pseudochol deficiency Neg Hx    Other Neg Hx    Hearing loss Neg Hx    Family Psychiatric  History: Unaware Social History:  Social History   Substance and Sexual Activity  Alcohol Use Yes     Social History   Substance and Sexual Activity  Drug Use No   Comment: Comptroller for years    Social History   Socioeconomic History   Marital status: Single    Spouse name: Not on file   Number of children: Not on file   Years of education: Not on file   Highest education level: Not on file  Occupational History   Not on file  Tobacco Use   Smoking status: Former    Years: 7.00    Types: Cigarettes    Quit date: 05/24/2016    Years since quitting: 4.8   Smokeless tobacco: Never   Tobacco comments:    Second hand smoke  Vaping Use   Vaping Use: Never used  Substance and Sexual Activity   Alcohol use: Yes   Drug use: No    Comment: Took Oycodone for years   Sexual activity: Yes    Comment: last intercourse over a month ago  Other Topics Concern   Not on file  Social History Narrative   Not on file   Social Determinants of Health   Financial Resource Strain: Not on file  Food Insecurity: Not on file  Transportation Needs: Not on file  Physical Activity: Not on file  Stress: Not on file  Social Connections: Not on file   Additional Social History:    Allergies:   Allergies  Allergen Reactions   Bactrim [Sulfamethoxazole-Trimethoprim] Nausea And Vomiting and Other (See Comments)    high fevers.    Ibuprofen Itching   Toradol  [Ketorolac Tromethamine] Itching    Labs:  Results for orders placed or performed during the hospital encounter of 04/14/21 (from the past 48 hour(s))  Acetaminophen level     Status: Abnormal   Collection Time: 04/14/21  6:36 PM  Result Value Ref Range   Acetaminophen (Tylenol), Serum <10 (L) 10 - 30 ug/mL    Comment: (NOTE) Therapeutic concentrations vary significantly. A range of 10-30 ug/mL  may be an effective concentration for many patients. However, some  are best treated at concentrations outside of this range. Acetaminophen concentrations >150 ug/mL at 4 hours after ingestion  and >50 ug/mL at 12 hours after ingestion are often associated with  toxic  reactions.  Performed at [facility], [address], [place], [place] [address]   Comprehensive metabolic panel     Status: Abnormal   Collection Time: 04/14/21  6:36 PM  Result Value Ref Range   Sodium 141 135 - 145 mmol/L   Potassium 2.7 (LL) 3.5 - 5.1 mmol/L    Comment: CRITICAL RESULT CALLED TO, READ BACK BY AND VERIFIED WITH: C Vantrell,RN 2008 04/14/2021 WBOND    Chloride 108 98 - 111 mmol/L   CO2 19 (L) 22 - 32 mmol/L   Glucose, Bld 140 (H) 70 - 99 mg/dL    Comment: Glucose reference range applies only to samples taken after fasting for at least 8 hours.   BUN 13 6 - 20 mg/dL   Creatinine, Ser 1.22 0.44 - 1.00 mg/dL   Calcium 9.7 8.9 - 48.2 mg/dL   Total Protein 7.9 6.5 - 8.1 g/dL   Albumin 4.5 3.5 - 5.0 g/dL   AST 17 15 - 41 U/L   ALT 11 0 - 44 U/L   Alkaline Phosphatase 74 38 - 126 U/L   Total Bilirubin 1.4 (H) 0.3 - 1.2 mg/dL   GFR, Estimated >50 >03 mL/min    Comment: (NOTE) Calculated using the CKD-EPI Creatinine Equation (2021)    Anion gap 14 5 - 15    Comment: Performed at [facility], [address], [place], [place] [address]  Ethanol     Status: None   Collection Time: 04/14/21  6:36 PM  Result Value Ref Range   Alcohol, Ethyl (B) <10 <10 mg/dL    Comment: (NOTE) Lowest  detectable limit for serum alcohol is 10 mg/dL.  For medical purposes only. Performed at [facility], [address], [place], [place] [address]   Salicylate level     Status: Abnormal   Collection Time: 04/14/21  6:36 PM  Result Value Ref Range   Salicylate Lvl <7.0 (L) 7.0 - 30.0 mg/dL    Comment: Performed at [facility], [address] [place], [place] [address]  Urine rapid drug screen (hosp performed)     Status: None   Collection Time: 04/14/21  6:36 PM  Result Value Ref Range   Opiates NONE DETECTED NONE DETECTED   Cocaine NONE DETECTED NONE DETECTED   Benzodiazepines NONE DETECTED NONE DETECTED   Amphetamines NONE DETECTED NONE DETECTED   Tetrahydrocannabinol NONE DETECTED NONE DETECTED   Barbiturates NONE DETECTED NONE DETECTED    Comment: (NOTE) DRUG SCREEN FOR MEDICAL PURPOSES ONLY.  IF CONFIRMATION IS NEEDED FOR ANY PURPOSE, NOTIFY LAB WITHIN 5 DAYS.  LOWEST DETECTABLE LIMITS FOR URINE DRUG SCREEN Drug Class                     Cutoff (ng/mL) Amphetamine and metabolites    1000 Barbiturate and metabolites    200 Benzodiazepine                 200 Tricyclics and metabolites     300 Opiates and metabolites        300 Cocaine and metabolites        300 THC                            50 Performed at [facility], [address] [place], [place] [address]   CBC with Differential     Status: None   Collection Time: 04/14/21  6:36 PM  Result Value Ref Range   WBC 6.5 4.0 - 10.5 K/uL   RBC 4.91 3.87 - 5.11 MIL/uL   Hemoglobin 13.4 12.0 - 15.0 g/dL   HCT 16.1 09.6 - 04.5 %   MCV 86.4 80.0 - 100.0 fL   MCH 27.3 26.0 - 34.0 pg   MCHC 31.6 30.0 - 36.0 g/dL   RDW 40.9 81.1 - 91.4 %   Platelets 267 150 - 400 K/uL   nRBC 0.0 0.0 - 0.2 %   Neutrophils Relative % 60 %   Neutro Abs 3.9 1.7 - 7.7 K/uL   Lymphocytes Relative 33 %   Lymphs Abs 2.2 0.7 - 4.0 K/uL   Monocytes Relative 6 %   Monocytes Absolute 0.4 0.1 - 1.0 K/uL   Eosinophils  Relative 0 %   Eosinophils Absolute 0.0 0.0 - 0.5 K/uL   Basophils Relative 1 %   Basophils Absolute 0.0 0.0 - 0.1 K/uL   Immature Granulocytes 0 %   Abs Immature Granulocytes 0.01 0.00 - 0.07 K/uL    Comment: Performed at [facility], [address] [place], [place] [address]  Magnesium     Status: None   Collection Time: 04/14/21  6:36 PM  Result Value Ref Range   Magnesium 1.8 1.7 - 2.4 mg/dL    Comment: Performed at [facility], [address] [place], [place] [address]    Medications:  Current Facility-Administered Medications  Medication Dose Route Frequency Provider Last Rate Last Admin   acetaminophen (TYLENOL) tablet 650 mg  650 mg Oral Q4H PRN Milagros Loll, MD       OLANZapine zydis (ZYPREXA) disintegrating tablet 10 mg  10 mg Oral Q8H PRN Milagros Loll, MD       And   LORazepam (ATIVAN) tablet 1 mg  1 mg Oral PRN Milagros Loll, MD       And   ziprasidone (GEODON) injection 20 mg  20 mg Intramuscular PRN Milagros Loll, MD       potassium chloride SA (KLOR-CON) CR tablet 20 mEq  20 mEq Oral BID Cathren Laine, MD       potassium chloride SA (KLOR-CON) CR tablet 40 mEq  40 mEq Oral Once Couture, Cortni S, PA-C       ziprasidone (GEODON) injection 10 mg  10 mg Intramuscular Once Cathren Laine, MD       zolpidem (AMBIEN) tablet 5 mg  5 mg Oral QHS PRN Milagros Loll, MD       Current Outpatient Medications  Medication Sig Dispense Refill   Prenatal Vit-Fe Fumarate-FA (MULTIVITAMIN-PRENATAL) 27-0.8 MG TABS tablet Take 1 tablet by mouth daily at 12 noon. (Patient not taking: Reported on 04/15/2021) 30 tablet 0    Musculoskeletal: Strength & Muscle Tone: within normal limits Gait & Station: normal Patient leans: N/A   Psychiatric Specialty Exam:  Presentation  General Appearance: Appropriate for Environment  Eye Contact:Good  Speech:Clear and Coherent; Normal Rate  Speech Volume:Normal  Handedness:Right   Mood and Affect   Mood:Irritable; Dysphoric  Affect:Depressed; Restricted   Thought Process  Thought Processes:Coherent  Descriptions of Associations:Circumstantial  Orientation:Full (Time, Place and Person)  Thought Content:Paranoid Ideation  History of Schizophrenia/Schizoaffective disorder:No  Duration of Psychotic Symptoms:Less than six months  Hallucinations:Hallucinations: Other (comment) (Patient denies but looking around room)  Ideas of Reference:Paranoia  Suicidal Thoughts:Suicidal Thoughts: No  Homicidal Thoughts:Homicidal Thoughts: No   Sensorium  Memory:Immediate Fair; Recent Fair  Judgment:Fair  Insight:Fair   Executive Functions  Concentration:Good  Attention Span:Good  Recall:Fair  Fund of Knowledge:Good  Language:Good   Psychomotor Activity  Psychomotor Activity:Psychomotor Activity: Normal   Assets  Assets:Communication Skills; Social Support; Resilience; Housing   Sleep  Sleep: No data recorded   Physical Exam: Physical Exam Vitals and nursing note reviewed. Exam conducted with a chaperone present.  Constitutional:      General: She is not in acute distress.    Appearance: Normal appearance. She is not ill-appearing.  Cardiovascular:     Rate and Rhythm: Normal rate.  Pulmonary:     Effort: Pulmonary effort is normal.  Neurological:     Mental Status: She is alert and oriented to person, place, and time.  Psychiatric:        Attention and Perception: She perceives auditory hallucinations.        Mood and Affect: Mood is anxious and depressed.        Speech: Speech normal.        Behavior: Behavior is agitated.        Thought Content: Thought content is paranoid. Thought content does not include homicidal or suicidal ideation.        Judgment: Judgment is impulsive.   Review of Systems  Constitutional: Negative.   HENT: Negative.    Eyes: Negative.   Respiratory: Negative.    Cardiovascular: Negative.   Gastrointestinal: Negative.    Genitourinary: Negative.   Musculoskeletal: Negative.   Skin: Negative.   Neurological: Negative.   Endo/Heme/Allergies: Negative.   Psychiatric/Behavioral:  Positive for depression. Hallucinations: Denies. Substance abuse: Denies. Suicidal ideas: Denies.The patient is nervous/anxious. Insomnia: Denies.  Blood pressure 119/80, pulse 89, temperature 98.8 F (37.1 C), temperature source Oral, resp. rate 18, SpO2 100 %, unknown if currently breastfeeding. There is no height or weight on file to calculate BMI.  Treatment Plan Summary: Daily contact with patient to assess and evaluate symptoms and progress in treatment, Medication management, and Plan inpatient psychiatric treatment  Disposition: Recommend psychiatric Inpatient admission when medically cleared.  This service was provided via telemedicine using a 2-way, interactive audio and video technology.  Names of all persons participating in this telemedicine service and their role in this encounter. Name: Assunta FoundShuvon Zoha Spranger Role: NP  Name: Dr. Nelly RoutArchana Kumar Role: Psychiatrist  Name: Kristi HowellsBrittany Marshall Role: Patient  Name: Mariane DuvalMaria Lasher Role: Patient's nurse sent a secure message informing: Psychiatric consult complete.  Continue to recommend inpatient psychiatric treatment.  If no beds available [facility] patient to be faxed out to surrounding facilities for available inpatient bed.  Please inform MD only default is listed.    Iyonnah Ferrante, NP 04/15/2021 2:07 PM

## 2021-04-15 NOTE — Progress Notes (Signed)
Patient has been faxed out due to no bed availability at [facility]. Patient meets inpatient criteria per Eddie Nwoko,NP. Patient referred to the following facilities:  [facility]  [address], [place] [address] [phone]  [facility]  [address] [place], [place] [address] [phone]  [facility]  [phone] [place] [address] [phone]  [facility]  [address] [place], [place] [address] [phone]  [facility]  [address] [place] [address] [phone]  [facility]  [address], [place] [address] [phone]  [facility]  [address], [place] [address] [phone]  [facility]  [address], [place] [address] [phone]  [facility]  [address] [place] [address] [phone]  [facility]  [address] [place] [address] [phone]  [facility]  [address] [place] [address] [phone]  [facility]  [address] [place] [address] [phone]  [facility]  [address], [place] [address] [phone]  [facility]  [address] [place] [address] [phone]  [facility]  [address] [place] [address] [phone]  [facility]  [address] [place], [place] [address] [phone]  [facility]  [address], [place] [address] [phone]  [facility]  [address], [place] [address] [phone]    CSW will continue to  monitor disposition.    Damita Dunnings, MSW, LCSW-A  12:44 PM 04/15/2021

## 2021-04-15 NOTE — ED Notes (Signed)
CT tech came to take pt but pt said "I don't need it".

## 2021-04-15 NOTE — ED Notes (Signed)
Pt agreed to give UA sample, to let us obtain an EKG, and to let us get blood work from her

## 2021-04-15 NOTE — ED Notes (Signed)
Pt asked for orange juice. Pt provided with juice and asked to take meds again. Pt once again refused meds.

## 2021-04-15 NOTE — ED Notes (Signed)
Pt ambulated to restroom at this time to obtain UA sample

## 2021-04-15 NOTE — ED Notes (Signed)
Received verbal report from Maria L RN at this time 

## 2021-04-15 NOTE — Discharge Instructions (Addendum)
Transport to [employer], 8/4 at 8 AM

## 2021-04-15 NOTE — ED Provider Notes (Signed)
Emergency Medicine Observation Re-evaluation Note  Kristi Marshall is a 35 y.o. female, seen on rounds today.  Pt initially presented to the ED for complaints of No chief complaint on file. Currently, the patient is IVC for inpatient psychiatric admission.  Physical Exam  BP 119/80 (BP Location: Right Arm)   Pulse 89   Temp 98.8 F (37.1 C) (Oral)   Resp 18   SpO2 100%  Physical Exam General: Awake, sitting up in bed, no acute distress Neuro: No focal deficits. Lungs: Breathing is even and unlabored. Psych: Refusing medical testing or interventions.  Continues to have delusional thoughts.  ED Course / MDM   I have reviewed the labs performed to date as well as medications administered while in observation.  Recent changes in the last 24 hours include patient's initial potassium was 2.7.  She has refused p.o. replacement or any further blood draws.  She is also refusing to eat anything initially.  She was subsequently willing to eat some oranges.  Patient remains delusional and uncooperative but did not exhibit agitation during shift.  Plan  Current plan is for patient presents with psychosis, suicidal ideation, and homicidal ideation.  She will require inpatient admission.  LELLA MULLANY is under involuntary commitment.      Gloris Manchester, MD 04/15/21 [phone]

## 2021-04-15 NOTE — ED Provider Notes (Addendum)
Emergency Medicine Observation Re-evaluation Note  Kristi Marshall is a 35 y.o. female, seen on rounds today.  Pt initially presented to the ED for complaints of acute psychosis, auditory hallucinations, and delusional thoughts. Currently calm, nad.   Physical Exam  BP 119/80 (BP Location: Right Arm)   Pulse 89   Temp 98.8 F (37.1 C) (Oral)   Resp 18   SpO2 100%  Physical Exam General: calm. Cardiac: regular rate.  Lungs: breathing comfortably. Psych: calm, alert, ?responding to internal stimuli, delusions.   ED Course / MDM   I have reviewed the labs performed to date as well as medications administered while in observation.  Recent changes in the last 24 hours include observation and stabilization in ED, BH assessment, w prn meds for psychosis, agitation.   Plan   Kristi Marshall is under involuntary commitment.   BH team is recommending inpatient BH treatment and is seeking placement for patient.  Currently it appears patient is not on any scheduled BH meds - will ask BH team to reassess, and make BH medication recommendations.    RN indicates pt refusing po meds.  Given delusions/psychosis, will give dose geodon im while awaiting BH reassessment and scheduled med recommendations.     Kristi Laine, MD 04/15/21 [phone]  [facility] team has reassessed. Pt has been accepted to [facility] tomorrow AM at 8 AM.        Kristi Laine, MD 04/15/21 [phone]

## 2021-04-15 NOTE — Progress Notes (Signed)
Pt accepted to  [facility]    Patient meets inpatient criteria per Otila Back, NP  Dr.Thomas Loyola Mast is the attending provider.    Call report to [phone]  Dorris Fetch, RN @ [facility] ED notified.     Pt scheduled  to arrive at [facility] tomorrow after 0800.   Damita Dunnings, MSW, LCSW-A  1:07 PM 04/15/2021

## 2021-04-16 LAB — BASIC METABOLIC PANEL
Anion gap: 10 (ref 5–15)
BUN: 6 mg/dL (ref 6–20)
CO2: 25 mmol/L (ref 22–32)
Calcium: 9.6 mg/dL (ref 8.9–10.3)
Chloride: 105 mmol/L (ref 98–111)
Creatinine, Ser: 0.79 mg/dL (ref 0.44–1.00)
GFR, Estimated: >60 mL/min (ref >60)
Glucose, Bld: 109 mg/dL — ABNORMAL HIGH (ref 70–99)
Potassium: 3.3 mmol/L — ABNORMAL LOW (ref 3.5–5.1)
Sodium: 140 mmol/L (ref 135–145)

## 2021-04-16 NOTE — ED Notes (Signed)
Pt is requesting something to eat and a change of clothes. Provided pt with supplies to take a shower at this time

## 2021-04-16 NOTE — ED Notes (Signed)
Pt ambulatory exit with [employer] to [facility] Belongings given to [employer]. Report was called to [facility] this AM. Pt stable and denies complaints. Steady gait to exit with [employer].

## 2021-04-16 NOTE — ED Notes (Signed)
C/o abd cramping in lower abd. Requested a heating pad. Provided a heat pack at this time. Asked pt if she wanted me to discuss with the provider and get her something to take for it and pt advises not at this time

## 2021-04-16 NOTE — ED Provider Notes (Signed)
Patient has been accepted to [facility] for inpatient psychiatric services.  She is under IVC.  Transport has arrived.  Vitals are stable.  She did have hypokalemia originally, this was replaced and repeat potassium is 3.3.  Patient is currently calm and cooperative.  Stable at time of transport.   Rozelle Logan, DO 04/16/21 1323

## 2021-06-18 ENCOUNTER — Encounter (HOSPITAL_COMMUNITY): Payer: Self-pay | Admitting: Emergency Medicine

## 2021-06-18 ENCOUNTER — Other Ambulatory Visit: Payer: Self-pay

## 2021-06-18 ENCOUNTER — Emergency Department (HOSPITAL_COMMUNITY)
Admission: EM | Admit: 2021-06-18 | Discharge: 2021-06-18 | Disposition: A | Discharge status: Home / Self Care | Payer: Medicaid Other | Attending: Emergency Medicine | Admitting: Emergency Medicine

## 2021-06-18 DIAGNOSIS — Z87891 Personal history of nicotine dependence: Secondary | ICD-10-CM | POA: Insufficient documentation

## 2021-06-18 DIAGNOSIS — I10 Essential (primary) hypertension: Secondary | ICD-10-CM | POA: Insufficient documentation

## 2021-06-18 DIAGNOSIS — R000 Tachycardia, unspecified: Secondary | ICD-10-CM | POA: Diagnosis not present

## 2021-06-18 DIAGNOSIS — N9489 Other specified conditions associated with female genital organs and menstrual cycle: Secondary | ICD-10-CM | POA: Diagnosis not present

## 2021-06-18 DIAGNOSIS — F419 Anxiety disorder, unspecified: Secondary | ICD-10-CM | POA: Insufficient documentation

## 2021-06-18 LAB — I-STAT BETA HCG BLOOD, ED (MC, WL, AP ONLY): I-stat hCG, quantitative: <5 m[IU]/mL (ref <5)

## 2021-06-18 LAB — CBC WITH DIFFERENTIAL/PLATELET
Abs Immature Granulocytes: 0.02 10*3/uL (ref 0.00–0.07)
Basophils Absolute: 0 10*3/uL (ref 0.0–0.1)
Basophils Relative: 1 %
Eosinophils Absolute: 0 10*3/uL (ref 0.0–0.5)
Eosinophils Relative: 0 %
HCT: 40.2 % (ref 36.0–46.0)
Hemoglobin: 12.4 g/dL (ref 12.0–15.0)
Immature Granulocytes: 0 %
Lymphocytes Relative: 27 %
Lymphs Abs: 2.2 10*3/uL (ref 0.7–4.0)
MCH: 27.3 pg (ref 26.0–34.0)
MCHC: 30.8 g/dL (ref 30.0–36.0)
MCV: 88.5 fL (ref 80.0–100.0)
Monocytes Absolute: 0.3 10*3/uL (ref 0.1–1.0)
Monocytes Relative: 3 %
Neutro Abs: 5.7 10*3/uL (ref 1.7–7.7)
Neutrophils Relative %: 69 %
Platelets: 383 10*3/uL (ref 150–400)
RBC: 4.54 MIL/uL (ref 3.87–5.11)
RDW: 16.1 % — ABNORMAL HIGH (ref 11.5–15.5)
WBC: 8.3 10*3/uL (ref 4.0–10.5)
nRBC: 0 % (ref 0.0–0.2)

## 2021-06-18 LAB — COMPREHENSIVE METABOLIC PANEL
ALT: 11 U/L (ref 0–44)
AST: 13 U/L — ABNORMAL LOW (ref 15–41)
Albumin: 4.6 g/dL (ref 3.5–5.0)
Alkaline Phosphatase: 93 U/L (ref 38–126)
Anion gap: 8 (ref 5–15)
BUN: 6 mg/dL (ref 6–20)
CO2: 27 mmol/L (ref 22–32)
Calcium: 9.7 mg/dL (ref 8.9–10.3)
Chloride: 107 mmol/L (ref 98–111)
Creatinine, Ser: 0.64 mg/dL (ref 0.44–1.00)
GFR, Estimated: >60 mL/min (ref >60)
Glucose, Bld: 109 mg/dL — ABNORMAL HIGH (ref 70–99)
Potassium: 3.8 mmol/L (ref 3.5–5.1)
Sodium: 142 mmol/L (ref 135–145)
Total Bilirubin: 0.2 mg/dL — ABNORMAL LOW (ref 0.3–1.2)
Total Protein: 8.5 g/dL — ABNORMAL HIGH (ref 6.5–8.1)

## 2021-06-18 MED ORDER — HYDROXYZINE HCL 25 MG PO TABS
25.0000 mg | ORAL_TABLET | Freq: Four times a day (QID) | ORAL | 0 refills | Status: AC
Start: 2021-06-18 — End: ?

## 2021-06-18 NOTE — Discharge Instructions (Addendum)
Please follow up with your PCP regarding anxiety as well as  your high blood pressure and to restart your blood pressure medications.   Pick up the medication prescribed today and take as needed for anxiety. Please see additional information on managing anxiety.   Return to the ED for any new/worsening symptoms

## 2021-06-18 NOTE — ED Provider Notes (Signed)
[facility]-EMERGENCY DEPT Provider Note   CSN: [id number] Arrival date & time: 06/18/21  0113     History Chief Complaint  Patient presents with   Anxiety   Hypertension    Kristi Marshall is a 35 y.o. female who presents to the ED today vai EMS with complaint of high blood pressure and anxiety. Pt reports she was sitting on her couch last night when she began having heart palpitations. She also reports feeling cold and clammy. She admits to history of anxiety and feels like this was an anxiety attack. Pt called EMS for further eval. She also complains of high blood pressure - she states she was on 5 mg amlodipine however ran out about 2 months ago and never called her PCP to have it refilled. She admits that a lot of things have been going on in her life recently however she plans to follow up with them. Her BP with EMS was apparently 180/130 however 140s systolic in the ED. She denies chest pain or SOB. No SI, HI, or AVH. No other complaints.   The history is provided by the patient and medical records.      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 retained POC      OB History     Gravida  10   Para  7   Term  6   Preterm  1   AB  3   Living  7      SAB  0   IAB  3   Ectopic  0   Multiple  0   Live Births  7           Family History  Problem Relation Age of Onset   Hypertension Mother    Diabetes Maternal Aunt    Hypertension Maternal Grandmother    Diabetes Maternal Grandmother    Heart disease Maternal Grandmother    Hypertension Maternal Grandfather    Heart disease Maternal Grandfather    Anesthesia problems Neg Hx    Hypotension Neg Hx    Malignant hyperthermia Neg Hx    Pseudochol deficiency Neg Hx    Other Neg Hx    Hearing loss Neg Hx     Social History   Tobacco Use   Smoking status: Former    Years: 7.00    Types: Cigarettes    Quit date: 05/24/2016    Years since  quitting: 5.0   Smokeless tobacco: Never   Tobacco comments:    Second hand smoke  Vaping Use   Vaping Use: Never used  Substance Use Topics   Alcohol use: Yes   Drug use: No    Comment: Took Oycodone for years    Home Medications Prior to Admission medications   Medication Sig Start Date End Date Taking? Authorizing Provider  hydrOXYzine (ATARAX/VISTARIL) 25 MG tablet Take 1 tablet (25 mg total) by mouth every 6 (six) hours. 06/18/21  Yes Uel Davidow, PA-C  Prenatal Vit-Fe Fumarate-FA (MULTIVITAMIN-PRENATAL) 27-0.8 MG TABS tablet Take 1 tablet by mouth daily at 12 noon. Patient not taking: Reported on 04/15/2021 04/09/19   Rolm Bookbinder, CNM    Allergies    Bactrim [sulfamethoxazole-trimethoprim], Ibuprofen, and Toradol [ketorolac tromethamine]  Review of Systems   Review of Systems  Constitutional:  Negative for chills and fever.  Respiratory:  Negative for shortness of breath.   Cardiovascular:  Positive for palpitations. Negative for chest pain.   Psychiatric/Behavioral:  Negative for suicidal ideas. The patient is nervous/anxious.   All other systems reviewed and are negative.  Physical Exam Updated Vital Signs BP (!) 149/99 (BP Location: Left Arm)   Pulse 91   Temp 98.4 F (36.9 C) (Oral)   Resp 16   SpO2 100%   Physical Exam Vitals and nursing note reviewed.  Constitutional:      Appearance: She is not ill-appearing.  HENT:     Head: Normocephalic and atraumatic.  Eyes:     Conjunctiva/sclera: Conjunctivae normal.  Cardiovascular:     Rate and Rhythm: Normal rate and regular rhythm.     Pulses: Normal pulses.  Pulmonary:     Effort: Pulmonary effort is normal.     Breath sounds: Normal breath sounds. No wheezing, rhonchi or rales.  Skin:    General: Skin is warm and dry.     Coloration: Skin is not jaundiced.  Neurological:     Mental Status: She is alert.  Psychiatric:        Mood and Affect: Mood is anxious.        Speech: Speech normal.        Behavior: Behavior normal.        Thought Content: Thought content normal. Thought content does not include homicidal or suicidal ideation.    ED Results / Procedures / Treatments   Labs (all labs ordered are listed, but only abnormal results are displayed) Labs Reviewed  CBC WITH DIFFERENTIAL/PLATELET - Abnormal; Notable for the following components:      Result Value   RDW 16.1 (*)    All other components within normal limits  COMPREHENSIVE METABOLIC PANEL - Abnormal; Notable for the following components:   Glucose, Bld 109 (*)    Total Protein 8.5 (*)    AST 13 (*)    Total Bilirubin 0.2 (*)    All other components within normal limits  I-STAT BETA HCG BLOOD, ED ([facility], [facility], [facility] ONLY)    EKG None  Radiology No results found.  Procedures Procedures   Medications Ordered in ED Medications - No data to display  ED Course  I have reviewed the triage vital signs and the nursing notes.  Pertinent labs & imaging results that were available during my  care of the patient were reviewed by me and considered in my medical decision making (see chart for details).    MDM Rules/Calculators/A&P  35 year old female who presents to the ED today with complaint of anxiety attack that occurred earlier tonight.  Also complains of high blood pressure, supposed to be on amlodipine 5 mg however stopped taking it about 2 months ago.  On arrival to the ED patient's blood pressure with EMS was apparently 180/130.  When she arrived in the ED it was 145/116 with repeat 149/99.  It was initially tachycardic on arrival however this has dissipated while in the waiting room.  She had screening labs done including a CBC, CMP, beta-hCG which have all returned without any acute findings.  On my exam she is resting comfortably.  She does appear slightly anxious however.  She denies any SI, HI, AVH.  She denies any specific chest pain.  Do feel she can follow-up in the outpatient setting for anxiety.  We will plan to discharge home with hydroxyzine to take as needed.  Patient also instructed to follow-up with her PCP regarding her blood pressure refills.  She is in agreement with plan at this time and stable for discharge.   This note was prepared using Dragon voice recognition software and may include unintentional dictation errors due to the inherent limitations of voice recognition software.   Final Clinical Impression(s) / ED Diagnoses Final diagnoses:  Anxiety  Hypertension, unspecified type    Rx / DC Orders ED Discharge Orders          Ordered    hydrOXYzine (ATARAX/VISTARIL) 25 MG tablet  Every 6 hours        06/18/21 0605             Discharge Instructions      Please follow up with your PCP regarding anxiety as well as  your high blood pressure and to restart your blood pressure medications.   Pick up the medication prescribed today and take as needed for anxiety. Please see additional information on managing anxiety.    Return to the ED for any new/worsening symptoms       Tanda Rockers, PA-C 06/18/21 0606    Charlynne Pander, MD 06/18/21 [phone]

## 2021-06-18 NOTE — ED Triage Notes (Signed)
Patient is complaining of hypertension and anxiety. Patient states it started 2300 tonight. Patient is not treated for hypertension and anxiety. BP 180/130 from EMS.

## 2021-11-30 ENCOUNTER — Ambulatory Visit: Payer: Medicaid Other | Admitting: Obstetrics and Gynecology

## 2021-12-27 ENCOUNTER — Other Ambulatory Visit: Payer: Self-pay

## 2021-12-27 ENCOUNTER — Emergency Department (HOSPITAL_COMMUNITY)
Admission: EM | Admit: 2021-12-27 | Discharge: 2021-12-27 | Disposition: A | Discharge status: Home / Self Care | Payer: Medicaid Other | Attending: Medical | Admitting: Medical

## 2021-12-27 ENCOUNTER — Encounter (HOSPITAL_COMMUNITY): Payer: Self-pay | Admitting: Emergency Medicine

## 2021-12-27 DIAGNOSIS — I10 Essential (primary) hypertension: Secondary | ICD-10-CM | POA: Diagnosis not present

## 2021-12-27 DIAGNOSIS — G5602 Carpal tunnel syndrome, left upper limb: Secondary | ICD-10-CM | POA: Diagnosis present

## 2021-12-27 DIAGNOSIS — R030 Elevated blood-pressure reading, without diagnosis of hypertension: Secondary | ICD-10-CM

## 2021-12-27 DIAGNOSIS — Z975 Presence of (intrauterine) contraceptive device: Secondary | ICD-10-CM

## 2021-12-27 NOTE — ED Provider Triage Note (Incomplete)
Emergency Medicine Provider Triage Evaluation Note ? ?Kristi Marshall , a 36 y.o. female  was evaluated in triage.  Pt complains of ***. ? ?Review of Systems  ?Positive: *** ?Negative: *** ? ?Physical Exam  ?There were no vitals taken for this visit. ?Gen:   Awake, no distress  *** ?Resp:  Normal effort *** ?MSK:   Moves extremities without difficulty *** ?Other:  *** ? ?Medical Decision Making  ?Medically screening exam initiated at 11:51 AM.  Appropriate orders placed.  Kristi Marshall was informed that the remainder of the evaluation will be completed by another provider, this initial triage assessment does not replace that evaluation, and the importance of remaining in the ED until their evaluation is complete. ? ?*** ?

## 2021-12-27 NOTE — ED Triage Notes (Signed)
Pt reports tingling in L arm.  States she was told to come here to have Nexplanon removed.  PA assessing pt and checking with [facility]. ?

## 2021-12-27 NOTE — ED Provider Notes (Addendum)
?[facility] ?Provider Note ? ? ?CSN: [id number] ?Arrival date & time: 12/27/21  1146 ? ?  ? ?History ? ?No chief complaint on file. ? ? ?Kristi Marshall is a 36 y.o. female history of hypertension otherwise healthy presented today for removal of her Nexplanon.  Patient reports that she has had a Nexplanon in her left upper arm for a few years, she reports it causes her occasional irritation.  Patient reports that over the past 3 weeks she has noticed a slight tingling sensation around the area she reports that she is also noticed a slight tingling sensation to the palmar side of her left hand, she believes that when she pokes her Nexplanon the tingling in her left hand or worsens.  Patient wanted her Nexplanon removed and she called her OB/GYN but they did not have an appointment until June, she reports that her OB/GYN [facility] her [facility].  She denies fall/injury, fever, chills, swelling/color change, neck pain, extremity weakness, shoulder pain or any additional concerns. ? ?HPI ? ?  ? ?Home Medications ?Prior to Admission medications   ?Medication Sig Start Date End Date Taking? Authorizing Provider  ?hydrOXYzine (ATARAX/VISTARIL) 25 MG tablet Take 1 tablet (25 mg total) by mouth every 6 (six) hours. 06/18/21   Tanda RockersVenter, Margaux, PA-C  ?Prenatal Vit-Fe Fumarate-FA (MULTIVITAMIN-PRENATAL) 27-0.8 MG TABS tablet Take 1 tablet by mouth daily at 12 noon. ?Patient not taking: Reported on 04/15/2021 04/09/19   Rolm BookbinderNeill, Caroline M, CNM  ?   ? ?Allergies    ?Bactrim [sulfamethoxazole-trimethoprim], Ibuprofen, and Toradol [ketorolac tromethamine]   ? ?Review of Systems   ?Review of Systems  ?Constitutional: Negative.  Negative for chills and fever.  ?Musculoskeletal:  Negative for arthralgias and neck pain.  ?Skin:  Negative for color change.  ?Neurological:  Positive for numbness (Tingling). Negative for weakness.  ? ?Physical  Exam ?Updated Vital Signs ?BP (!) 160/112   Pulse 94   Temp 99.2 ?F (37.3 ?C) (Oral)   Resp 14   SpO2 99%  ?Physical Exam ?Constitutional:   ?   General: She is not in acute distress. ?   Appearance: Normal appearance. She is well-developed. She is not ill-appearing or diaphoretic.  ?HENT:  ?   Head: Normocephalic and atraumatic.  ?Eyes:  ?   General: Vision grossly intact. Gaze aligned appropriately.  ?   Pupils: Pupils are equal, round, and reactive to light.  ?Neck:  ?   Trachea: Trachea and phonation normal.  ?Pulmonary:  ?   Effort: Pulmonary effort is normal. No respiratory distress.  ?Abdominal:  ?   General: There is no distension.  ?   Palpations: Abdomen is soft.  ?   Tenderness: There is no abdominal tenderness. There is no guarding or rebound.  ?Musculoskeletal:     ?   General: Normal range of motion.  ?   Cervical back: Normal range of motion.  ?   Comments: No C/T or L-spine tenderness palpation.  No crepitus step-off or deformity the spine.  No paraspinal muscular tense palpation.  Full ROM without pain.  No overlying skin changes or evidence of trauma.  Sensation and capillary refill intact distally.  Full ROM of the left shoulder without pain or increase symptoms.  Full ROM and strength of the left elbow without increased pain or symptoms.  Cubital tunnel is nontender.  Left arm is atraumatic in appearance without swelling or color change.  Nexplanon is noted in the left upper arm without induration or fluctuance or increased warmth.  No overlying skin changes.  Patient is neurovascular intact in all distributions of the left hand, full strength and ROM of the left hand and wrist.  No tenderness to palpation.  Radial pulses intact and equal. ? ?Positive Phalen's test left hand ?Positive Tinel's sign left hand ?Negative Hoffmann sign  ?Skin: ?   General: Skin is warm and dry.  ?Neurological:  ?   Mental Status: She is alert.  ?   GCS: GCS eye subscore is 4. GCS verbal subscore is 5. GCS motor  subscore is 6.  ?   Comments: Speech is clear and goal oriented, follows commands ?Major Cranial nerves without deficit, no facial droop ?Moves extremities without ataxia, coordination intact  ?Psychiatric:     ?   Behavior: Behavior normal.  ? ? ?ED Results / Procedures / Treatments   ?Labs ?(all labs ordered are listed, but only abnormal results are displayed) ?Labs Reviewed - No data to display ? ?EKG ?None ? ?Radiology ?No results found. ? ?Procedures ?Procedures  ? ? ?Medications Ordered in ED ?Medications - No data to display ? ?ED Course/ Medical Decision Making/ A&P ?  ?                        ?Medical Decision Making ?36 year old female presented in hopes to get her Nexplanon taken out of her left upper arm.  She reports that she has noticed a slight tingling sensation to the area over the past few weeks, she also reports tingling sensation to the palmar aspect of her left hand primarily in the thumb, index finger and middle finger.  On examination there is no evidence for infection around the Nexplanon.  She has good range of motion and strength with all movements of the left upper extremity including the cervical spine without pain.  Patient has a reassuring neurologic examination today, no evidence for CVA or myelopathy.  Additionally no evidence for cellulitis, septic joint, compartment syndrome, abscess, DVT, neurovascular compromise or other emergent medical condition..  Patient does have a positive Tinel's sign and a positive Phalen's test, this would be suggestive of carpal tunnel syndrome as cause of the tingling sensation in her left hand.  Patient was provided with a volar wrist brace today to help with this and referred to on-call orthopedic hand specialist Dr. Leanne Chang. ? ?Patient does report some tingling around the Nexplanon that has been in place for a few years could be irritating the median nerve. I called [facility] provider [facility] who advised that they do not take out Nexplanon sent here  and that patient should call her OB/GYN. ?- ?I consulted with attending physician Dr. Jeraldine Loots who agreed with plan of volar brace and outpatient follow-up for this problem.  No negation for imaging or further ER work-up at this time. ?=========== ?Incidentally patient noted to be hypertensive in the ER today, patient was informed of this and to have it rechecked by her PCP this week to ensure improvement.  Signs or symptoms of hypertensive urgency/emergency discussed with patient and she is informed to return to the ER if they occur.  Patient reports she did not take her blood pressure medication this morning, I encouraged her to take it when she gets home.  Patient currently asymptomatic regarding elevated blood pressure reading.  ? ? ? ?At this time there does not appear to be any evidence of an  acute emergency medical condition and the patient appears stable for discharge with appropriate outpatient follow up. Diagnosis was discussed with patient who verbalizes understanding of care plan and is agreeable to discharge. I have discussed return precautions with patient who verbalizes understanding. Patient encouraged to follow-up with their PCP and hand specialist. All questions answered. ? ?Patient's case discussed with Dr. Jeraldine Loots who agrees with plan to discharge with follow-up.  ? ?Note: Portions of this report may have been transcribed using voice recognition software. Every effort was made to ensure accuracy; however, inadvertent computerized transcription errors may still be present. ? ? ? ? ? ? ? ? ?Final Clinical Impression(s) / ED Diagnoses ?Final diagnoses:  ?Nexplanon in place  ?Median nerve compression at elbow, left  ?Elevated blood pressure reading  ? ? ?Rx / DC Orders ?ED Discharge Orders   ? ? None  ? ?  ? ? ?  ?Bill Salinas, PA-C ?12/27/21 1231 ? ?  ?Bill Salinas, PA-C ?12/27/21 1233 ? ?  ?Gerhard Munch, MD ?12/27/21 1642 ? ?

## 2021-12-27 NOTE — ED Notes (Signed)
Pt being discharged by PA at triage. 

## 2021-12-27 NOTE — Discharge Instructions (Signed)
At this time there does not appear to be the presence of an emergent medical condition, however there is always the potential for conditions to change. Please read and follow the below instructions. ? ?Please return to the Emergency Department immediately for any new or worsening symptoms. ?Please be sure to follow up with your Primary Care Provider within one week regarding your visit today; please call their office to schedule an appointment even if you are feeling better for a follow-up visit. ?Please call the on-call hand specialist Dr. Tempie Donning for further evaluation of the tingling sensation in your left hand and possible carpal tunnel syndrome.  You may use the wrist brace to help with this problem. ?Please call your OB/GYN tomorrow morning to schedule follow-up appointment for possible removal of your Nexplanon. ?Your blood pressure was elevated in the ER today, please have this rechecked by your primary care provider within 1 week to ensure improvement. ? ?Go to the nearest Emergency Department immediately if: ?You have fever or chills ?You get a very bad headache. ?You start to feel mixed up (confused). ?You feel weak or numb. ?You feel faint. ?You have very bad pain in your: ?Chest. ?Belly (abdomen). ?You vomit more than once. ?You have trouble breathing. ?You have any new/concerning or worsening of symptoms. ? ? ?Please read the additional information packets attached to your discharge summary. ? ?Do not take your medicine if  develop an itchy rash, swelling in your mouth or lips, or difficulty breathing; call 911 and seek immediate emergency medical attention if this occurs. ? ?You may review your lab tests and imaging results in their entirety on your MyChart account.  Please discuss all results of fully with your primary care provider and other specialist at your follow-up visit. ? ?Note: Portions of this text may have been transcribed using voice recognition software. Every effort was made to ensure  accuracy; however, inadvertent computerized transcription errors may still be present. ? ?

## 2022-02-16 ENCOUNTER — Ambulatory Visit: Payer: Medicaid Other | Admitting: Family Medicine

## 2023-04-30 ENCOUNTER — Ambulatory Visit (HOSPITAL_COMMUNITY)
Admission: EM | Admit: 2023-04-30 | Discharge: 2023-04-30 | Disposition: A | Discharge status: Home / Self Care | Payer: MEDICAID

## 2023-04-30 ENCOUNTER — Encounter (HOSPITAL_COMMUNITY): Payer: Self-pay

## 2023-04-30 DIAGNOSIS — K047 Periapical abscess without sinus: Secondary | ICD-10-CM | POA: Diagnosis not present

## 2023-04-30 DIAGNOSIS — I10 Essential (primary) hypertension: Secondary | ICD-10-CM

## 2023-04-30 DIAGNOSIS — R220 Localized swelling, mass and lump, head: Secondary | ICD-10-CM | POA: Diagnosis not present

## 2023-04-30 HISTORY — DX: Unspecified osteoarthritis, unspecified site: M19.90

## 2023-04-30 HISTORY — DX: Fibromyalgia: M79.7

## 2023-04-30 MED ORDER — AMOXICILLIN-POT CLAVULANATE 875-125 MG PO TABS: 1.0000 | ORAL_TABLET | Freq: Two times a day (BID) | ORAL | 0 refills | Status: AC

## 2023-04-30 NOTE — ED Provider Notes (Signed)
[facility]    CSN: [id number] Arrival date & time: 04/30/23  1407      History   Chief Complaint Chief Complaint  Patient presents with   Dental Problem   Abscess    HPI Kristi Marshall is a 37 y.o. female.   Patient presents today with 2-day history of left lower jaw swelling with associated dentalgia.  She reports pain is rated 9 on a 0-10 pain scale, described as throbbing, no aggravating or alleviating factors identified.  She has a known issue in this area but has not seen a dentist recently.  She did call and schedule appointment will see them first thing this week but could not tolerate the pain any further prompting evaluation.  She denies any recent antibiotics.  She has tried over-the-counter medications including Tylenol and ibuprofen with minimal improvement of symptoms.  She has been able to drink without difficulty but is having difficulty with mastication secondary to pain so has been subsisting primarily on soft/liquid foods.  She denies any fever, nausea, vomiting.  Has no concern for pregnancy.  Denies any swelling of her throat, shortness of breath, muffled voice.  Blood pressure is elevated today.  Patient reports that she is taking blood pressure medication but does not member the name of this.  She has follow-up appointment scheduled with her primary care in the near future and would prefer to have them adjust her medication.  She denies any chest pain, shortness of breath, visual disturbance, headache, dizziness.  She denies any increase sodium or caffeine consumption.  Has not been taking NSAIDs or decongestants regularly.    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 retained POC     OB History     Gravida  10   Para  7   Term  6   Preterm  1   AB  3   Living  7      SAB  0   IAB  3   Ectopic  0   Multiple  0   Live Births  7            Home Medications    Prior to Admission medications   Medication Sig Start Date End Date Taking? Authorizing Provider  amoxicillin-clavulanate (AUGMENTIN) 875-125 MG tablet Take 1 tablet by mouth every 12 (twelve) hours. 04/30/23  Yes Diem Dicocco, Noberto Retort, PA-C  Buprenorphine HCl-Naloxone HCl 8-2 MG FILM Place under the tongue. 04/27/23  Yes [provider]  hydrOXYzine (ATARAX/VISTARIL) 25 MG tablet Take 1 tablet (25 mg total) by mouth every 6 (six) hours. 06/18/21  Yes Venter, Margaux, PA-C  WELLBUTRIN XL 150 MG 24 hr tablet Take 150 mg by mouth daily. 03/28/23  Yes [provider]    Family History Family History  Problem Relation Age of Onset   Hypertension Mother    Diabetes Maternal Aunt    Hypertension Maternal Grandmother    Diabetes Maternal Grandmother    Heart disease Maternal Grandmother    Hypertension Maternal Grandfather    Heart disease Maternal Grandfather    Anesthesia problems Neg Hx    Hypotension Neg Hx    Malignant hyperthermia Neg Hx     Pseudochol deficiency Neg Hx    Other Neg Hx    Hearing loss Neg Hx     Social History Social History   Tobacco Use   Smoking status: Former    Current packs/day: 0.00    Types: Cigarettes    Start date: 05/24/2009    Quit date: 05/24/2016    Years since quitting: 6.9   Smokeless tobacco: Never   Tobacco comments:    Second hand smoke  Vaping Use   Vaping status: Never Used  Substance Use Topics   Alcohol use: Yes   Drug use: No    Comment: Took Oycodone for years     Allergies   Bactrim [sulfamethoxazole-trimethoprim], Toradol [ketorolac tromethamine], and Ibuprofen   Review of Systems Review of Systems  Constitutional:  Positive for activity change. Negative for appetite change, fatigue and fever.  HENT:  Positive for dental problem and facial swelling. Negative for congestion, sinus pressure, sneezing, sore throat and trouble swallowing.   Respiratory:  Negative for cough and shortness of breath.   Cardiovascular:  Negative for chest pain.  Gastrointestinal:  Negative for abdominal pain, diarrhea, nausea and vomiting.     Physical Exam Triage Vital Signs ED Triage Vitals  Encounter Vitals Group     BP 04/30/23 1455 (!) 150/109     Systolic BP Percentile --      Diastolic BP Percentile --      Pulse Rate 04/30/23 1455 91     Resp 04/30/23 1455 18     Temp 04/30/23 1455 98.8 F (37.1 C)     Temp Source 04/30/23 1455 Oral     SpO2 04/30/23 1455 96 %     Weight 04/30/23 1454 190 lb 0.6 oz (86.2 kg)     Height 04/30/23 1454 5\' 4"  (1.626 m)     Head Circumference --      Peak Flow --      Pain Score 04/30/23 1451 9     Pain Loc --      Pain Education --      Exclude from Growth Chart --    No data found.  Updated Vital Signs BP (!) 150/109 (BP Location: Right Arm)   Pulse 91   Temp 98.8 F (37.1 C) (Oral)   Resp 18   Ht 5\' 4"  (1.626 m)   Wt 190 lb 0.6 oz (86.2 kg)   LMP 04/24/2023 (Approximate)   SpO2 96%   Breastfeeding No   BMI 32.62 kg/m    Visual Acuity Right Eye Distance:   Left Eye Distance:   Bilateral Distance:    Right Eye Near:   Left Eye Near:    Bilateral Near:  Physical Exam Vitals reviewed.  Constitutional:      General: She is awake. She is not in acute distress.    Appearance: Normal appearance. She is well-developed. She is not ill-appearing.     Comments: Very pleasant female appears stated age in no acute distress sitting comfortably in exam room  HENT:     Head: Normocephalic and atraumatic.     Jaw: Swelling present.      Mouth/Throat:     Dentition: Abnormal dentition. Gingival swelling and dental abscesses present.      Comments: No evidence of Ludwig angina Cardiovascular:     Rate and Rhythm: Normal rate and regular rhythm.     Heart sounds: Normal heart sounds, S1 normal and S2 normal. No murmur heard. Pulmonary:     Effort: Pulmonary effort is normal.     Breath sounds: Normal breath sounds. No wheezing, rhonchi or rales.     Comments: Clear to auscultation bilaterally Psychiatric:        Behavior: Behavior is cooperative.      UC Treatments / Results  Labs (all labs ordered are listed, but only abnormal results are displayed) Labs Reviewed - No data to display  EKG   Radiology No results found.  Procedures Procedures (including critical care time)  Medications Ordered in UC Medications - No data to display  Initial Impression / Assessment and Plan / UC Course  I have reviewed the triage vital signs and the nursing notes.  Pertinent labs & imaging results that were available during my care of the patient were reviewed by me and considered in my medical decision making (see chart for details).     Patient is well-appearing, afebrile, nontoxic, nontachycardic.  She was started on Augmentin twice daily for 10 days.  Recommend she use warm compresses and gargles warm salt water to encourage drainage.  She has an appointment scheduled with dentist first thing Monday  morning and was strongly encouraged to keep this appointment.  She can use over-the-counter analgesics as needed for pain relief.  We discussed that if she has any worsening or changing symptoms including swelling of her throat, muffled voice, shortness of breath, dysphagia she needs to be seen emergently.  Strict return precautions given.  Work excuse note provided.  Blood pressure is elevated today but patient denies any signs/symptoms of endorgan damage.  We did discuss the importance of close follow-up since she did not want to have as initiate or titrate her medication today.  She will follow-up with her primary care soon as possible.  Recommend she avoid NSAIDs, decongestants, caffeine, sodium.  Discussed that if she develops any chest pain, shortness of breath, headache, vision change, dizziness in the setting of high blood pressure she needs to go to the emergency room.  Strict return precautions given.  Final Clinical Impressions(s) / UC Diagnoses   Final diagnoses:  Dental abscess  Facial swelling  Elevated blood pressure reading with diagnosis of hypertension     Discharge Instructions      Start Augmentin twice daily for 10 days to cover for dental infection.  Gargle with warm salt water and use warm compresses to encourage drainage.  Follow-up with a dentist on Monday as we discussed.  If anything worsens and you have increasing swelling, pain, swelling of her throat, difficulty swallowing, fever, nausea, vomiting you need to be seen immediately.  Your blood pressure is elevated.  Please follow-up with primary care.  Avoid decongestants, caffeine, sodium, NSAIDs (aspirin, ibuprofen/Advil, naproxen/Aleve).  If you develop any chest pain, shortness of breath, headache, vision change, dizziness in the setting of high blood pressure you need to be seen immediately.     ED Prescriptions     Medication Sig Dispense Auth. Provider   amoxicillin-clavulanate (AUGMENTIN) 875-125 MG  tablet Take 1 tablet by mouth every 12 (twelve) hours. 20 tablet Gisell Buehrle, Noberto Retort, PA-C      PDMP not reviewed this encounter.   Jeani Hawking, PA-C 04/30/23 1538

## 2023-04-30 NOTE — Discharge Instructions (Signed)
Start Augmentin twice daily for 10 days to cover for dental infection.  Gargle with warm salt water and use warm compresses to encourage drainage.  Follow-up with a dentist on Monday as we discussed.  If anything worsens and you have increasing swelling, pain, swelling of her throat, difficulty swallowing, fever, nausea, vomiting you need to be seen immediately.  Your blood pressure is elevated.  Please follow-up with primary care.  Avoid decongestants, caffeine, sodium, NSAIDs (aspirin, ibuprofen/Advil, naproxen/Aleve).  If you develop any chest pain, shortness of breath, headache, vision change, dizziness in the setting of high blood pressure you need to be seen immediately.

## 2023-04-30 NOTE — ED Triage Notes (Signed)
dental abscess on the left bottom jaw. Onset 2 days ago. The tooth has been a problem itself for 4 days.

## 2023-06-14 ENCOUNTER — Ambulatory Visit: Payer: MEDICAID | Admitting: Obstetrics and Gynecology

## 2024-06-18 ENCOUNTER — Encounter: Payer: Self-pay | Admitting: Family Medicine

## 2024-06-22 ENCOUNTER — Ambulatory Visit: Payer: MEDICAID | Admitting: Family Medicine

## 2024-10-18 ENCOUNTER — Ambulatory Visit: Payer: Self-pay
